# Patient Record
Sex: Female | Born: 1935 | Race: White | Hispanic: No | Marital: Married | Smoking: Never smoker
Health system: Southern US, Academic
[De-identification: ages and names within clinical notes are randomized; demographics above are authoritative.]

## PROBLEM LIST (undated history)

## (undated) DIAGNOSIS — I10 Essential (primary) hypertension: Secondary | ICD-10-CM

## (undated) DIAGNOSIS — M1990 Unspecified osteoarthritis, unspecified site: Secondary | ICD-10-CM

## (undated) HISTORY — DX: Essential (primary) hypertension: I10

## (undated) HISTORY — PX: KNEE SURGERY: SHX244

## (undated) HISTORY — DX: Unspecified osteoarthritis, unspecified site: M19.90

## (undated) SURGERY — IR STROKE THERAPY
Anesthesia: General | Laterality: Right

---

## 1994-05-27 ENCOUNTER — Ambulatory Visit (HOSPITAL_COMMUNITY): Payer: Self-pay

## 2015-06-18 IMAGING — US US EXTREM LOW VENOUS*L*
1 series · 14 of 24 positions shown · non-contrast
Comparison: None.

CLINICAL DATA: Left ankle pain and swelling

EXAM:
Left LOWER EXTREMITY VENOUS DOPPLER ULTRASOUND
TECHNIQUE: Gray-scale sonography with graded compression, as well as color
Doppler and duplex ultrasound, were performed to evaluate the deep
venous system from the level of the common femoral vein through the
popliteal and proximal calf veins. Spectral Doppler was utilized to
evaluate flow at rest and with distal augmentation maneuvers.

[Series 1: us extrem low venous*left* · 14 of 26 slices shown]
[im 1/26]
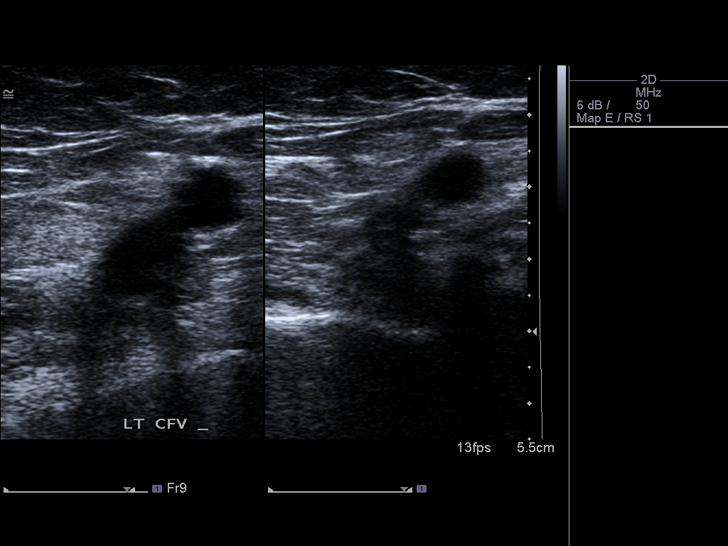
[im 3/26]
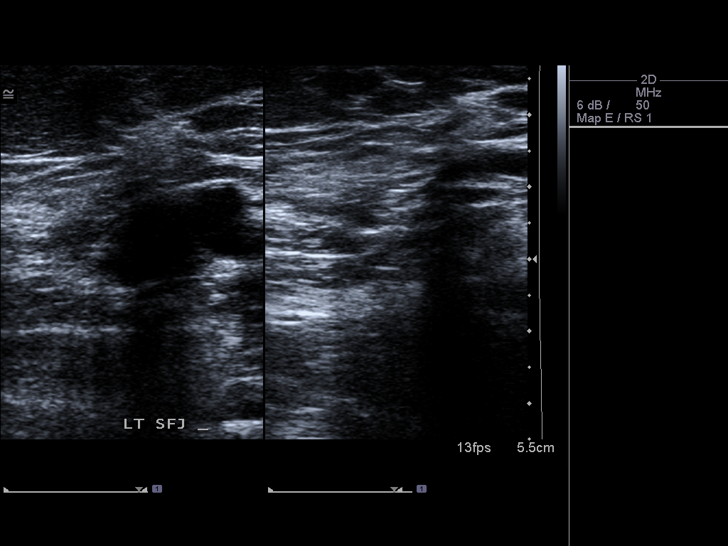
[im 5/26]
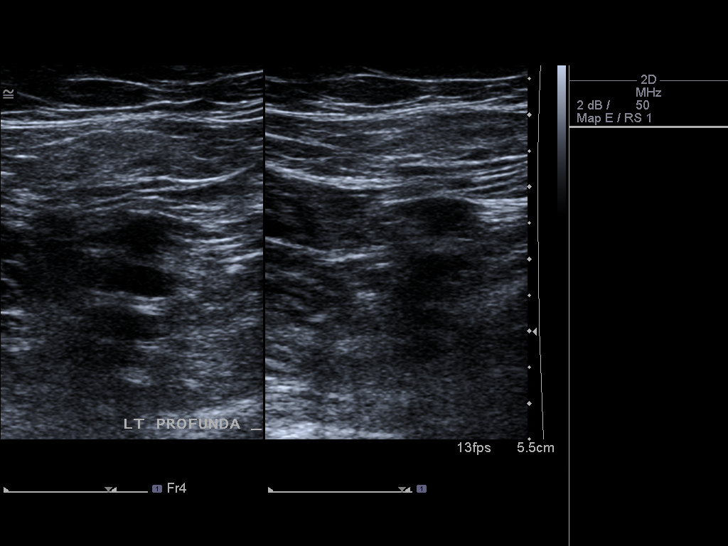
[im 7/26]
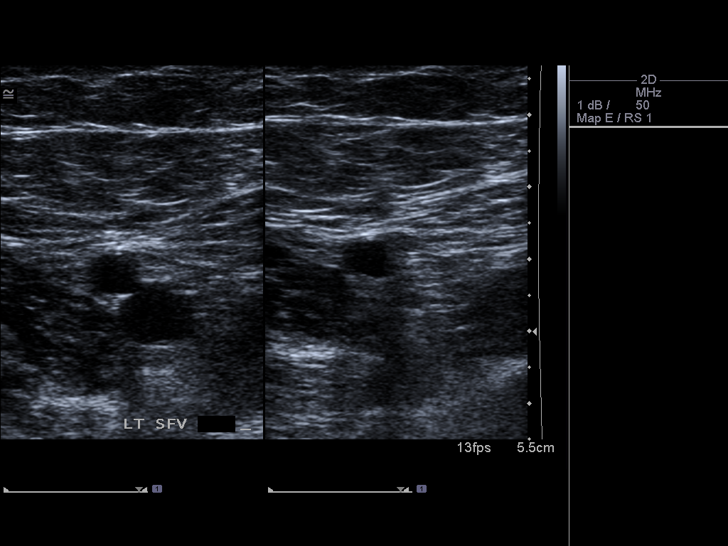
[im 8/26]
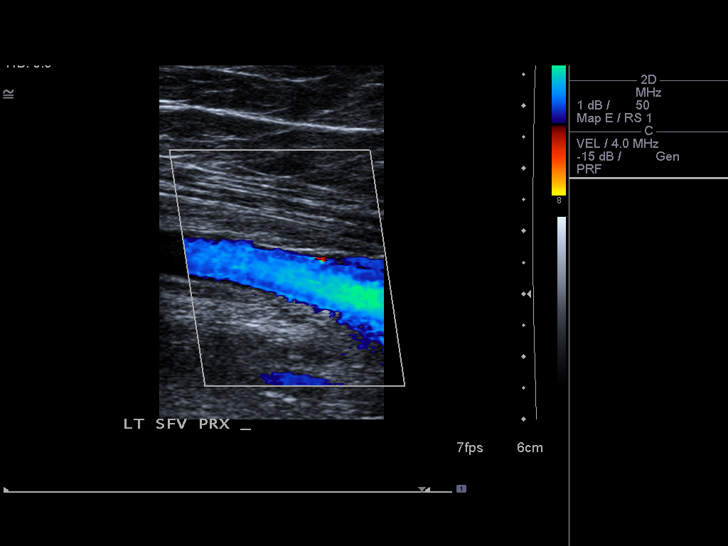
[im 10/26]
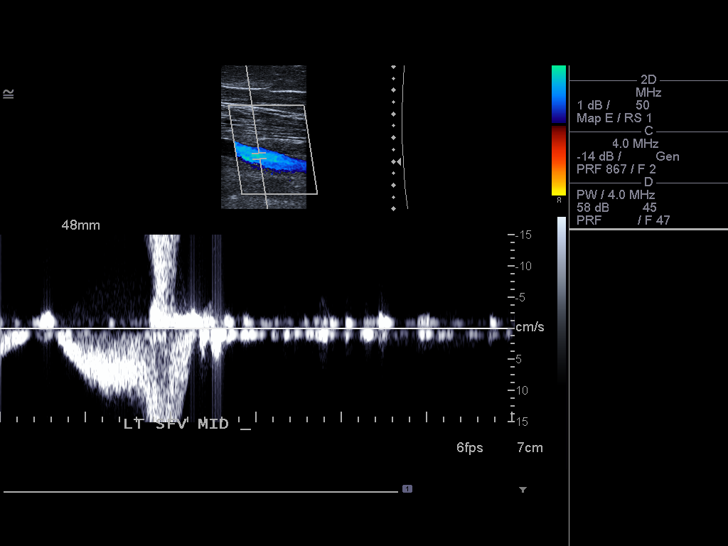
[im 12/26]
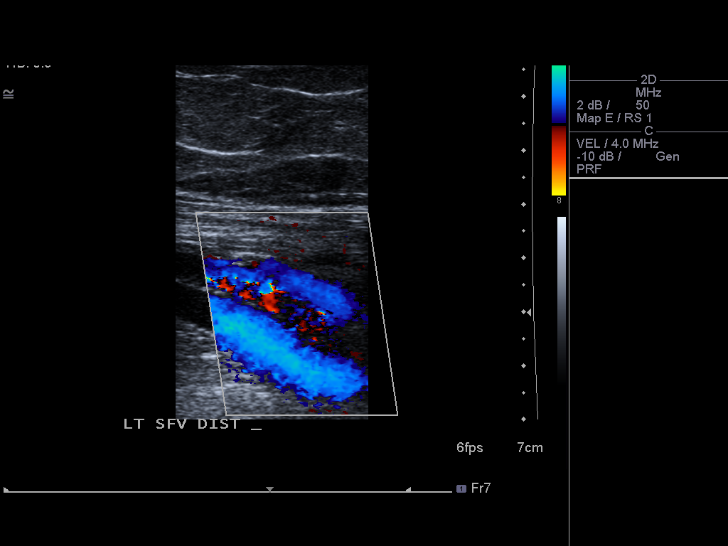
[im 14/26]
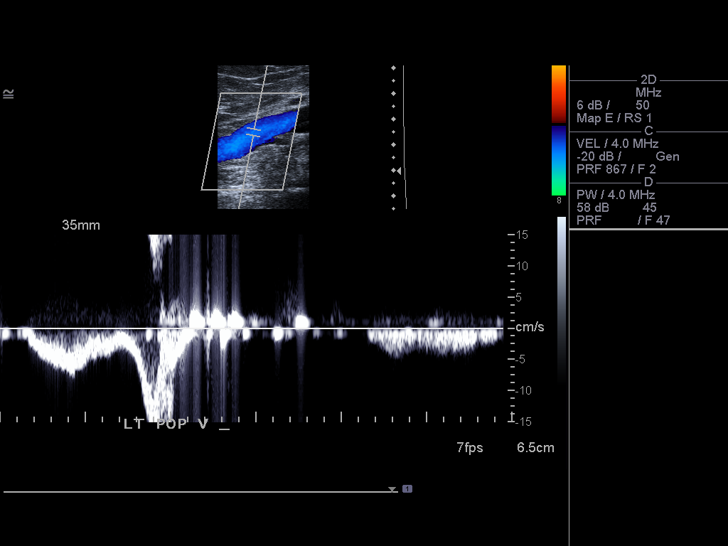
[im 16/26]
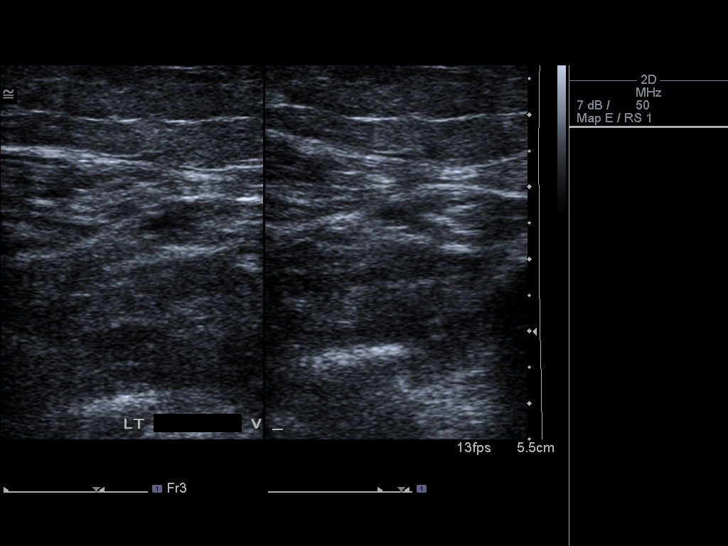
[im 18/26]
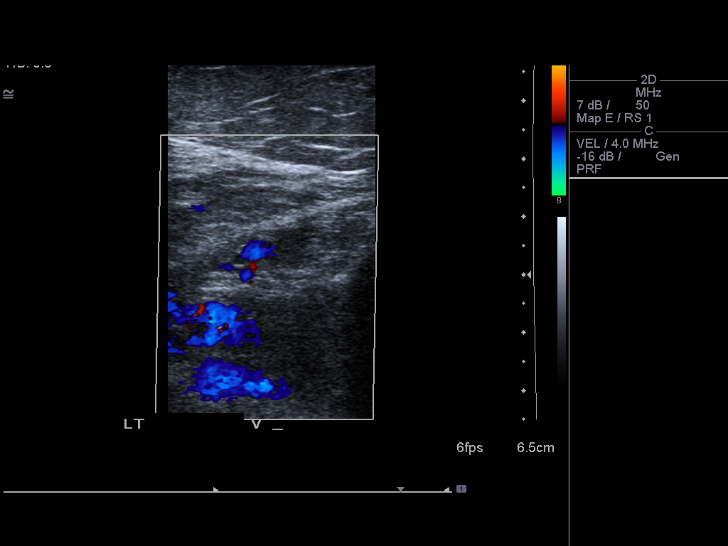
[im 20/26]
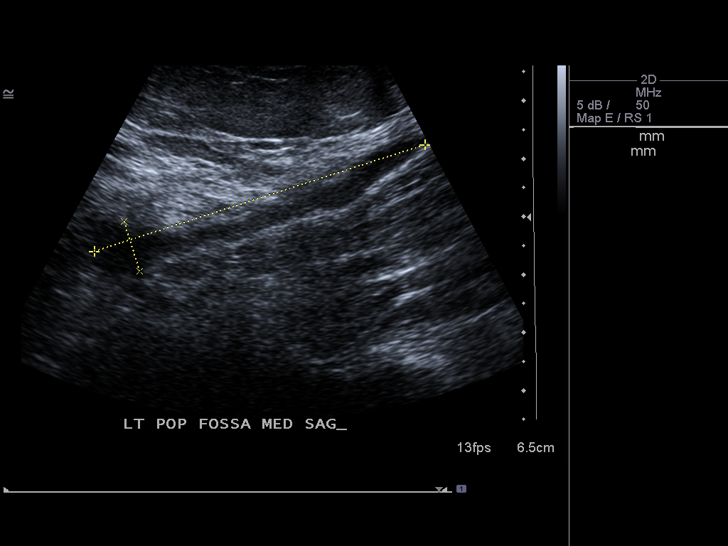
[im 21/26]
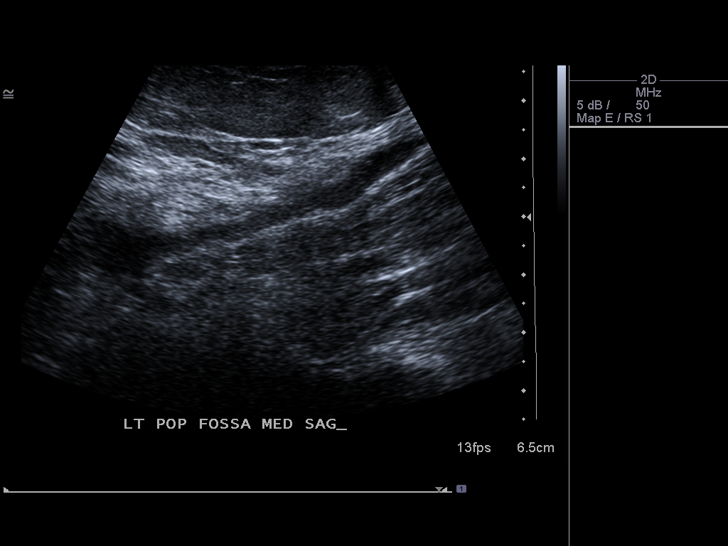
[im 23/26]
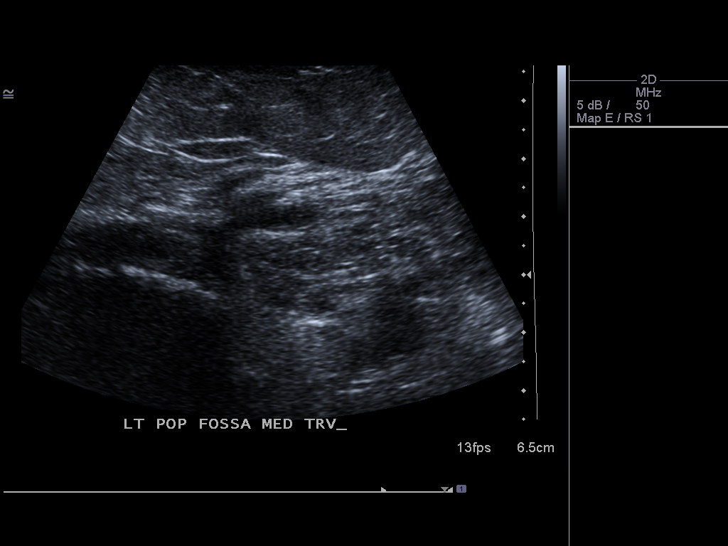
[im 26/26]
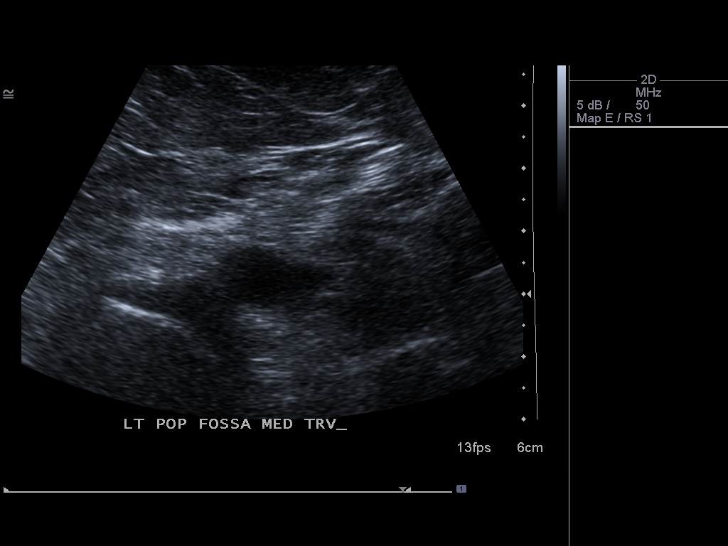

[14 of 24 positions shown; findings below may reference images not displayed]

FINDINGS: Thrombus within deep veins:  None visualized.

Compressibility of deep veins:  Normal.

Duplex waveform respiratory phasicity:  Normal.

Duplex waveform response to augmentation:  Normal.

Venous reflux:  None visualized.

Other findings: The greater saphenous vein has been stripped
previously stripped. Additionally a hypoechoic area is noted in the
popliteal fossa consistent with a popliteal cyst.
IMPRESSION: No evidence of deep venous thrombosis.

Prior greater saphenous vein stripping.

6 cm collection in the popliteal fossa consistent with a popliteal
cyst.

## 2015-06-18 IMAGING — CR DG ANKLE COMPLETE 3+V*L*
3 series · 3 of 3 positions shown · non-contrast
Comparison: None.

CLINICAL DATA: Ankle swelling

EXAM:
LEFT ANKLE COMPLETE - 3+ VIEW

[t ankle joint ap left]
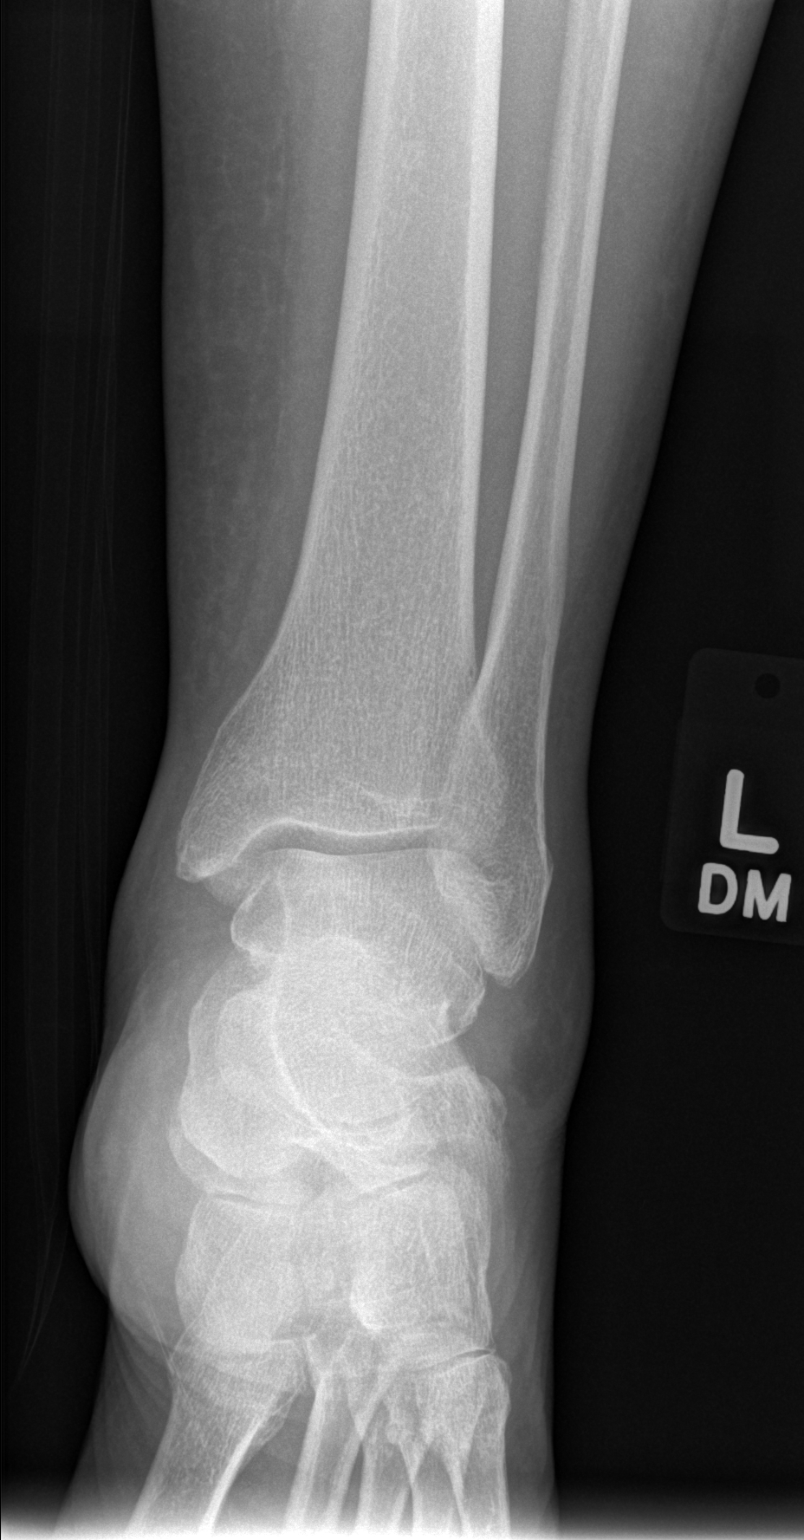

[t ankle joint oblique left]
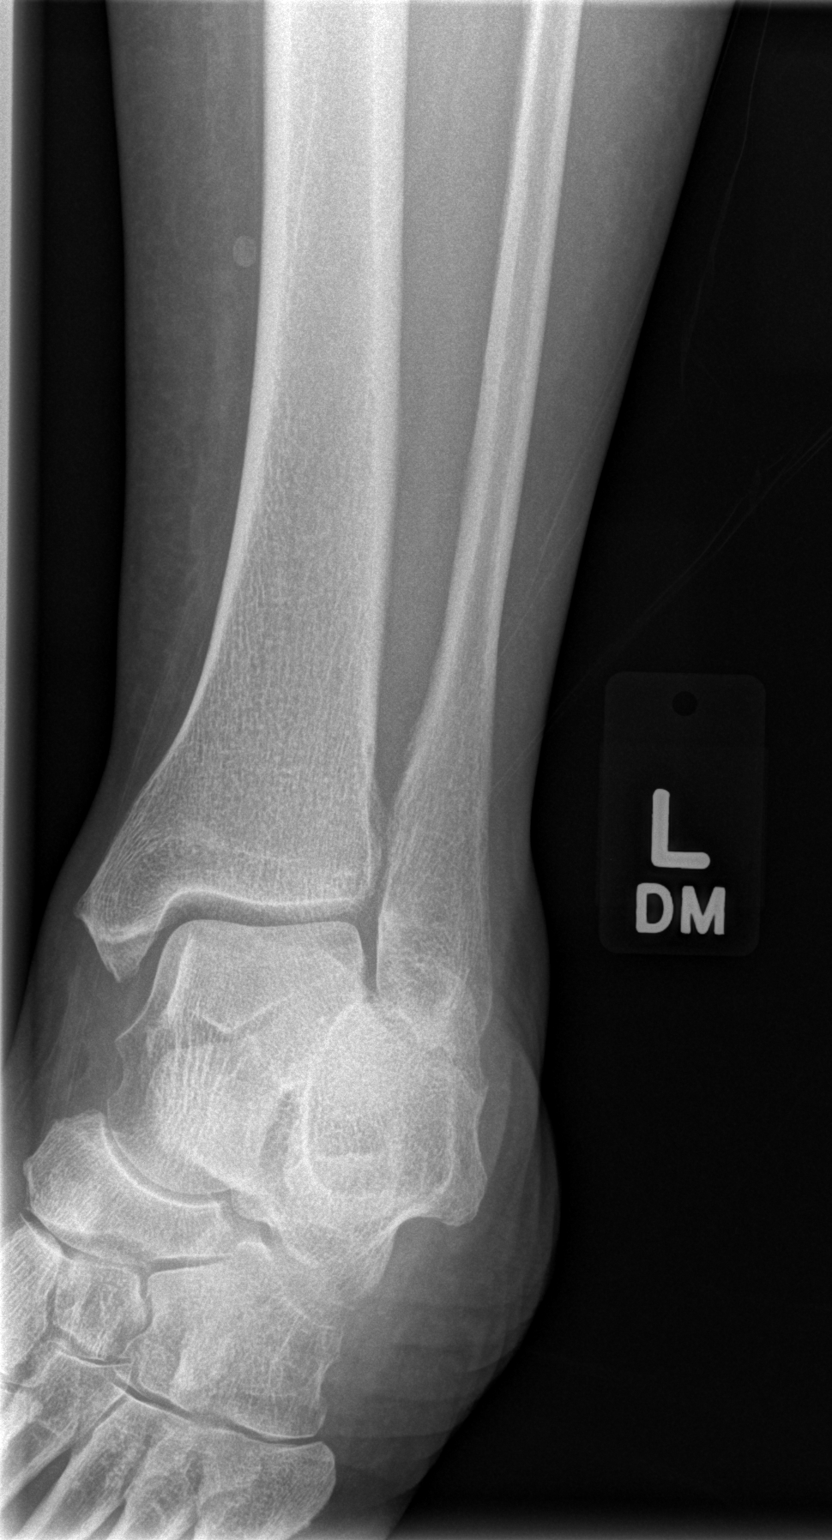

[t ankle joint lat left]
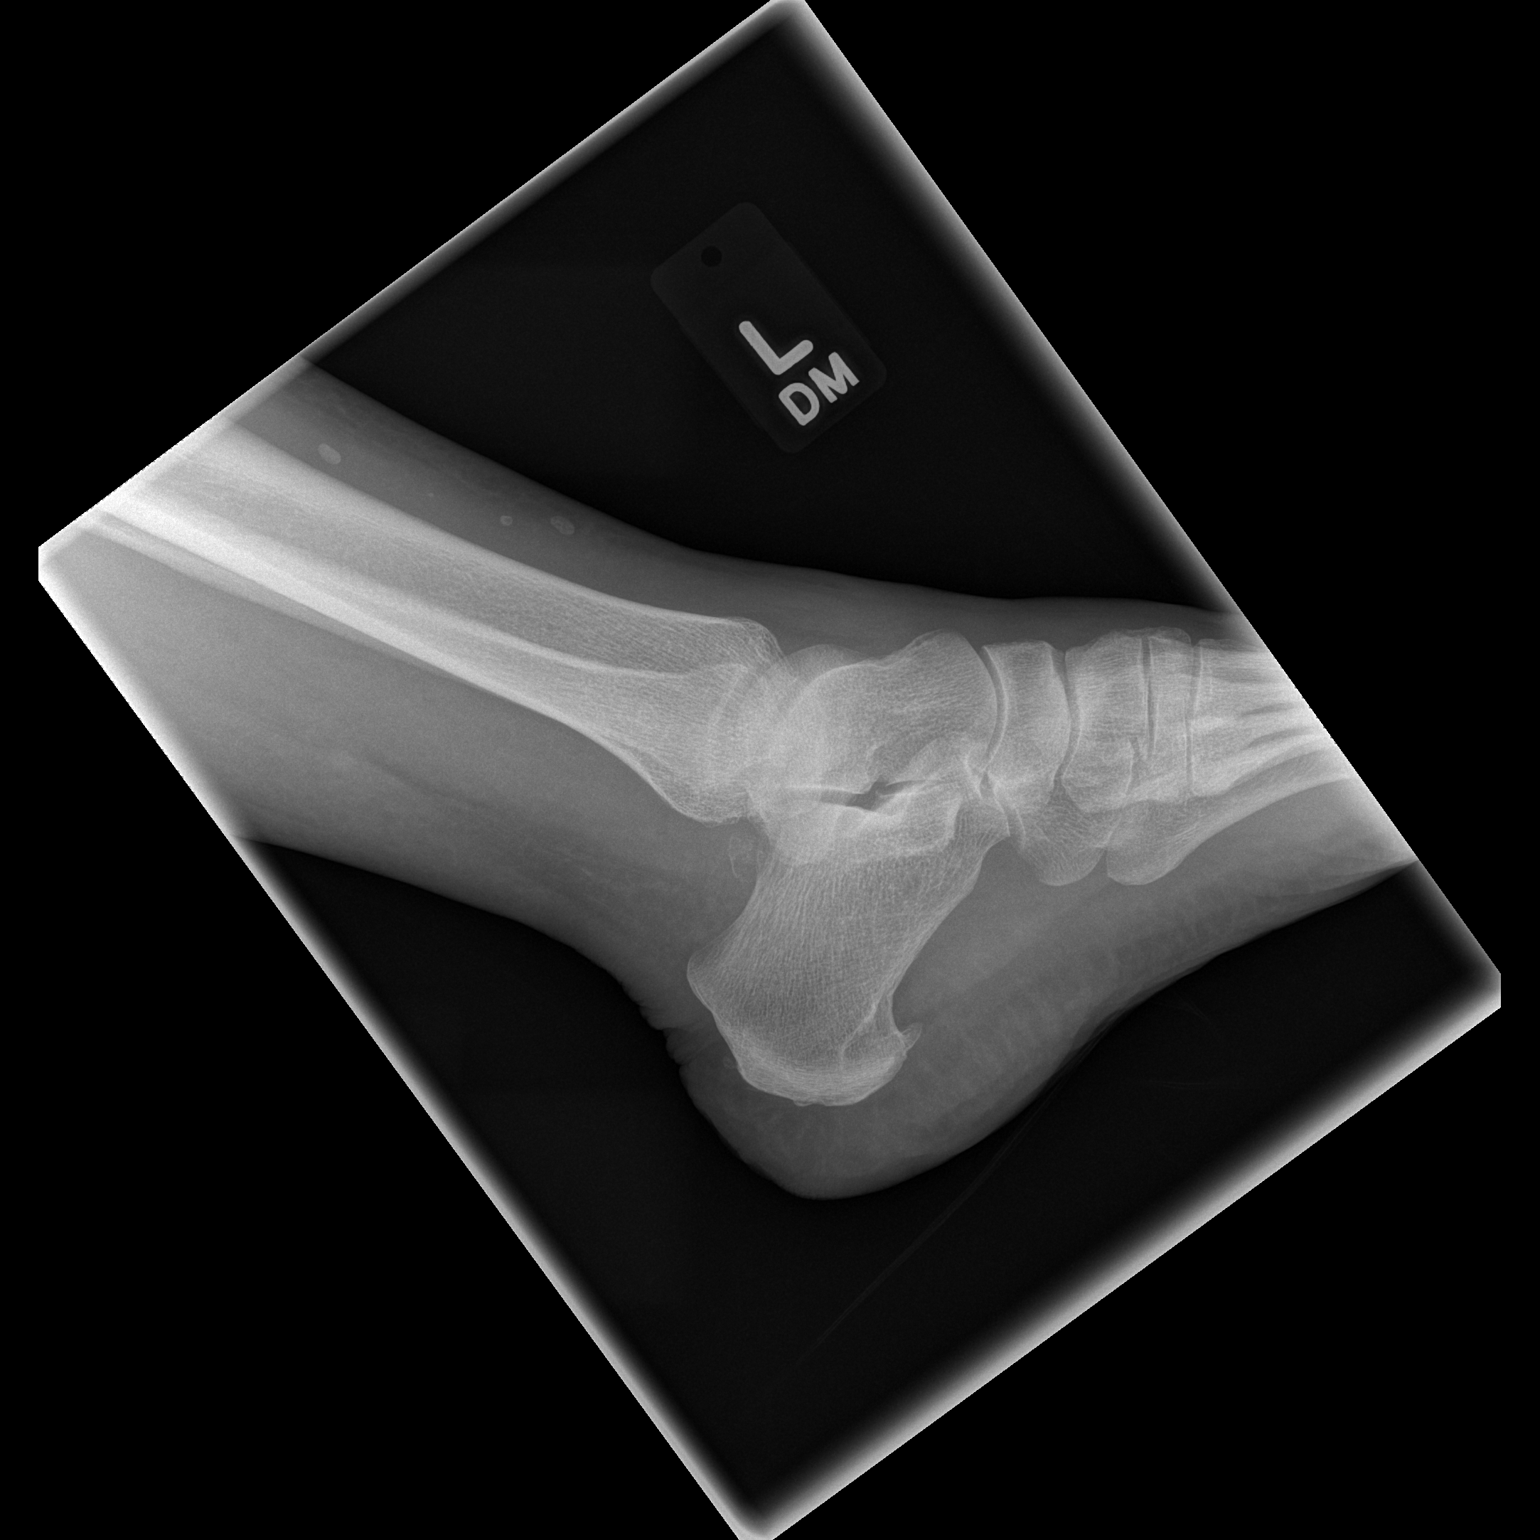

[3 of 3 positions shown; findings below may reference images not displayed]

FINDINGS: No acute fracture dislocation is noted. Generalized soft tissue
swelling is noted. Some phleboliths are seen within the venous
system.
IMPRESSION: No acute bony abnormality.  Soft tissue swelling is seen.

## 2019-09-14 ENCOUNTER — Other Ambulatory Visit (HOSPITAL_COMMUNITY): Payer: Self-pay | Admitting: Internal Medicine

## 2019-09-14 ENCOUNTER — Other Ambulatory Visit: Payer: Self-pay

## 2019-09-14 ENCOUNTER — Ambulatory Visit
Admission: RE | Admit: 2019-09-14 | Discharge: 2019-09-14 | Disposition: A | Discharge status: Home / Self Care | Payer: Medicare (Managed Care) | Source: Ambulatory Visit | Attending: Internal Medicine | Admitting: Internal Medicine

## 2019-09-14 DIAGNOSIS — R0789 Other chest pain: Secondary | ICD-10-CM

## 2019-11-01 ENCOUNTER — Other Ambulatory Visit (HOSPITAL_COMMUNITY): Payer: Self-pay | Admitting: Internal Medicine

## 2019-11-01 DIAGNOSIS — Z1231 Encounter for screening mammogram for malignant neoplasm of breast: Secondary | ICD-10-CM

## 2019-12-29 ENCOUNTER — Ambulatory Visit (HOSPITAL_BASED_OUTPATIENT_CLINIC_OR_DEPARTMENT_OTHER)
Admission: RE | Admit: 2019-12-29 | Discharge: 2019-12-29 | Disposition: A | Discharge status: Home / Self Care | Payer: Medicare (Managed Care) | Source: Ambulatory Visit | Attending: OBSTETRICS-GYNECOLOGY | Admitting: OBSTETRICS-GYNECOLOGY

## 2019-12-29 ENCOUNTER — Encounter (HOSPITAL_BASED_OUTPATIENT_CLINIC_OR_DEPARTMENT_OTHER): Payer: Self-pay

## 2019-12-29 ENCOUNTER — Other Ambulatory Visit (HOSPITAL_COMMUNITY): Payer: Self-pay | Admitting: OBSTETRICS-GYNECOLOGY

## 2019-12-29 ENCOUNTER — Other Ambulatory Visit: Payer: Self-pay

## 2019-12-29 ENCOUNTER — Ambulatory Visit
Admission: RE | Admit: 2019-12-29 | Discharge: 2019-12-29 | Disposition: A | Discharge status: Home / Self Care | Payer: Medicare (Managed Care) | Source: Ambulatory Visit | Attending: Internal Medicine | Admitting: Internal Medicine

## 2019-12-29 DIAGNOSIS — M810 Age-related osteoporosis without current pathological fracture: Secondary | ICD-10-CM | POA: Insufficient documentation

## 2019-12-29 DIAGNOSIS — Z1231 Encounter for screening mammogram for malignant neoplasm of breast: Secondary | ICD-10-CM | POA: Insufficient documentation

## 2019-12-29 DIAGNOSIS — M859 Disorder of bone density and structure, unspecified: Secondary | ICD-10-CM

## 2020-07-12 ENCOUNTER — Ambulatory Visit (HOSPITAL_COMMUNITY): Payer: Medicare (Managed Care) | Admitting: ORTHOPAEDIC SURGERY

## 2020-07-12 ENCOUNTER — Other Ambulatory Visit: Payer: Self-pay

## 2020-07-12 ENCOUNTER — Encounter (HOSPITAL_COMMUNITY): Payer: Self-pay | Admitting: ORTHOPAEDIC SURGERY

## 2020-07-12 ENCOUNTER — Ambulatory Visit
Admission: RE | Admit: 2020-07-12 | Discharge: 2020-07-12 | Disposition: A | Discharge status: Home / Self Care | Payer: Medicare (Managed Care) | Source: Ambulatory Visit | Attending: ORTHOPAEDIC SURGERY | Admitting: ORTHOPAEDIC SURGERY

## 2020-07-12 VITALS — Temp 96.4°F | Ht 64.0 in | Wt 164.0 lb

## 2020-07-12 DIAGNOSIS — M25569 Pain in unspecified knee: Secondary | ICD-10-CM

## 2020-07-12 DIAGNOSIS — M1712 Unilateral primary osteoarthritis, left knee: Secondary | ICD-10-CM

## 2020-07-12 DIAGNOSIS — M25562 Pain in left knee: Secondary | ICD-10-CM

## 2020-07-12 DIAGNOSIS — Z96651 Presence of right artificial knee joint: Secondary | ICD-10-CM | POA: Insufficient documentation

## 2020-07-12 MED ORDER — BETAMETHASONE ACETATE AND SODIUM PHOS 6 MG/ML SUSPENSION FOR INJECTION
6.00 mg | INTRAMUSCULAR | 0 refills | Status: AC
Start: 2020-07-12 — End: ?

## 2020-07-12 NOTE — Procedures (Signed)
ORTHOPEDICS, [facility]  [address] [place] [address]  Operated by [facility]  Procedure Note    Name: Bridget [name] MRN:  [id number]   Date: 07/12/2020 Age: 86 y.o.       Large Joint Arthrocentesis: L knee on 07/12/2020 1:52 PM  Indications: pain  Details: 21 G needle, lateral approach    The knees were sequentially identified.  Starting with the left the area affected was prepped in a sterile fashion using standard technique.  After this the knee was injected.  A sterile bandage was then applied.  The patient tolerated the procedure well.     After the patient said procedure they were instructed to rest and ice the affected extremities.  Ice to be applied 20 minutes on than 40 minutes off for the next several hours.  Patient advised to contact the office if there is any significant redness, drainage, swelling or other abnormalities.  Both our direct line and a line for service was given.  Consent was obtained for the procedure by the patient.    Medication: Celestone, 6mg   Route: Needle injection, Interarticular  Site: Left knee    NDC:  Manufacturer: Pfizer  Supplier: Clinic              [id number], MD

## 2020-07-12 NOTE — Nursing Note (Signed)
07/12/20 1300   Medication Administration   Medication  Celestone   LOT # [id number]   Expiration date 12/26/21

## 2020-07-12 NOTE — Progress Notes (Signed)
[facility]  DEPARTMENT OF ORTHOPAEDICS    Clinic Note       Name: Bridget [name]  MRN: [id number]    Patient ID: Bridget [name] is an 86 y.o. female.  Chief Complaint:     Chief Complaint   Patient presents with   . Knee Pain        HPI/Subjective:    Patient is a very pleasant 86 year old that had undergone a total knee arthroplasty at [facility] with an all poly base plate and a posterior stabilized femoral component.  She did very well with that.  She is thinking she may want to her other side.  At this point she is hoping just for an injection.  Walking tolerance is down to about 15 or 20 minutes.  She has pain with activity and at rest.    She describes the pain as moderately dull with sharp pains during activity.  The pain can be rated at a 4/10.  It is improved by rest, ice, or sleep.  There is a post activity pain - but it is less significant.  Pain is improved by over-the-counter medications.  Pain is made worse by significant or forceful activity. It does occur during ADL and hom eactivities.  The pain does vary from day-to-day.        Past Medical History:   Diagnosis Date   . Arthritis    . HTN (hypertension)          Past Surgical History:   Procedure Laterality Date   . KNEE SURGERY           Family Medical History:     Problem Relation (Age of Onset)    No Known Problems Mother, Father, Sister, Brother, Maternal Grandmother, Maternal Grandfather, Paternal Grandmother, Paternal Grandfather, Daughter, Son, Maternal Aunt, Maternal Uncle, Paternal Aunt, Paternal Uncle, Other           Current Outpatient Medications   Medication Sig   . amLODIPine (NORVASC) 5 mg Oral Tablet TAKE 1 TABLET BY MOUTH EVERY DAY. STOP TAKING THE AMLODIPINE 2.5MG    . betamethasone (CELESTONE SOLUSPAN) 6 mg/mL Injection Suspension Inject 1 mL (6 mg total) as directed Every 24 hours   . levothyroxine (SYNTHROID) 100 mcg Oral Tablet TAKE 1 TABLET BY  MOUTH FIRST THING IN THE MORNING ONE HOUR BEFORE BREAKFAST ON AN EMPTY STOMACH   . pravastatin (PRAVACHOL) 20 mg Oral Tablet    . traZODone (DESYREL) 50 mg Oral Tablet Take 50 mg by mouth Every night     No Known Allergies   Social History     Substance and Sexual Activity   Alcohol Use None     Social History     Tobacco Use   Smoking Status Never Smoker   Smokeless Tobacco Never Used     Social History     Substance and Sexual Activity   Drug Use Not on file       Review of Systems:  Constitutional: Negative for activity change, appetite change, diaphoresis and fatigue.   HENT: Negative for hearing loss, nosebleeds, postnasal drip, rhinorrhea, sinus pain and sore throat.    Eyes: Negative for photophobia, discharge and redness.   Respiratory: Negative for cough, choking, chest tightness, shortness of breath and wheezing.    Cardiovascular: Negative for chest pain, palpitations and leg swelling.   Gastrointestinal: Negative for abdominal distention, constipation, diarrhea, nausea and vomiting.   Genitourinary: Negative for difficulty urinating,  dysuria, hematuria and urgency.   Neurological: Negative for dizziness, seizures, light-headedness and numbness.   Psychiatric/Behavioral: Negative for agitation, confusion, dysphoric mood and sleep disturbance.     Physical Exam  Vitals:    07/12/20 1303   Temp: 35.8 C (96.4 F)   Weight: 74.4 kg (164 lb)   Height: 1.626 m (5\' 4" )   BMI: 28.21      Constitutional:       General: She is not in acute distress.     Appearance: Normal appearance. She is not ill-appearing or diaphoretic.   Eyes:      Extraocular Movements: Extraocular movements intact.      Pupils: Pupils are equal, round, and reactive to light.   Cardiovascular:      Rate and Rhythm: Normal rate and regular rhythm.   Pulmonary:      Effort: Pulmonary effort is normal.      Breath sounds: Normal breath sounds.   Abdominal:      General: Abdomen is flat. There is no distension.      Palpations: Abdomen is soft.       Tenderness: There is no abdominal tenderness.   Skin:     Coloration: Skin is not jaundiced.      Findings: No Rash. No lesion.   Neurological:      Mental Status: She is alert.   Musculoskeletal:      MSK:  Ipsilateral Hip:   Inspection:  No ecchymosis, no effusion, no deformities, no redness, no swelling  Palpation:  No tenderness in groin, no tenderness on bursa.  Range of motion: ROM non tender. FF 120, IR,15, ER 10 no flexion contracture, no LLD  Strength:  Normal  LS-spine:  No tenderness on spines or SI joints.  Neurologic examination normal.  Gait:  No pain with weight-bearing.  Antalgic gait noted.    Right Knee:    Lower leg:  No calf tenderness, no redness, no swelling, no warmth, nontender, mild Baker cyst  Inspection:  No ecchymosis, no erythema on the joint, no flexion deformity, mild Baker's cyst  Palpation:  Tenderness on medial joint line-left  Collateral ligaments:  Intact medially and laterally.  Slight medial pseudolaxity-left  Range of motion:  Slight limitation to range of motion.  Flexion 0 to 130 right, 0-125 left.  Patella sits centrally.  Patellofemoral joint:  Crepitation with motion.  Slight tenderness.  Lachman's:  Negative     Bilat Ankles:  Inspection:  No ecchymosis, no erythema, no deformity.  Palpation:  Non-tender  Anterior stability:  Anterior drawer negative.  No significant tenderness with eversion inversion  Vascular:  Pulses palpable.  Capillary refill normal.    Assessment:  1. Primary osteoarthritis of left knee    2. Knee pain, unspecified chronicity, unspecified laterality    3. History of total right knee replacement        Stable well-functioning right tota knee arthroplasty.  Left knee arthritis.  We will inject the left knee.     Plan:  Orders Placed This Encounter   . XR KNEE LEFT 2 VIEW   . betamethasone (CELESTONE SOLUSPAN) 6 mg/mL Injection Suspension         , 07/12/2020        07/14/2020, MD  [facility] [address]  DR  [place]

## 2020-07-19 DIAGNOSIS — M1712 Unilateral primary osteoarthritis, left knee: Secondary | ICD-10-CM

## 2020-12-02 ENCOUNTER — Other Ambulatory Visit (HOSPITAL_COMMUNITY): Payer: Self-pay | Admitting: INTERNAL MEDICINE

## 2020-12-02 DIAGNOSIS — Z1231 Encounter for screening mammogram for malignant neoplasm of breast: Secondary | ICD-10-CM

## 2021-01-13 ENCOUNTER — Ambulatory Visit (HOSPITAL_BASED_OUTPATIENT_CLINIC_OR_DEPARTMENT_OTHER): Payer: Self-pay

## 2021-01-21 ENCOUNTER — Ambulatory Visit (HOSPITAL_BASED_OUTPATIENT_CLINIC_OR_DEPARTMENT_OTHER): Payer: Self-pay

## 2021-01-31 ENCOUNTER — Encounter (HOSPITAL_BASED_OUTPATIENT_CLINIC_OR_DEPARTMENT_OTHER): Payer: Self-pay

## 2021-01-31 ENCOUNTER — Other Ambulatory Visit: Payer: Self-pay

## 2021-01-31 ENCOUNTER — Inpatient Hospital Stay
Admission: RE | Admit: 2021-01-31 | Discharge: 2021-01-31 | Disposition: A | Discharge status: Home / Self Care | Payer: Medicare (Managed Care) | Source: Ambulatory Visit | Attending: INTERNAL MEDICINE | Admitting: INTERNAL MEDICINE

## 2021-01-31 DIAGNOSIS — Z1231 Encounter for screening mammogram for malignant neoplasm of breast: Secondary | ICD-10-CM | POA: Insufficient documentation

## 2021-10-06 ENCOUNTER — Emergency Department
Admission: EM | Admit: 2021-10-06 | Discharge: 2021-10-06 | Disposition: A | Discharge status: Home / Self Care | Payer: Medicare (Managed Care) | Attending: Emergency Medicine | Admitting: Emergency Medicine

## 2021-10-06 ENCOUNTER — Emergency Department (HOSPITAL_COMMUNITY): Payer: Medicare (Managed Care)

## 2021-10-06 ENCOUNTER — Other Ambulatory Visit: Payer: Self-pay

## 2021-10-06 ENCOUNTER — Encounter (HOSPITAL_COMMUNITY): Payer: Self-pay

## 2021-10-06 ENCOUNTER — Inpatient Hospital Stay (HOSPITAL_COMMUNITY): Payer: Medicare (Managed Care)

## 2021-10-06 DIAGNOSIS — R059 Cough, unspecified: Secondary | ICD-10-CM | POA: Insufficient documentation

## 2021-10-06 DIAGNOSIS — U071 COVID-19: Secondary | ICD-10-CM | POA: Insufficient documentation

## 2021-10-06 DIAGNOSIS — R110 Nausea: Secondary | ICD-10-CM | POA: Insufficient documentation

## 2021-10-06 LAB — CBC WITH DIFF
BASOPHIL #: <0.1 10*3/uL (ref <=0.20)
BASOPHIL %: 1 %
EOSINOPHIL #: 0.12 10*3/uL (ref <=0.50)
EOSINOPHIL %: 2 %
HCT: 44.3 % (ref 34.8–46.0)
HGB: 14.1 g/dL (ref 11.5–16.0)
IMMATURE GRANULOCYTE #: <0.1 10*3/uL (ref <0.10)
IMMATURE GRANULOCYTE %: 0 % (ref 0–1)
LYMPHOCYTE #: 1.87 10*3/uL (ref 1.00–4.80)
LYMPHOCYTE %: 26 %
MCH: 28.1 pg (ref 26.0–32.0)
MCHC: 31.8 g/dL (ref 31.0–35.5)
MCV: 88.2 fL (ref 78.0–100.0)
MONOCYTE #: 0.96 10*3/uL (ref 0.20–1.10)
MONOCYTE %: 13 %
MPV: 9.1 fL (ref 8.7–12.5)
NEUTROPHIL #: 4.17 10*3/uL (ref 1.50–7.70)
NEUTROPHIL %: 58 %
PLATELETS: 179 10*3/uL (ref 150–400)
RBC: 5.02 10*6/uL (ref 3.85–5.22)
RDW-CV: 13.5 % (ref 11.5–15.5)
WBC: 7.2 10*3/uL (ref 3.7–11.0)

## 2021-10-06 LAB — COMPREHENSIVE METABOLIC PANEL, NON-FASTING
ALBUMIN: 3.5 g/dL (ref 3.4–4.8)
ALKALINE PHOSPHATASE: 84 U/L (ref 55–145)
ALT (SGPT): 21 U/L (ref 8–22)
ANION GAP: 11 mmol/L (ref 4–13)
AST (SGOT): 20 U/L (ref 8–45)
BILIRUBIN TOTAL: 0.3 mg/dL (ref 0.3–1.3)
BUN/CREA RATIO: 13 (ref 6–22)
BUN: 14 mg/dL (ref 8–25)
CALCIUM: 9.5 mg/dL (ref 8.6–10.3)
CHLORIDE: 106 mmol/L (ref 96–111)
CO2 TOTAL: 28 mmol/L (ref 23–31)
CREATININE: 1.04 mg/dL (ref 0.60–1.05)
ESTIMATED GFR: 53 mL/min/BSA — ABNORMAL LOW (ref >=60)
GLUCOSE: 107 mg/dL (ref 65–125)
POTASSIUM: 4.3 mmol/L (ref 3.5–5.1)
PROTEIN TOTAL: 7.2 g/dL (ref 6.0–8.0)
SODIUM: 145 mmol/L (ref 136–145)

## 2021-10-06 LAB — COVID-19 ~~LOC~~ MOLECULAR LAB TESTING
INFLUENZA VIRUS TYPE A: NOT DETECTED
INFLUENZA VIRUS TYPE B: NOT DETECTED
SARS-COV-2: DETECTED — CR

## 2021-10-06 LAB — URINALYSIS, MICROSCOPIC: BACTERIA: NEGATIVE /hpf

## 2021-10-06 LAB — URINALYSIS, MACRO/MICRO
BILIRUBIN: NEGATIVE mg/dL
GLUCOSE: NEGATIVE mg/dL
KETONES: NEGATIVE mg/dL
NITRITE: NEGATIVE
PH: 6 (ref 5.0–9.0)
PROTEIN: NEGATIVE mg/dL
SPECIFIC GRAVITY: 1.008 (ref 1.001–1.030)
UROBILINOGEN: 0.2 mg/dL (ref 0.2–1.0)

## 2021-10-06 MED ORDER — ONDANSETRON HCL (PF) 4 MG/2 ML INJECTION SOLUTION
4.0000 mg | INTRAMUSCULAR | Status: AC
Start: 2021-10-06 — End: 2021-10-06
  Administered 2021-10-06: 4 mg via INTRAVENOUS
  Filled 2021-10-06: qty 2

## 2021-10-06 NOTE — ED Triage Notes (Addendum)
Having nausea and not feeling well; took a home covid test and was positive; also having some of a cough

## 2021-10-06 NOTE — ED Provider Notes (Signed)
[facility] Emergency Department  ***This note is incomplete and should not be used for clinical decision making until this phrase is removed ***    Code Status:  ***    Chief Complaint:  Patient presents with     Chief Complaint   Patient presents with   . Covid-19 Positive     Nausea/cough          Pre-Hospital Interventions: ***      HPI:   Bridget [name], date of birth 08-Oct-1934, is a 87 y.o. female who presents to the Emergency Department via Car accompanied by *** and is alone*** with a chief complaint of ***      History:   The following were reviewed: Medical History  Surgical History  Family History  Social History      Vital Signs:  Pre-disposition vitals:  ED Triage Vitals   BP (Non-Invasive) 10/06/21 2000 (!) 150/72   Heart Rate 10/06/21 2000 70   Respiratory Rate 10/06/21 2000 17   Temperature 10/06/21 1958 36.5 C (97.7 F)   SpO2 10/06/21 2000 95 %   Weight 10/06/21 1958 79.9 kg (176 lb 2.4 oz)   Height 10/06/21 1958 1.626 m (5\' 4" )         PE: ***  Nursing notes and vital signs reviewed.  Constitutional: 87 y.o. female appears stated age, in *** distress, normal color.   HEENT:    Head:  Normocephalic and atraumatic.    Eyes:  EOMI, PERRL    Mouth/Throat:  Mucous membranes moist.    Neck:  Trachea midline. Neck supple.   Cardiovascular:  RRR, no murmur appreciated. Intact distal pulses.  Pulmonary/Chest:   No respiratory distress. Breath sounds clear and equal bilaterally. No wheezes or rales. No chest wall tenderness to palpation.   Abdominal:  BS +. Abdomen soft, no tenderness, rebound or guarding.  Back:  No midline spinal tenderness, no paraspinal tenderness, no CVA tenderness.           Musculoskeletal:  No edema, tenderness or deformity noted.  Skin:  Warm and dry.   Psychiatric:  Appropriate mood and affect for situation. Behavior is normal.   Neurological:  Patient keenly alert and responsive, facies symmetric, moving all extremities equally and fully, normal  gait      Orders:  Orders Placed This Encounter   . XR CHEST AP   . CBC/DIFF   . COMPREHENSIVE METABOLIC PANEL, NON-FASTING   . URINALYSIS WITH REFLEX MICROSCOPIC AND CULTURE IF POSITIVE   . COVID - 19 SCREENING - Symptomatic - PUI   . CBC WITH DIFF   . 97         ED Course/Medical Decision Making:  ED Course as of 10/06/21 2327   [facility] Oct 06, 2021   2232 Temperature: 36.5 C (97.7 F)   2233 BP (Non-Invasive)(!): 150/72   2233 Heart Rate: 70   2233 Respiratory Rate: 17   2233 SpO2: 95 %   2327 SARS CORONAVIRUS 2 (SARS-CoV-2)(!!): Detected   2327 XR CHEST AP  Normal apicolordotic chest.   2327 SpO2: 97 %     Bridget [name] is a 87 y.o. female who presents for evaluation of ***.  Independent historian(s): ***  Ddx includes, but is not limited to: ***  Appropriate labs/imaging ordered and reviewed/interpreted by myself during ED stay.  No results found for this visit on 10/06/21 (from the past 720 hour(s)).  ***  Results reviewed with the patient. I recommended ***. Return  to the ED as needed.       Medical Decision Making        Procedures:    None.***        CRITICAL CARE ATTESTATION: Total critical care time spent in direct care of this patient at high risk of *** based on presenting history/exam/and complaint or signs/symptoms/history elicited during initial evaluation or during course of ED care, including the initial evaluation and stabilization, care delivered throughout ED stay, review of data, re-examination, discussion with admitting and consulting services to arrange definitive care, discussion with patient and family members as appropriate regarding such care, documentation of such, and exclusive of any procedures performed, was *** minutes.      Clinical Impression   None         Disposition: Data Unavailable    Following the above history, physical exam, and studies, the patient was deemed stable and suitable for discharge. The patient was advised to return to the ED for any new  or worsening symptoms. Discharge medications, and follow-up instructions were discussed with the patient/patient's family in detail, who verbalize understanding. The patient/patient's family is in agreement and is comfortable with the plan of care.    New Prescriptions    No medications on file       Patient will be admitted to *** service for further evaluation and management.      Prior to disposition, care of Bridget [name] was checked out to Dr. Marland Kitchen at [facility] following a discussion of the patient's course. They were made aware of history/physical, relevant labs/imaging and pending studies.        This chart may have been completed after the conclusion of this patient's care due to the time constraints of simultaneous responsiltibites of direct patient care activities during the clinical shift in the emergency department.     This note was partially generated using MModal Fluency Direct system, and there may be some incorrect words, spellings, and punctuation that were not noted in checking the note before saving.     ***

## 2021-10-07 ENCOUNTER — Telehealth (HOSPITAL_COMMUNITY): Payer: Self-pay | Admitting: Emergency Medicine

## 2021-10-07 LAB — RED TOP TUBE

## 2021-10-07 LAB — BLUE TOP TUBE

## 2021-10-07 LAB — GOLD TOP TUBE

## 2021-10-07 MED ORDER — ONDANSETRON 4 MG DISINTEGRATING TABLET
4.00 mg | ORAL_TABLET | Freq: Three times a day (TID) | ORAL | 0 refills | Status: AC | PRN
Start: 2021-10-07 — End: ?

## 2021-10-08 LAB — URINE CULTURE,ROUTINE: URINE CULTURE: 70000

## 2021-11-03 ENCOUNTER — Other Ambulatory Visit (HOSPITAL_COMMUNITY): Payer: Self-pay | Admitting: OBSTETRICS-GYNECOLOGY

## 2021-11-03 DIAGNOSIS — M8588 Other specified disorders of bone density and structure, other site: Secondary | ICD-10-CM

## 2021-11-03 DIAGNOSIS — Z1231 Encounter for screening mammogram for malignant neoplasm of breast: Secondary | ICD-10-CM

## 2022-02-06 ENCOUNTER — Ambulatory Visit (HOSPITAL_BASED_OUTPATIENT_CLINIC_OR_DEPARTMENT_OTHER): Payer: Self-pay

## 2022-05-13 ENCOUNTER — Inpatient Hospital Stay (HOSPITAL_BASED_OUTPATIENT_CLINIC_OR_DEPARTMENT_OTHER)
Admission: RE | Admit: 2022-05-13 | Discharge: 2022-05-13 | Disposition: A | Discharge status: Home / Self Care | Payer: Medicare (Managed Care) | Source: Ambulatory Visit | Attending: OBSTETRICS-GYNECOLOGY | Admitting: OBSTETRICS-GYNECOLOGY

## 2022-05-13 ENCOUNTER — Other Ambulatory Visit: Payer: Self-pay

## 2022-05-13 ENCOUNTER — Encounter (HOSPITAL_BASED_OUTPATIENT_CLINIC_OR_DEPARTMENT_OTHER): Payer: Self-pay

## 2022-05-13 ENCOUNTER — Inpatient Hospital Stay
Admission: RE | Admit: 2022-05-13 | Discharge: 2022-05-13 | Disposition: A | Discharge status: Home / Self Care | Payer: Medicare (Managed Care) | Source: Ambulatory Visit | Attending: OBSTETRICS-GYNECOLOGY | Admitting: OBSTETRICS-GYNECOLOGY

## 2022-05-13 DIAGNOSIS — Z1231 Encounter for screening mammogram for malignant neoplasm of breast: Secondary | ICD-10-CM

## 2022-05-13 DIAGNOSIS — M8588 Other specified disorders of bone density and structure, other site: Secondary | ICD-10-CM | POA: Insufficient documentation

## 2022-08-24 ENCOUNTER — Other Ambulatory Visit (HOSPITAL_COMMUNITY): Payer: Self-pay | Admitting: Family

## 2022-08-24 DIAGNOSIS — R413 Other amnesia: Secondary | ICD-10-CM

## 2022-10-21 ENCOUNTER — Other Ambulatory Visit (HOSPITAL_COMMUNITY): Payer: Medicare (Managed Care)

## 2022-10-21 ENCOUNTER — Inpatient Hospital Stay (HOSPITAL_BASED_OUTPATIENT_CLINIC_OR_DEPARTMENT_OTHER)
Admission: RE | Admit: 2022-10-21 | Discharge: 2022-10-21 | Disposition: A | Discharge status: Home / Self Care | Payer: Medicare (Managed Care) | Source: Ambulatory Visit | Attending: Family | Admitting: Family

## 2022-10-21 ENCOUNTER — Other Ambulatory Visit: Payer: Self-pay

## 2022-10-21 DIAGNOSIS — R413 Other amnesia: Secondary | ICD-10-CM | POA: Insufficient documentation

## 2022-10-21 DIAGNOSIS — Z01812 Encounter for preprocedural laboratory examination: Secondary | ICD-10-CM | POA: Insufficient documentation

## 2022-10-21 DIAGNOSIS — I6389 Other cerebral infarction: Secondary | ICD-10-CM

## 2022-10-21 LAB — BUN: BUN: 12 mg/dL (ref 8–25)

## 2022-10-21 LAB — CREATININE WITH EGFR
CREATININE: 1 mg/dL (ref 0.60–1.05)
ESTIMATED GFR - FEMALE: 55 mL/min/BSA — ABNORMAL LOW (ref >=60)

## 2022-10-21 MED ORDER — GADOBENATE DIMEGLUMINE 529 MG/ML(0.1 MMOL/0.2 ML) INTRAVENOUS SOLUTION
10.0000 mL | INTRAVENOUS | Status: AC
Start: 2022-10-21 — End: 2022-10-21
  Administered 2022-10-21: 10 mL via INTRAVENOUS

## 2022-10-22 ENCOUNTER — Emergency Department (HOSPITAL_COMMUNITY): Payer: Medicare (Managed Care)

## 2022-10-22 ENCOUNTER — Encounter (HOSPITAL_COMMUNITY): Payer: Self-pay

## 2022-10-22 ENCOUNTER — Inpatient Hospital Stay (HOSPITAL_COMMUNITY): Payer: Medicare (Managed Care)

## 2022-10-22 ENCOUNTER — Encounter (HOSPITAL_COMMUNITY): Payer: Self-pay | Admitting: Radiology

## 2022-10-22 ENCOUNTER — Inpatient Hospital Stay
Admission: EM | Admit: 2022-10-22 | Discharge: 2022-10-24 | DRG: 066 | Disposition: A | Discharge status: Home / Self Care | Payer: Medicare (Managed Care) | Source: Ambulatory Visit

## 2022-10-22 ENCOUNTER — Other Ambulatory Visit: Payer: Self-pay

## 2022-10-22 DIAGNOSIS — I10 Essential (primary) hypertension: Secondary | ICD-10-CM | POA: Diagnosis present

## 2022-10-22 DIAGNOSIS — Z7902 Long term (current) use of antithrombotics/antiplatelets: Secondary | ICD-10-CM

## 2022-10-22 DIAGNOSIS — R29704 NIHSS score 4: Secondary | ICD-10-CM | POA: Diagnosis not present

## 2022-10-22 DIAGNOSIS — I63541 Cerebral infarction due to unspecified occlusion or stenosis of right cerebellar artery: Principal | ICD-10-CM | POA: Diagnosis present

## 2022-10-22 DIAGNOSIS — E039 Hypothyroidism, unspecified: Secondary | ICD-10-CM | POA: Diagnosis present

## 2022-10-22 DIAGNOSIS — E785 Hyperlipidemia, unspecified: Secondary | ICD-10-CM | POA: Diagnosis present

## 2022-10-22 DIAGNOSIS — R29703 NIHSS score 3: Secondary | ICD-10-CM | POA: Diagnosis not present

## 2022-10-22 DIAGNOSIS — I6521 Occlusion and stenosis of right carotid artery: Secondary | ICD-10-CM | POA: Diagnosis present

## 2022-10-22 DIAGNOSIS — R4189 Other symptoms and signs involving cognitive functions and awareness: Secondary | ICD-10-CM

## 2022-10-22 DIAGNOSIS — Z7982 Long term (current) use of aspirin: Secondary | ICD-10-CM

## 2022-10-22 DIAGNOSIS — I4510 Unspecified right bundle-branch block: Secondary | ICD-10-CM

## 2022-10-22 DIAGNOSIS — R29702 NIHSS score 2: Secondary | ICD-10-CM | POA: Diagnosis not present

## 2022-10-22 DIAGNOSIS — I6523 Occlusion and stenosis of bilateral carotid arteries: Secondary | ICD-10-CM

## 2022-10-22 DIAGNOSIS — Z79899 Other long term (current) drug therapy: Secondary | ICD-10-CM

## 2022-10-22 DIAGNOSIS — R29701 NIHSS score 1: Secondary | ICD-10-CM | POA: Diagnosis present

## 2022-10-22 DIAGNOSIS — Z79890 Hormone replacement therapy (postmenopausal): Secondary | ICD-10-CM

## 2022-10-22 DIAGNOSIS — I639 Cerebral infarction, unspecified: Principal | ICD-10-CM | POA: Diagnosis present

## 2022-10-22 LAB — CBC WITH DIFF
BASOPHIL #: <0.1 10*3/uL (ref <=0.20)
BASOPHIL %: 1 %
EOSINOPHIL #: 0.24 10*3/uL (ref <=0.50)
EOSINOPHIL %: 3 %
HCT: 43.6 % (ref 34.8–46.0)
HGB: 14.3 g/dL (ref 11.5–16.0)
IMMATURE GRANULOCYTE #: <0.1 10*3/uL (ref <0.10)
IMMATURE GRANULOCYTE %: 0 % (ref 0.0–1.0)
LYMPHOCYTE #: 1.89 10*3/uL (ref 1.00–4.80)
LYMPHOCYTE %: 24 %
MCH: 28.4 pg (ref 26.0–32.0)
MCHC: 32.8 g/dL (ref 31.0–35.5)
MCV: 86.5 fL (ref 78.0–100.0)
MONOCYTE #: 0.6 10*3/uL (ref 0.20–1.10)
MONOCYTE %: 8 %
MPV: 9.2 fL (ref 8.7–12.5)
NEUTROPHIL #: 5.06 10*3/uL (ref 1.50–7.70)
NEUTROPHIL %: 64 %
PLATELETS: 245 10*3/uL (ref 150–400)
RBC: 5.04 10*6/uL (ref 3.85–5.22)
RDW-CV: 13.6 % (ref 11.5–15.5)
WBC: 7.9 10*3/uL (ref 3.7–11.0)

## 2022-10-22 LAB — TROPONIN-I: TROPONIN-I HS: 5.6 ng/L (ref <=14.0)

## 2022-10-22 LAB — COMPREHENSIVE METABOLIC PANEL, NON-FASTING
ALBUMIN: 3.5 g/dL (ref 3.4–4.8)
ALKALINE PHOSPHATASE: 90 U/L (ref 55–145)
ALT (SGPT): 10 U/L (ref <31)
ANION GAP: 10 mmol/L (ref 4–13)
AST (SGOT): 20 U/L (ref 11–34)
BILIRUBIN TOTAL: 0.4 mg/dL (ref 0.3–1.3)
BUN/CREA RATIO: 13 (ref 6–22)
BUN: 13 mg/dL (ref 8–25)
CALCIUM: 9.4 mg/dL (ref 8.6–10.3)
CHLORIDE: 107 mmol/L (ref 96–111)
CO2 TOTAL: 25 mmol/L (ref 23–31)
CREATININE: 1.04 mg/dL (ref 0.60–1.05)
ESTIMATED GFR - FEMALE: 53 mL/min/BSA — ABNORMAL LOW (ref >=60)
GLUCOSE: 117 mg/dL (ref 65–125)
POTASSIUM: 3.9 mmol/L (ref 3.5–5.1)
PROTEIN TOTAL: 7.2 g/dL (ref 6.0–8.0)
SODIUM: 142 mmol/L (ref 136–145)

## 2022-10-22 LAB — ECG 12 LEAD
Atrial Rate: 78 {beats}/min
Calculated P Axis: 75 degrees
Calculated R Axis: -71 degrees
Calculated T Axis: 32 degrees
PR Interval: 190 ms
QRS Duration: 140 ms
QT Interval: 394 ms
QTC Calculation: 449 ms
Ventricular rate: 78 {beats}/min

## 2022-10-22 LAB — PTT (PARTIAL THROMBOPLASTIN TIME): APTT: 31.7 s (ref 23.0–32.0)

## 2022-10-22 LAB — PT/INR
INR: 1.05 (ref 0.90–1.10)
PROTHROMBIN TIME: 11.4 s (ref 9.0–13.0)

## 2022-10-22 LAB — MAGNESIUM: MAGNESIUM: 1.9 mg/dL (ref 1.8–2.6)

## 2022-10-22 MED ORDER — ACETAMINOPHEN 325 MG TABLET
650.0000 mg | ORAL_TABLET | ORAL | Status: DC | PRN
Start: 2022-10-22 — End: 2022-10-24

## 2022-10-22 MED ORDER — PRAVASTATIN 40 MG TABLET
40.0000 mg | ORAL_TABLET | Freq: Every evening | ORAL | Status: DC
Start: 2022-10-22 — End: 2022-10-23
  Administered 2022-10-22: 40 mg via ORAL
  Filled 2022-10-22 (×2): qty 1

## 2022-10-22 MED ORDER — ONDANSETRON HCL (PF) 4 MG/2 ML INJECTION SOLUTION
4.0000 mg | Freq: Four times a day (QID) | INTRAMUSCULAR | Status: DC | PRN
Start: 2022-10-22 — End: 2022-10-24

## 2022-10-22 MED ORDER — SENNOSIDES 8.6 MG TABLET
8.6000 mg | ORAL_TABLET | Freq: Every evening | ORAL | Status: DC
Start: 2022-10-22 — End: 2022-10-24
  Administered 2022-10-22 – 2022-10-23 (×2): 8.6 mg via ORAL
  Filled 2022-10-22 (×3): qty 1

## 2022-10-22 MED ORDER — ASPIRIN 81 MG CHEWABLE TABLET
81.0000 mg | CHEWABLE_TABLET | Freq: Every day | ORAL | Status: DC
Start: 2022-10-23 — End: 2022-10-24
  Administered 2022-10-23 – 2022-10-24 (×2): 81 mg via ORAL
  Filled 2022-10-22 (×2): qty 1

## 2022-10-22 MED ORDER — HEPARIN (PORCINE) 5,000 UNIT/ML INJECTION SOLUTION
5000.0000 [IU] | Freq: Three times a day (TID) | INTRAMUSCULAR | Status: DC
Start: 2022-10-22 — End: 2022-10-24
  Administered 2022-10-22 – 2022-10-24 (×6): 5000 [IU] via SUBCUTANEOUS
  Filled 2022-10-22 (×9): qty 1

## 2022-10-22 MED ORDER — SODIUM CHLORIDE 0.9 % (FLUSH) INJECTION SYRINGE
3.0000 mL | INJECTION | INTRAMUSCULAR | Status: DC | PRN
Start: 2022-10-22 — End: 2022-10-24

## 2022-10-22 MED ORDER — TRAZODONE 50 MG TABLET
50.0000 mg | ORAL_TABLET | Freq: Every evening | ORAL | Status: DC
Start: 2022-10-22 — End: 2022-10-24
  Administered 2022-10-22 – 2022-10-23 (×2): 50 mg via ORAL
  Filled 2022-10-22 (×3): qty 1

## 2022-10-22 MED ORDER — ASPIRIN 81 MG CHEWABLE TABLET
324.0000 mg | CHEWABLE_TABLET | ORAL | Status: AC
Start: 2022-10-22 — End: 2022-10-22
  Administered 2022-10-22: 324 mg via ORAL
  Filled 2022-10-22: qty 4

## 2022-10-22 MED ORDER — LEVOTHYROXINE 100 MCG TABLET
100.0000 ug | ORAL_TABLET | Freq: Every morning | ORAL | Status: DC
Start: 2022-10-22 — End: 2022-10-24
  Administered 2022-10-22 – 2022-10-24 (×3): 100 ug via ORAL
  Filled 2022-10-22 (×4): qty 1

## 2022-10-22 MED ORDER — SODIUM CHLORIDE 0.9 % (FLUSH) INJECTION SYRINGE
3.0000 mL | INJECTION | Freq: Three times a day (TID) | INTRAMUSCULAR | Status: DC
Start: 2022-10-22 — End: 2022-10-24
  Administered 2022-10-22 – 2022-10-24 (×6): 0 mL

## 2022-10-22 MED ORDER — AMLODIPINE 5 MG TABLET
5.0000 mg | ORAL_TABLET | Freq: Every day | ORAL | Status: DC
Start: 2022-10-22 — End: 2022-10-24
  Administered 2022-10-22 – 2022-10-24 (×3): 5 mg via ORAL
  Filled 2022-10-22 (×3): qty 1

## 2022-10-22 NOTE — Speech Evaluation (Signed)
[facility]  BEDSIDE SWALLOWING ASSESSMENT  Patient Name: Bridget [name]  Date of Birth: 1934-11-30  MRN: [id number]  Date of Service: 10/22/2022  Date of Admission: 10/22/2022    HPI: This patient is a 88 year old female who presented to [facility] From home following positive MRI results. MRI was completed as an outpatient 9/25 and revealed small acute infarct right cerebellar hemisphere. Patient states this was completed due to complaint of mild confusion which has been ongoing for about 1 year. She denies any acute cognitive changes and denies history of dysphagia. She has no recent history with [facility]. Patient passed the nursing aspiration screen and was placed on thin liquids, regular solids.     Admitting Diagnosis:     ICD-10-CM    1. Cerebrovascular accident (CVA), unspecified mechanism (CMS HCC)  I63.9       2. CVA (cerebral vascular accident) (CMS HCC)  I63.9 TRANSTHORACIC ECHOCARDIOGRAM - W/SHUNT STUDY - ADULT  -          Past Medical History:   Diagnosis Date    Arthritis     HTN (hypertension)          Social History     Socioeconomic History    Marital status: Married   Tobacco Use    Smoking status: Never    Smokeless tobacco: Never   Vaping Use    Vaping status: Never Used   Substance and Sexual Activity    Alcohol use: Never    Drug use: Never     Med List:    acetaminophen (TYLENOL) tablet, 650 mg, Oral, Q4H PRN  amLODIPine (NORVASC) tablet, 5 mg, Oral, Daily  [START ON 10/23/2022] aspirin chewable tablet 81 mg, 81 mg, Oral, Daily  heparin 5,000 unit/mL injection, 5,000 Units, Subcutaneous, Q8HRS  levothyroxine (SYNTHROID) tablet, 100 mcg, Oral, QAM  NS flush syringe, 3 mL, Intracatheter, Q8HRS  NS flush syringe, 3 mL, Intracatheter, Q1H PRN  ondansetron (ZOFRAN) 2 mg/mL injection, 4 mg, Intravenous, Q6H PRN  pravastatin (PRAVACHOL) tablet, 40 mg, Oral, QPM  sennosides (SENNA) tablet, 8.6 mg, Oral, QPM  traZODone (DESYREL) tablet, 50 mg, Oral, NIGHTLY      Current Diet Order:  DIET CARDIAC (NO  CAFFEINE, 2G NA, LOWFAT, LOW CHOL) Do you want to initiate MNT Protocol? Yes  Level of Consciousness: Awake, Alert, Responsive   Behavior: Cooperative  Respiration: Room Air   Oral Mech Examination: WFL overall      SWALLOW ASSESSMENT:  Oral Phase Characteristics: WFL  Pharyngeal Phase Characteristics: HLE present     PO TRIALS   Consistency Method Volume Response   Trial 1 Thin  Cup  Consecutive  No overt s/s of aspiration noted         IMPRESSIONS: Oral motor exam unremarkable. Articulation is precise, 100% intelligible. No s/s of anomia in conversation. Voice is clear. Patient is oriented x 3. No overt s/s of aspiration with thin liquids. Do not suspect an acute oropharyngeal dysphagia. ST is not warranted in acute care. If patient is concerned for acute changes in cognitive function, recommend ST on rehab basis.     Proceed to Instrumental Assessment? No    The patient has the following risk factors for pneumonia: No current risk factors for pneumonia   The patient has the following risk factors for Dysphagia and/or aspiration: Neurological Disease    SCREENING:  Language: WFL  Cognition: WFL  Motor Speech: WFL    RECOMMENDATIONS:  Dietary Recommendations:  Liquid Recommendation: Thin Liquids  Solids Recommendation: Regular Texture Solids   Medication Recommendations: Whole in yogurt or applesauce or water  Aspiration Precautions: Upright as close to 90 degrees as possible during mealtimes      PLAN OF CARE:  No ST indicated at this time       Lizabeth Leyden, MS, [facility] - SLP  10/22/2022 15:10

## 2022-10-22 NOTE — Care Management Notes (Signed)
[facility]  Care Management Initial Evaluation    Patient Name: Bridget [name] [facility]  Date of Birth: [birth year]  Sex: female  Date/Time of Admission: 10/22/2022 11:36 AM  Room/Bed: ED25/ED25  Payor: AETNA-MEDI-ADV / Plan: AETNA MEDICARE ADVANTAGE (MC) / Product Type: MEDICARE MC /   Primary Care Providers:  Bridget Eon, MD, MD (General)    Pharmacy Info:   Preferred Pharmacy       [facility] [phone] [place] - [address] AT [facility] [address]    [address] [place] [address]    Phone: [phone] Fax: [phone]    Hours: Not open 24 hours          Emergency Contact Info:   Extended Emergency Contact Information  Primary Emergency Contact: Bridget [name],edward  Home Phone: [phone]  Relation: Husband  Preferred language: English  Interpreter needed? No    History:   Bridget [name] is a 88 y.o., female, admitted with cerebellar infarct     Height/Weight: 160 cm (5\' 3" ) / 77.6 kg (171 lb)     LOS: 0 days   Admitting Diagnosis: Cerebellar infarct (CMS [facility]) [I63.9]    Assessment:      10/22/22 1504   Assessment Details   Assessment Type Admission   Date of Care Management Update 10/22/22   Readmission   Is this a readmission? No   Insurance Information/Type   Insurance type Medicare   Medicare Intent to Discharge Documentation   Admit IMM given to: Patient   Admit IMM letter given date 10/22/22   Admit IMM letter time given 1458   Patient Representative Bridget [name]   IMM explained/reviewed with:  Patient Representative;verbalized understanding   Employment/Financial   Patient has Prescription Coverage?  Yes        Name of Insurance Coverage for Medications A[employer] Medicare   Financial Concerns none   Living Environment   Select an age group to open "lives with" row.  Adult   Lives With spouse   Able to Return to Prior Arrangements yes   Home Safety   Home Assessment: No Problems Identified   Home Accessibility no concerns   Custody and Legal Status   Do you have a court appointed  guardian/conservator? No   Are you an emancipated minor? No   Custody Issues? No   Paternity Affidavit Requested? No   Care Management Plan   Discharge Planning Status initial meeting   Discharge plan discussed with: Patient   CM will evaluate for rehabilitation potential yes   Patient choice offered to patient/family yes   Discharge Needs Assessment   Equipment Currently Used at Home none   Discharge Facility/Level of Care Needs Home (Patient/Family Member/other)(code 1)   Transportation Available car;family or friend will provide   Referral Information   Admission Type inpatient   Address Verified verified-no changes   Arrived From home or self-care   ADVANCE DIRECTIVES   Does the Patient have an Advance Directive? Yes, Patient Does Have Advance Directive for Healthcare Treatment   Type of Advance Directive Completed Medical Power of Attorney   Copy of Advance Directives in Chart? No, Copy Requested From Family   Name of MPOA or Healthcare Surrogate Bridget [name]   Phone Number of MPOA or Healthcare Surrogate [phone]   LAY CAREGIVER    Appointed Lay Caregiver? Yes   Lay Caregiver Name Bridget [name]   Lay Caregiver Relationship to patient spouse/significant other   Lay Caregiver Contact Number [phone]  Discharge Plan:  Home (Patient/Family Member/other) (code 1)  SW met with patient and husband to complete initial assessment. Pt ambulates and completes ADLs independently. No DME or HH. Hsb will provide transportation home. Hsb reports that he is MPOA- SW requested copy for chart. Demographics on face sheet confirmed.     The patient will continue to be evaluated for developing discharge needs.     Case Manager: Bridget [name], MSW  Phone: [phone]

## 2022-10-22 NOTE — Pharmacy (Signed)
[facility]  Medication History Changes  10/22/2022    Patient: Bridget [name], Bridget [name]    MRN: [id number]  Date of Birth:  10/03/1934    The following medications were    Changed:  .    Added:  Donepezil, Lisinopril/HCTZ, Risedronate    Removed:  Betamethasone, Zofran, Trazodone    Information was collected from:  Patient and Pharmacy

## 2022-10-22 NOTE — Care Plan (Signed)
Problem: Stroke, Ischemic (Includes Transient Ischemic Attack)  Goal: Optimal Cerebral Tissue Perfusion  Intervention: Protect and Optimize Cerebral Perfusion  Recent Flowsheet Documentation  Taken 10/22/2022 2000 by Odelia Gage, RN  Cerebral Perfusion Promotion: blood pressure monitored     Problem: Stroke, Ischemic (Includes Transient Ischemic Attack)  Goal: Optimal Functional Ability  Intervention: Optimize Functional Ability  Recent Flowsheet Documentation  Taken 10/22/2022 2000 by Odelia Gage, RN  Self-Care Promotion:   independence encouraged   BADL personal objects within reach   BADL personal routines maintained     Problem: Fall Injury Risk  Goal: Absence of Fall and Fall-Related Injury  Intervention: Identify and Manage Contributors  Recent Flowsheet Documentation  Taken 10/22/2022 2000 by Odelia Gage, RN  Self-Care Promotion:   independence encouraged   BADL personal objects within reach   BADL personal routines maintained     Problem: Fall Injury Risk  Goal: Absence of Fall and Fall-Related Injury  Intervention: Promote Injury-Free Environment  Recent Flowsheet Documentation  Taken 10/22/2022 2000 by Odelia Gage, RN  Safety Promotion/Fall Prevention:   activity supervised   fall prevention program maintained   nonskid shoes/slippers when out of bed   safety round/check completed     Problem: Fall Injury Risk  Goal: Absence of Fall and Fall-Related Injury  Intervention: Identify and Manage Contributors  Recent Flowsheet Documentation  Taken 10/22/2022 2000 by Odelia Gage, RN  Self-Care Promotion:   independence encouraged   BADL personal objects within reach   BADL personal routines maintained     Problem: Fall Injury Risk  Goal: Absence of Fall and Fall-Related Injury  Intervention: Promote Injury-Free Environment  Recent Flowsheet Documentation  Taken 10/22/2022 2000 by Odelia Gage, RN  Safety Promotion/Fall Prevention:   activity supervised   fall prevention program maintained   nonskid shoes/slippers when out of  bed   safety round/check completed     Problem: Adult Inpatient Plan of Care  Goal: Patient-Specific Goal (Individualized)  Outcome: Ongoing (see interventions/notes)  Flowsheets (Taken 10/22/2022 2000)  Individualized Care Needs: NIH stroke scales per shift  Anxieties, Fears or Concerns: confused not sure what she is here for  Patient-Specific Goals (Include Timeframe): Pt will have no deficits from stroke  Plan of Care Reviewed With: patient     Problem: Adult Inpatient Plan of Care  Goal: Absence of Hospital-Acquired Illness or Injury  Intervention: Identify and Manage Fall Risk  Recent Flowsheet Documentation  Taken 10/22/2022 2000 by Odelia Gage, RN  Safety Promotion/Fall Prevention:   activity supervised   fall prevention program maintained   nonskid shoes/slippers when out of bed   safety round/check completed     Problem: Adult Inpatient Plan of Care  Goal: Absence of Hospital-Acquired Illness or Injury  Intervention: Prevent and Manage VTE (Venous Thromboembolism) Risk  Recent Flowsheet Documentation  Taken 10/22/2022 2000 by Odelia Gage, RN  VTE Prevention/Management:   ambulation promoted   anticoagulant therapy initiated      Tana Felts, RN

## 2022-10-22 NOTE — ED Notes (Signed)
DR. Truddie Crumble NOTIFIED OF NEURO CONSULT LISTED FOR HIM BY DR. Collier Flowers

## 2022-10-22 NOTE — ED Triage Notes (Signed)
Pt sent for abnormal brain MRI yesterday... pt denies symptoms and said mri was done for a checkup

## 2022-10-22 NOTE — Care Plan (Signed)
Problem: Stroke, Ischemic (Includes Transient Ischemic Attack)  Goal: Optimal Coping  Outcome: Ongoing (see interventions/notes)  Goal: Effective Bowel Elimination  Outcome: Ongoing (see interventions/notes)  Goal: Optimal Cerebral Tissue Perfusion  Outcome: Ongoing (see interventions/notes)  Goal: Optimal Cognitive Function  Outcome: Ongoing (see interventions/notes)  Goal: Improved Communication Skills  Outcome: Ongoing (see interventions/notes)  Goal: Optimal Functional Ability  Outcome: Ongoing (see interventions/notes)  Goal: Effective Oxygenation and Ventilation  Outcome: Ongoing (see interventions/notes)  Goal: Improved Sensorimotor Function  Outcome: Ongoing (see interventions/notes)  Goal: Optimal Eating and Swallowing without Aspiration  Outcome: Ongoing (see interventions/notes)  Goal: Effective Urinary Elimination  Outcome: Ongoing (see interventions/notes)     Problem: Health Knowledge, Opportunity to Enhance (Adult,Obstetrics,Pediatric)  Goal: Knowledgeable about Health Subject/Topic  Description: Patient will demonstrate the desired outcomes by discharge/transition of care.  Outcome: Ongoing (see interventions/notes)     Problem: Skin Injury Risk Increased  Goal: Skin Health and Integrity  Outcome: Ongoing (see interventions/notes)     Problem: Fall Injury Risk  Goal: Absence of Fall and Fall-Related Injury  Outcome: Ongoing (see interventions/notes)     Problem: Adult Inpatient Plan of Care  Goal: Plan of Care Review  Outcome: Ongoing (see interventions/notes)  Goal: Patient-Specific Goal (Individualized)  Outcome: Ongoing (see interventions/notes)  Flowsheets (Taken 10/22/2022 1600)  Individualized Care Needs: monitor NIH  Anxieties, Fears or Concerns: none verbalized  Patient-Specific Goals (Include Timeframe): D/C when medically clear  Goal: Absence of Hospital-Acquired Illness or Injury  Outcome: Ongoing (see interventions/notes)  Goal: Optimal Comfort and Wellbeing  Outcome: Ongoing (see  interventions/notes)  Goal: Rounds/Family Conference  Outcome: Ongoing (see interventions/notes)   Joya Friar, LPN

## 2022-10-22 NOTE — ED Nurses Note (Signed)
Report given to [facility], nurse on CDU. Pt and husband updated on POC and ne bed status. VSS. No questions at this time. Awaiting Tele then transport.

## 2022-10-22 NOTE — ED Nurses Note (Signed)
Pharmacy called for pt's due medications to be sent down.

## 2022-10-22 NOTE — ED Provider Notes (Signed)
[facility]  ED Primary Provider Note  History of Present Illness      Chief Complaint   Patient presents with    Abnormal Radiology Test       [facility], date of birth [birth year], is a 88 y.o. female who arrived by Car.    HPI     HPI:  Patient presents for evaluation of abnormal test results.  Reportedly she was called and told her test was abnormal and she needed to come to the emergency room.  Patient states that she had her annual checkup with her primary care physician and they ordered lab work and they ordered an MRI for a "checkup".  I discussed with her that I did not feel that an MRI would just be done for a routine checkup and there had to be some reason.  Initially she could not tell me any reason that would be concerning then she started to explained to me that over the past year she has been having difficulty with memory.  I suspect this is why the MRI was ordered.  MRI showed a subacute stroke.  Patient has no complaints.  She has no weakness in her arms legs.  She has no numbness in her arms or legs.  No facial droop slurring of the words.  She feels well and has no other complaints at this time.  She states that she did not really want to be here but she was listen to her provider and came to the emergency room for further evaluation.      Review of Systems   Constitutional:  Negative for chills and fever.   HENT:  Negative for ear pain and sore throat.    Eyes:  Negative for pain and visual disturbance.   Respiratory:  Negative for cough and shortness of breath.    Cardiovascular:  Negative for chest pain and palpitations.   Gastrointestinal:  Negative for abdominal pain and vomiting.   Genitourinary:  Negative for dysuria and hematuria.   Musculoskeletal:  Negative for arthralgias and back pain.   Skin:  Negative for color change and rash.   Neurological:  Negative for seizures and syncope.   All other systems reviewed and are negative.         Historical Data     History Reviewed This Encounter: Medical History  Surgical History  Family History  Social History       Physical Exam    ED Triage Vitals   BP (Non-Invasive) 10/22/22 1131 (!) 154/67   Heart Rate 10/22/22 1131 86   Respiratory Rate 10/22/22 1131 18   Temperature 10/22/22 1131 37 C (98.6 F)   SpO2 10/22/22 1131 94 %   Weight 10/22/22 1129 77.6 kg (171 lb)   Height 10/22/22 1129 1.6 m (5\' 3" )       Physical Exam  Vitals and nursing note reviewed.   Constitutional:       General: She is not in acute distress.     Appearance: She is well-developed.   HENT:      Head: Normocephalic and atraumatic.   Eyes:      Conjunctiva/sclera: Conjunctivae normal.   Cardiovascular:      Rate and Rhythm: Normal rate and regular rhythm.      Heart sounds: No murmur heard.  Pulmonary:      Effort: Pulmonary effort is normal. No respiratory distress.      Breath sounds: Normal breath sounds.   Abdominal:  Palpations: Abdomen is soft.      Tenderness: There is no abdominal tenderness.   Musculoskeletal:         General: No swelling.      Cervical back: Neck supple.   Skin:     General: Skin is warm and dry.      Capillary Refill: Capillary refill takes less than 2 seconds.   Neurological:      Mental Status: She is alert.      Comments: At the time of my exam, cranial nerves 2-12 are grossly intact.  Extraocular muscles are intact.  Finger-nose is intact.  Equal grip strength bilaterally.  No pronator drift.  5/5 muscle strength throughout all 4 extremities.  No slurring of the speech.  No facial droop appreciated.   Psychiatric:         Mood and Affect: Mood normal.            DDx:  Differential diagnosis includes, but is not limited to CVA, abnormal radiology test      Orders Placed This Encounter    XR AP MOBILE CHEST    CBC/DIFF    COMPREHENSIVE METABOLIC PANEL, NON-FASTING    MAGNESIUM    PT/INR    PTT (PARTIAL THROMBOPLASTIN TIME)    TROPONIN-I    CBC WITH DIFF    PULSE OXIMETRY - CONTINUOUS    ECG 12 LEAD    INSERT &  MAINTAIN PERIPHERAL IV ACCESS    aspirin chewable tablet 324 mg         Patient Data    Labs reviewed by myself  Results for orders placed or performed during the hospital encounter of 10/22/22 (from the past 12 hour(s))   COMPREHENSIVE METABOLIC PANEL, NON-FASTING   Result Value Ref Range    SODIUM 142 136 - 145 mmol/L    POTASSIUM 3.9 3.5 - 5.1 mmol/L    CHLORIDE 107 96 - 111 mmol/L    CO2 TOTAL 25 23 - 31 mmol/L    ANION GAP 10 4 - 13 mmol/L    BUN 13 8 - 25 mg/dL    CREATININE 8.29 5.62 - 1.05 mg/dL    BUN/CREA RATIO 13 6 - 22    ALBUMIN 3.5 3.4 - 4.8 g/dL     CALCIUM 9.4 8.6 - 13.0 mg/dL    GLUCOSE 865 65 - 784 mg/dL    ALKALINE PHOSPHATASE 90 55 - 145 U/L    ALT (SGPT) 10 <31 U/L    AST (SGOT)  20 11 - 34 U/L    BILIRUBIN TOTAL 0.4 0.3 - 1.3 mg/dL    PROTEIN TOTAL 7.2 6.0 - 8.0 g/dL    ESTIMATED GFR - FEMALE 53 (L) >=60 mL/min/BSA   MAGNESIUM   Result Value Ref Range    MAGNESIUM 1.9 1.8 - 2.6 mg/dL   PT/INR   Result Value Ref Range    PROTHROMBIN TIME 11.4 9.0 - 13.0 seconds    INR 1.05 0.90 - 1.10   PTT (PARTIAL THROMBOPLASTIN TIME)   Result Value Ref Range    APTT 31.7 23.0 - 32.0 seconds   TROPONIN-I   Result Value Ref Range    TROPONIN-I HS 5.6 <=14.0 ng/L ng/L   CBC WITH DIFF   Result Value Ref Range    WBC 7.9 3.7 - 11.0 x10^3/uL    RBC 5.04 3.85 - 5.22 x10^6/uL    HGB 14.3 11.5 - 16.0 g/dL    HCT 69.6 29.5 - 28.4 %  MCV 86.5 78.0 - 100.0 fL    MCH 28.4 26.0 - 32.0 pg    MCHC 32.8 31.0 - 35.5 g/dL    RDW-CV 47.8 29.5 - 62.1 %    PLATELETS 245 150 - 400 x10^3/uL    MPV 9.2 8.7 - 12.5 fL    NEUTROPHIL % 64.0 %    LYMPHOCYTE % 24.0 %    MONOCYTE % 8.0 %    EOSINOPHIL % 3.0 %    BASOPHIL % 1.0 %    NEUTROPHIL # 5.06 1.50 - 7.70 x10^3/uL    LYMPHOCYTE # 1.89 1.00 - 4.80 x10^3/uL    MONOCYTE # 0.60 0.20 - 1.10 x10^3/uL    EOSINOPHIL # 0.24 <=0.50 x10^3/uL    BASOPHIL # <0.10 <=0.20 x10^3/uL    IMMATURE GRANULOCYTE % 0.0 0.0 - 1.0 %    IMMATURE GRANULOCYTE # <0.10 <0.10 x10^3/uL        No orders to display            EKG:  Most Recent EKG This Encounter   ECG 12 LEAD    Collection Time: 10/22/22 11:51 AM   Result Value    Ventricular rate 78    Atrial Rate 78    PR Interval 190    QRS Duration 140    QT Interval 394    QTC Calculation 449    Calculated P Axis 75    Calculated R Axis -71    Calculated T Axis 32    Narrative    Normal sinus rhythm  Left axis deviation  Right bundle branch block  Abnormal ECG  No previous ECGs available  Confirmed by Darliss Ridgel [phone]) on 10/22/2022 11:53:16 AM         ED Course:  Relevant prior external notes and tests were reviewed by myself if available.  During the patient's stay in the emergency department, the above listed imaging and/or labs were performed to assist with medical decision making and were reviewed by myself when available for review.   Patient rechecked throughout remainder of emergency department course.   All questions/concerns addressed to the best of our ability.        Medical Decision Making         Medical Decision Making  We did order lab work to include CBC, CMP, cardiac markers.  We ordered EKG and chest x-ray.  Will keep patient on a cardiac monitor.    Problems Addressed:  Cerebrovascular accident (CVA), unspecified mechanism (CMS HCC): acute illness or injury    Amount and/or Complexity of Data Reviewed  Labs: ordered. Decision-making details documented in ED Course.  Radiology: ordered. Decision-making details documented in ED Course.  ECG/medicine tests: ordered and independent interpretation performed. Decision-making details documented in ED Course.  Discussion of management or test interpretation with external provider(s): Case discussed with Dr. Truddie Crumble and the hospitalist team who accepted the patient for admission.    Risk  OTC drugs.  Decision regarding hospitalization.             ED Course as of 10/22/22 1238   Thu Oct 22, 2022   1148 Upon review of the MRI from it was ordered for amnesia.  The small acute infarct in the right  cerebellar hemisphere.   1202 Case discussed with Neurology Dr. Truddie Crumble.  I did also order a dose of aspirin.  It was uncertain how acute the stroke is.   1237 Lab work reviewed.  Case discussed with Dr. Truddie Crumble.  Also  contacted the hospitalist team for admission.       Clinical Impression:  Clinical Impression   Cerebrovascular accident (CVA), unspecified mechanism (CMS HCC) (Primary)          Disposition:  Admitted       No follow-up provider specified.    Current Discharge Medication List              // Maryclare Bean, DO  10/22/2022 , 11:45 .   [facility], [facility] Emergency Department     54}

## 2022-10-22 NOTE — H&P (Signed)
[facility]  Internal Medicine  Admission H&P    Date of Service:  10/22/2022  Bridget, [name], 88 y.o. female  Date of Admission:  10/22/2022  Date of Birth:  04-Jan-1935  PCP: Selinda Eon, MD    Chief Complaint:  "I guess my memory is bad"    History of Present Illness:   Bridget [name] is an 88 yr old female with PMH of hypertension, hypothyroidism, and hyperlipidemia who presents with abnormal imaging.  Patient said that roughly 1 week ago she saw her PCP for a yearly exam.  She told her PCP that she has noticed some memory issues PCP recommended an MRI which was completed yesterday.  Patient states that her PCP called her today and told her she needed to report to the emergency department as her MRI showed a stroke.  She denies any recent speech abnormalities, visual disturbances, extremity weakness or numbness, and facial weakness.  She denies falls, trauma, dizziness, or lightheadedness.  She denies chest pain, palpitations, shortness for breath, nausea, vomiting, abdominal pain, fevers, and chills.    On arrival to ED patient was afebrile, blood pressure 154/67, heart rate 89, respiratory rate 20, satting 94% on room air.  Labs revealed white count of 8, hemoglobin 14, hematocrit 43, platelets 245, sodium 142, potassium 4, BUN 13, creatinine 1, and glucose 117.  LFTs were unremarkable.  Troponin was negative.  Chest x-ray was unremarkable.  MRI brain with and without contrast demonstrated a small acute infarct in the right cerebellar hemisphere.  Patient was given aspirin in the emergency department.  Admitted for further workup.      History:    PMH: as above  PSH:  Denies  Social   ETOH:  Denies   Nicotine:  Denies   Illicit:  Denies   Living situation:  Lives at home with husband      REVIEW OF SYSTEMS:  CONSTITUTIONAL: Patient denies falls, lightheadedness, dizziness, fever or chills.  EYES: Patient denies any loss of vision, diplopia or blurred vision  CARDIOVASCULAR: Patient denies chest  pain, palpitations and lower extremity edema   RESPIRATORY: Patient denies any shortness of breath, cough, or hemoptysis   NEUROLOGICAL: Patient denies facial weakness, extremity weakness, extremity  GI: Patient denies any abdominal pain, nausea, vomiting, constipation and diarrhea   GU: Patient denies dysuria and frequency   SKIN: Patient denies any rashes or lesions.   MUSCULOSKELETAL: Patient denies any joint pain or injury       Examination:  Temperature: 37 C (98.6 F) Heart Rate: 89 BP (Non-Invasive): (!) 154/67   Respiratory Rate: 20 SpO2: 94 %       Exam:  Nursing note and vitals reviewed.   General:  Caucasian female who appears younger than stated age in no acute distress   HEENT: Pharynx without erythema, exudate. Conjunctivae are without injection or scleral icterus.  Head is normocephalic and atraumatic   Neck: Neck supple.   Cardio: Regular rate and rhythm. No murmurs, gallops, or rubs. No pedal edema   Resp: Clear to auscultation bilaterally. No wheezes, rales, rhonchi or crackles. Normal resp effort.  Symmetric expansion   Abd: Bowel sounds present in all 4 quadrants.  No rebound tenderness or involuntary guarding.  GU: Deferred exam  Extremities: No edema or swelling noted.   Skin: No rashes, ulcers, or bruising.  Neuro:  Patient was oriented to self and place but not time or situation.  Face is symmetric, PERRL, extraocular movements intact, facial sensation intact, speech  clear and fluent, strength 5/5 throughout    I have reviewed recent imaging and laboratory work      Assessment/Plan:   Bridget [name] is an 88 yr old female with PMH of hypertension, hypothyroidism, and hyperlipidemia who presents with abnormal imaging.      # right cerebellar acute infarct  - admit to floor  - monitor vitals, telemetry, pulse ox, I+O's, and daily weights   - neurology consulted, appreciate recommendations   - we will follow up the results in terms of whether a CTA head and neck is indicated  - continue  aspirin and statin  - echo with shunt  - monitor on tele  - consult PT, OT, SLP    # hypertension  - patient was out of window for benefit from permissive hypertension therefore we will continue her home Norvasc    # hypothyroidism  - continue home Synthroid    DVT/GI Prophylaxis:  Heparin    CODE Status:  Full    Disposition Planning:  Pending        Sharon Mt 10/22/2022   Department of Hospital Medicine

## 2022-10-23 ENCOUNTER — Inpatient Hospital Stay (HOSPITAL_COMMUNITY): Payer: Medicare (Managed Care) | Admitting: Ultrasound

## 2022-10-23 ENCOUNTER — Inpatient Hospital Stay (HOSPITAL_COMMUNITY): Payer: Medicare (Managed Care)

## 2022-10-23 DIAGNOSIS — E039 Hypothyroidism, unspecified: Secondary | ICD-10-CM

## 2022-10-23 DIAGNOSIS — I371 Nonrheumatic pulmonary valve insufficiency: Secondary | ICD-10-CM

## 2022-10-23 DIAGNOSIS — I10 Essential (primary) hypertension: Secondary | ICD-10-CM

## 2022-10-23 DIAGNOSIS — E041 Nontoxic single thyroid nodule: Secondary | ICD-10-CM

## 2022-10-23 DIAGNOSIS — E785 Hyperlipidemia, unspecified: Secondary | ICD-10-CM

## 2022-10-23 DIAGNOSIS — R413 Other amnesia: Secondary | ICD-10-CM

## 2022-10-23 DIAGNOSIS — I6521 Occlusion and stenosis of right carotid artery: Secondary | ICD-10-CM

## 2022-10-23 DIAGNOSIS — R4189 Other symptoms and signs involving cognitive functions and awareness: Secondary | ICD-10-CM

## 2022-10-23 DIAGNOSIS — I639 Cerebral infarction, unspecified: Secondary | ICD-10-CM

## 2022-10-23 DIAGNOSIS — Z8616 Personal history of COVID-19: Secondary | ICD-10-CM

## 2022-10-23 DIAGNOSIS — I081 Rheumatic disorders of both mitral and tricuspid valves: Secondary | ICD-10-CM

## 2022-10-23 DIAGNOSIS — I422 Other hypertrophic cardiomyopathy: Secondary | ICD-10-CM

## 2022-10-23 DIAGNOSIS — I517 Cardiomegaly: Secondary | ICD-10-CM

## 2022-10-23 LAB — CBC WITH DIFF
BASOPHIL #: <0.1 10*3/uL (ref <=0.20)
BASOPHIL %: 1 %
EOSINOPHIL #: 0.24 10*3/uL (ref <=0.50)
EOSINOPHIL %: 4 %
HCT: 41.8 % (ref 34.8–46.0)
HGB: 13.6 g/dL (ref 11.5–16.0)
IMMATURE GRANULOCYTE #: <0.1 10*3/uL (ref <0.10)
IMMATURE GRANULOCYTE %: 0 % (ref 0.0–1.0)
LYMPHOCYTE #: 1.7 10*3/uL (ref 1.00–4.80)
LYMPHOCYTE %: 25 %
MCH: 28.5 pg (ref 26.0–32.0)
MCHC: 32.5 g/dL (ref 31.0–35.5)
MCV: 87.4 fL (ref 78.0–100.0)
MONOCYTE #: 0.63 10*3/uL (ref 0.20–1.10)
MONOCYTE %: 9 %
MPV: 9.5 fL (ref 8.7–12.5)
NEUTROPHIL #: 4.03 10*3/uL (ref 1.50–7.70)
NEUTROPHIL %: 61 %
PLATELETS: 224 10*3/uL (ref 150–400)
RBC: 4.78 10*6/uL (ref 3.85–5.22)
RDW-CV: 13.4 % (ref 11.5–15.5)
WBC: 6.7 10*3/uL (ref 3.7–11.0)

## 2022-10-23 LAB — LIPID PANEL
CHOL/HDL RATIO: 4
CHOLESTEROL: 149 mg/dL (ref 100–200)
HDL CHOL: 37 mg/dL — ABNORMAL LOW (ref >=50)
LDL CALC: 92 mg/dL (ref <100)
NON-HDL: 112 mg/dL (ref <=190)
TRIGLYCERIDES: 108 mg/dL (ref <150)
VLDL CALC: 17 mg/dL (ref <30)

## 2022-10-23 LAB — BASIC METABOLIC PANEL, FASTING
ANION GAP: 10 mmol/L (ref 4–13)
BUN/CREA RATIO: 12 (ref 6–22)
BUN: 13 mg/dL (ref 8–25)
CALCIUM: 9.5 mg/dL (ref 8.6–10.3)
CHLORIDE: 106 mmol/L (ref 96–111)
CO2 TOTAL: 28 mmol/L (ref 23–31)
CREATININE: 1.05 mg/dL (ref 0.60–1.05)
ESTIMATED GFR - FEMALE: 52 mL/min/BSA — ABNORMAL LOW (ref >=60)
GLUCOSE: 95 mg/dL (ref 70–99)
POTASSIUM: 4 mmol/L (ref 3.5–5.1)
SODIUM: 144 mmol/L (ref 136–145)

## 2022-10-23 LAB — HGA1C (HEMOGLOBIN A1C WITH EST AVG GLUCOSE)
ESTIMATED AVERAGE GLUCOSE: 117 mg/dL
HEMOGLOBIN A1C: 5.7 % (ref <=5.7)

## 2022-10-23 LAB — MAGNESIUM: MAGNESIUM: 2 mg/dL (ref 1.8–2.6)

## 2022-10-23 MED ORDER — ROSUVASTATIN 20 MG TABLET
20.0000 mg | ORAL_TABLET | Freq: Every evening | ORAL | Status: DC
Start: 2022-10-23 — End: 2022-10-24
  Administered 2022-10-23: 20 mg via ORAL
  Filled 2022-10-23 (×2): qty 1

## 2022-10-23 MED ORDER — IOPAMIDOL 370 MG IODINE/ML (76 %) INTRAVENOUS SOLUTION
100.0000 mL | INTRAVENOUS | Status: AC
Start: 2022-10-23 — End: 2022-10-23
  Administered 2022-10-23: 100 mL via INTRAVENOUS

## 2022-10-23 MED ORDER — CLOPIDOGREL 75 MG TABLET
75.0000 mg | ORAL_TABLET | Freq: Every day | ORAL | Status: DC
Start: 2022-10-23 — End: 2022-10-24
  Administered 2022-10-23 – 2022-10-24 (×2): 75 mg via ORAL
  Filled 2022-10-23 (×2): qty 1

## 2022-10-23 NOTE — Care Plan (Signed)
Problem: Stroke, Ischemic (Includes Transient Ischemic Attack)  Goal: Optimal Coping  Outcome: Ongoing (see interventions/notes)  Goal: Effective Bowel Elimination  Outcome: Ongoing (see interventions/notes)  Goal: Optimal Cerebral Tissue Perfusion  Outcome: Ongoing (see interventions/notes)  Goal: Optimal Cognitive Function  Outcome: Ongoing (see interventions/notes)  Goal: Improved Communication Skills  Outcome: Ongoing (see interventions/notes)  Goal: Optimal Functional Ability  Outcome: Ongoing (see interventions/notes)  Intervention: Optimize Functional Ability  Recent Flowsheet Documentation  Taken 10/23/2022 0800 by Fermin Schwab, LPN  Activity Management:   ambulated in room   ambulated to bathroom  Goal: Effective Oxygenation and Ventilation  Outcome: Ongoing (see interventions/notes)  Goal: Improved Sensorimotor Function  Outcome: Ongoing (see interventions/notes)  Intervention: Optimize Sensory and Perceptual Ability  Recent Flowsheet Documentation  Taken 10/23/2022 0800 by Marvis Moeller B, LPN  Pressure Reduction Techniques: Frequent weight shifting encouraged  Pressure Reduction Devices: Repositioning wedges/pillows utilized  Goal: Optimal Eating and Swallowing without Aspiration  Outcome: Ongoing (see interventions/notes)  Goal: Effective Urinary Elimination  Outcome: Ongoing (see interventions/notes)     Problem: Health Knowledge, Opportunity to Enhance (Adult,Obstetrics,Pediatric)  Goal: Knowledgeable about Health Subject/Topic  Description: Patient will demonstrate the desired outcomes by discharge/transition of care.  Outcome: Ongoing (see interventions/notes)     Problem: Skin Injury Risk Increased  Goal: Skin Health and Integrity  Outcome: Ongoing (see interventions/notes)  Intervention: Optimize Skin Protection  Recent Flowsheet Documentation  Taken 10/23/2022 0800 by Marvis Moeller B, LPN  Pressure Reduction Techniques: Frequent weight shifting encouraged  Pressure Reduction Devices: Repositioning wedges/pillows  utilized  Skin Protection: adhesive use limited  Activity Management:   ambulated in room   ambulated to bathroom     Problem: Fall Injury Risk  Goal: Absence of Fall and Fall-Related Injury  Outcome: Ongoing (see interventions/notes)  Intervention: Promote Scientist, clinical (histocompatibility and immunogenetics) Documentation  Taken 10/23/2022 0800 by Fermin Schwab, LPN  Safety Promotion/Fall Prevention:   activity supervised   fall prevention program maintained   motion sensor pad activated   nonskid shoes/slippers when out of bed   safety round/check completed     Problem: Fall Injury Risk  Goal: Absence of Fall and Fall-Related Injury  Outcome: Ongoing (see interventions/notes)  Intervention: Promote Scientist, clinical (histocompatibility and immunogenetics) Documentation  Taken 10/23/2022 0800 by Fermin Schwab, LPN  Safety Promotion/Fall Prevention:   activity supervised   fall prevention program maintained   motion sensor pad activated   nonskid shoes/slippers when out of bed   safety round/check completed     Problem: Adult Inpatient Plan of Care  Goal: Plan of Care Review  Outcome: Ongoing (see interventions/notes)  Goal: Patient-Specific Goal (Individualized)  Outcome: Ongoing (see interventions/notes)  Goal: Absence of Hospital-Acquired Illness or Injury  Outcome: Ongoing (see interventions/notes)  Intervention: Identify and Manage Fall Risk  Recent Flowsheet Documentation  Taken 10/23/2022 0800 by Fermin Schwab, LPN  Safety Promotion/Fall Prevention:   activity supervised   fall prevention program maintained   motion sensor pad activated   nonskid shoes/slippers when out of bed   safety round/check completed  Intervention: Prevent Skin Injury  Recent Flowsheet Documentation  Taken 10/23/2022 0800 by Fermin Schwab, LPN  Skin Protection: adhesive use limited  Intervention: Prevent and Manage VTE (Venous Thromboembolism) Risk  Recent Flowsheet Documentation  Taken 10/23/2022 0800 by Fermin Schwab, LPN  VTE Prevention/Management:   ambulation promoted   anticoagulant therapy  maintained  Goal: Optimal Comfort and Wellbeing  Outcome: Ongoing (see interventions/notes)  Goal: Rounds/Family Conference  Outcome: Ongoing (see interventions/notes)   Billiejo Friar,  LPN

## 2022-10-23 NOTE — PT Evaluation (Signed)
[facility]  Physical Therapy Evaluation    Patient: Bridget [name]  MR Number: [id number]  Date of Birth: 10/27/1934  Admitting Diagnosis: Cerebellar infarct (CMS HCC) [I63.9]    REHAB SESSION    Total PT Minutes: 45   Patient Effort: Excellent    Symptoms Noted During/After Session: none    Pretreatment status: Pt seated in bedside chair/wheelchair    Post Treatment Position: Seated in bedside chair/wheelchair, Call light within reach, Telephone within reach, and Patient safety alarm activated      GENERAL INFORMATION    History of Present Illness: Pt is a 88 y.o. female who presented to [facility] on 10/22/2022 with an abnormal brain MRI. Pt found to have a cerebellar infarct.    Pertinent PMH: HTN, Osteoarthritis    Medical Lines: Telemetry                    Respiratory Status: Room air    Precautions/Restrictions: Full Code, Fall risk, Aspiration precautions     LIVING ENVIRONMENT     Living Situation: Spouse    Home Set-up: One story    Steps:      Exterior: 4 step(s) with  B handrails     FUNCTIONAL LEVEL PRIOR    Home Equipment: none      Equipment Brought to Hospital: none    Ambulation: Independent with no assistive device     Transferring: Independent     ADLs: Independent     IADLs: Independent      COGNITIVE ASSESSMENT    Orientation Status: Alert and Oriented    Behavior/Mood Observations: Behavior appropriate to situation, WNL/WFL    Attention: WNL/WFL    Follows Commands: Follows directions     REHAB ASSESSMENT     Skin integrity: Unremarkable    Vital signs: stable    Numeric pain scale: 0 /10         AROM: Strength:   RUE WFL WFL   LUE WFL WFL   RLE WFL WFL   LLE WFL WFL        Bed Mobility:      Assistive devices:  NA      Supine - Sit:  Not tested      Sit - Supine: Not tested    Transfers:      Assistive devices used with transfers: no assistive device                  Sit - stand:  Independent       Bed - Chair/ Chair - bed:  Not tested    Gait:        Ambulation distance:  100 feet        Level of assistance: Independent       Assistive device: no assistive device     Balance:      Balance while sitting         STATIC: Good            DYNAMIC: Good       Balance while standing         STATIC: Good           DYNAMIC: Good    CLINICAL IMPRESSION     Assessment: Patient was pleasant and cooperative during PT eval.  Patient was independent for transfers and ambulation.  Patient was able to ambulate 179ft x1 with no device independently.  Patient does not present with any skilled PT needs. DC PT at  this time.  Recommend discharge to Home.         Initial Modified Rankin Scale: Initial inpatient evaluation = 0     Functional impairments/problem list: none     Rehab potential: Good          Anticipated Equipment Needs at Discharge:  None Anticipated     Anticipated Discharge Disposition: Home    DISCHARGE SUMMARY  Patient does not present with any skilled PT needs. DC PT at this time.  Recommend discharge to Home.       Corlis Leak, PT

## 2022-10-23 NOTE — OT Evaluation (Signed)
[facility]  Occupational Therapy Evaluation     Patient: Bridget [name] [facility]   MR Number: [id number]   Date of Birth: 1935-01-07   Admitting Diagnosis: Cerebellar infarct (CMS HCC) [I63.9]    REHAB SESSION    Total OT Minutes: 45    Patient Effort: Excellent    Symptoms Noted During/After Session: none    Pretreatment status: Pt seated in bedside chair/wheelchair    Post Treatment Position: Seated in bedside chair/wheelchair, Call light within reach, Telephone within reach, and Patient safety alarm activated     GENERAL INFORMATION    History of Present Illness: Pt is a 88 y.o. female who presented to [facility] on 10/22/2022 with an abnormal brain MRI. Pt found to have a cerebellar infarct.    Pertinent PMH: HTN, Osteoarthritis    Medical Lines: Telemetry      Respiratory Status: Room air    Precautions/Restrictions: Full Code, Fall risk, Aspiration precautions    LIVING ENVIRONMENT     Living Situation: Spouse    Home Set-up: One story    Steps:      Exterior: 4 step(s) with  B handrails    FUNCTIONAL LEVEL PRIOR    Home Equipment: none      Equipment Brought to Hospital: none    Ambulation: Independent with no assistive device     Transferring: Independent     ADLs: Independent     IADLs: Independent     COGNITIVE ASSESSMENT    Orientation Status: Alert and Oriented    Behavior/Mood Observations: Behavior appropriate to situation, WNL/WFL    Attention: WNL/WFL    Follows Commands: Follows directions    REHAB ASSESSMENT     Skin integrity: Unremarkable    Vital signs: stable    Numeric pain scale: 0 /10        AROM: Strength:   RUE WFL WFL   LUE WFL WFL   RLE WFL WFL   LLE WFL WFL       Bed Mobility:      Assistive devices:  NA      Supine - Sit:  Not tested      Sit - Supine: Not tested    Transfers:      Assistive devices used with transfers: no assistive device        Sit - stand:  Independent       Bed - Chair/ Chair - bed:  Not tested    ADLs:      Grooming: Not assessed      Bathing: Not assessed       Dressing:         UB: Not assessed         LB: Independent      Toileting: Not assessed    Balance:      Balance while sitting          STATIC:  Good             DYNAMIC: Good       Balance while standing          STATIC: Good            DYNAMIC: Good    CLINICAL IMPRESSION    Assessment: Pt appears to be at her PLOF and has no further skilled OT needs.     Functional impairments/problem list: none    Anticipated Equipment Needs at Discharge:  None Anticipated    Anticipated Discharge Disposition: Home    DISCHARGE SUMMARY  Pt appears to be at her PLOF and has no further skilled OT needs. D/C OT services.    Ross Ludwig, OT

## 2022-10-23 NOTE — Consults (Signed)
[facility]  Neurology Consult      Bridget [name], Bridget [name], 88 y.o. female  Date of Admission:  10/22/2022  Date of Birth:  [birth year]    PCP: Selinda Eon, MD    Information Obtained from: patient and history reviewed via medical record    GEX:[id number] Bridget [name] is a 88 y.o., White female being seen in neurologic consultation for a chief complaint of stroke.  History as taken from admitting provider relates "88 yr old female with PMH of hypertension, hypothyroidism, and hyperlipidemia who presents with abnormal imaging.  Patient said that roughly 1 week ago she saw her PCP for a yearly exam.  She told her PCP that she has noticed some memory issues PCP recommended an MRI which was completed yesterday.  Patient states that her PCP called her today and told her she needed to report to the emergency department as her MRI showed a stroke.  She denies any recent speech abnormalities, visual disturbances, extremity weakness or numbness, and facial weakness.  She denies falls, trauma, dizziness, or lightheadedness.  She denies chest pain, palpitations, shortness for breath, nausea, vomiting, abdominal pain, fevers, and chills".  Currently, the patient states that she feels "fine".  Denies any significant headache, dizziness or confusion.  Further denies and new areas of focal numbness, weakness or poor coordination.  No changes in vision, speech or hearing.  Patient was just recently evaluated in the ED for complaints of nausea with positive home Covid test.  It is worth noting once again that the aforementioned MRI of the brain was ordered on an outpatient basis and associated with an authorization form that dates back to this past July, with an indication of "amnesia".      ROS: Other than ROS in the HPI, all other systems were negative.    Past Medical History:   Diagnosis Date    Arthritis     HTN (hypertension)      Past Surgical History:   Procedure Laterality Date    KNEE SURGERY       Medications Prior to  Admission       Prescriptions    amLODIPine (NORVASC) 5 mg Oral Tablet    TAKE 1 TABLET BY MOUTH EVERY DAY. STOP TAKING THE AMLODIPINE 2.5MG     donepeziL (ARICEPT) 5 mg Oral Tablet    Take 1 Tablet (5 mg total) by mouth Every night    levothyroxine (SYNTHROID) 100 mcg Oral Tablet    TAKE 1 TABLET BY MOUTH FIRST THING IN THE MORNING ONE HOUR BEFORE BREAKFAST ON AN EMPTY STOMACH    lisinopriL-hydrochlorothiazide (ZESTORETIC) 20-12.5 mg Oral Tablet    Take 1 Tablet by mouth Once a day    pravastatin (PRAVACHOL) 20 mg Oral Tablet    Take 1 Tablet (20 mg total) by mouth Every evening    risedronate (ACTONEL) 150 mg Oral Tablet    Take 1 Tablet (150 mg total) by mouth Per Instructions PLEASE SEE ATTACHED FOR DETAILED DIRECTIONS          No Known Allergies    Social History     Tobacco Use    Smoking status: Never    Smokeless tobacco: Never   Substance Use Topics    Alcohol use: Never     Past Family History:   Family Medical History:       Problem Relation (Age of Onset)    No Known Problems Mother, Father, Sister, Brother, Maternal Grandmother, Maternal Grandfather, Paternal Grandmother, Paternal Grandfather, Daughter, Son, Maternal  Aunt, Maternal Uncle, Paternal Aunt, Paternal Uncle, Other              Physical Exam:  Temperature: 36.9 C (98.4 F)  Heart Rate: 64  BP (Non-Invasive): (!) 130/45  Respiratory Rate: 17  SpO2: 94 %    General: patient is found to be in no acute distress  Orientation: awake, alert and oriented to person, place only; unable to state the year  Memory: poor recall  Knowledge: basic fund of knowledge  Attention: normal   Language: able to follow simple commands; difficulty with more complex tasks  Speech: clear and fluent  Ophthalmoscopic: unable to be performed due to poor visualization  Cardiovascular: S1, S2 audible  Cranial nerves: PERRL, EOMI; blink to threat grossly intact; bilateral corneal and gag reflexes intact; face roughly symmetric; tongue grossly midline; hearing within normal  limits; shoulder shrug grossly intact  Motor strength: 5/5  Sensation: intact localization to touch  Tone: unremarkable  Coordination: generally intact  Reflexes: +2 with downgoing plantar responses bilaterally  Gait: not assessed due to patient instability    Current Facility-Administered Medications   Medication Dose Route Frequency    acetaminophen (TYLENOL) tablet  650 mg Oral Q4H PRN    amLODIPine (NORVASC) tablet  5 mg Oral Daily    aspirin chewable tablet 81 mg  81 mg Oral Daily    clopidogrel (PLAVIX) 75 mg tablet  75 mg Oral Daily    heparin 5,000 unit/mL injection  5,000 Units Subcutaneous Q8HRS    levothyroxine (SYNTHROID) tablet  100 mcg Oral QAM    NS flush syringe  3 mL Intracatheter Q8HRS    NS flush syringe  3 mL Intracatheter Q1H PRN    ondansetron (ZOFRAN) 2 mg/mL injection  4 mg Intravenous Q6H PRN    rosuvastatin (CRESTOR) tablet  20 mg Oral QPM    sennosides (SENNA) tablet  8.6 mg Oral QPM    traZODone (DESYREL) tablet  50 mg Oral NIGHTLY       Labs:      Basic Metabolic Profile    Lab Results   Component Value Date/Time    SODIUM 144 10/23/2022 05:06 AM    POTASSIUM 4.0 10/23/2022 05:06 AM    CHLORIDE 106 10/23/2022 05:06 AM    CO2 28 10/23/2022 05:06 AM    ANIONGAP 10 10/23/2022 05:06 AM    Lab Results   Component Value Date/Time    BUN 13 10/23/2022 05:06 AM    CREATININE 1.05 10/23/2022 05:06 AM        COMPLETE BLOOD COUNT   Lab Results   Component Value Date    WBC 6.7 10/23/2022    HGB 13.6 10/23/2022    HCT 41.8 10/23/2022    PLTCNT 224 10/23/2022     DIFFERENTIAL  Lab Results   Component Value Date    PMNS 61.0 10/23/2022    MONOCYTES 9.0 10/23/2022    BASOPHILS 1.0 10/23/2022    BASOPHILS <0.10 10/23/2022    PMNABS 4.03 10/23/2022    LYMPHSABS 1.70 10/23/2022    EOSABS 0.24 10/23/2022    MONOSABS 0.63 10/23/2022     Lab Results   Component Value Date    CHOLESTEROL 149 10/23/2022    TRIG 108 10/23/2022    LDLCHOL 92 10/23/2022    HDLCHOL 37 (L) 10/23/2022    VLDLCAL 17 10/23/2022     CHOLHDLRATIO 4.0 10/23/2022             Review of reports and notes reveal:  EKG reports:  Normal sinus rhythm  Left axis deviation  Right bundle branch block  Abnormal ECG          Chest x-ray reports:  IMPRESSION:  No consolidation or effusion.      MRI of the brain reports:  IMPRESSION:  Small acute infarct in the right cerebellar hemisphere.  Chronic changes as above.                Assessment/Plan:  Current Modified Rankin Scale: 0 = No symptoms at all  DVT/PE Prophylaxis: recommend chemical prophylaxis; if medical contraindication to this, then SCDs  Antithrombotic: Long -term aspirin 81 mg with additional short course of clopidogrel 75 mg for 21-30 days recommended   Thrombolytic Exclusion: as per chart  Statin: may continue with Crestor 20 mg daily  Etiology: Small-vessel disease (e.g., subcortical or brain stem lacunar infarction <1.5cm)  Blood Pressure: Long-term treatment goal of less than 130/80 while avoiding systolic BP less than 110      In summary, the patient is a 88 y.o. female with acute ischemic stroke.  Patient's stroke is tiny in size, in the right cerebellum and believed to be incidental in nature.  Examination demonstrates no new focal deficits that would appear to correlate with her lesion.  Significant cognitive impairment, however, is appreciated.  Domains of memory, orientation and executive function appear to be particularly affected.  CT scan and MRI of the brain reviewed and demonstrates tiny lesion in right cerebellum as noted above.  As part of her stroke workup, the patient has been ordered a CTA head/neck and echocardiogram for further assessment.  Assuming no significant abnormalities, patient is to continue with stroke management as outlined above.  Therapy services have been ordered for further evaluation, treatment and help with determining disposition.  Personal risk factors for stroke have been reviewed, particularly her HTN, HLD, hypothyroidism.  Recent Covid infection  may be a contributing factor as well.  The patient was informed of all medication changes that were made and the importance of taking these medications. Education has been provided regarding how to recognize stroke signs and symptoms and when to call 9-1-1.  Outpatient PCP follow up is recommended in 1-2 weeks.  Outpatient follow-up with [facility] in 8-10 weeks is recommended as well, just as importantly for further investigation of her ongoing amnesia and cognitive impairment.  MCOT 14 day continuous monitor has been ordered in the meantime for further assessment.  Please feel free to contact with any questions or concerns.        Sidonie Dickens, MD            ADDENDUM:      Echocardiogram reports:  Atrial Septum: The interatrial septum is normal in appearance. Agitated saline bubble study is negative for intracardiac shunt.       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 Recommend further evaluation with carotid duplex ultrasound, versus catheter based cerebral angiogram.  No intracranial large vessel occlusion or high-grade vascular stenosis. Mild diffuse intracranial atherosclerosis.  2.3 cm thyroid nodule, recommend dedicated thyroid ultrasound.  Partially imaged right lung opacities which may be infectious or inflammatory in nature. Clinical correlation is recommended. Can be further characterized with dedicated CT chest as clinically indicated.      Patient's CTA completed with results significant for high grade stenosis of right carotid artery.  Although this would not anatomically correlate with the patient's incidental and tiny right  cerebellar acute infarct, Vascular Surgery has been consulted for further evaluation with carotid ultrasound ordered.  Will defer further management.  Otherwise, echocardiogram negative for shunt and the patient is to continue with aforementioned medical management for stroke risk factor  adjustment.  Please feel free to contact with any questions or concerns.        Sidonie Dickens, MD

## 2022-10-23 NOTE — Care Plan (Signed)
Problem: Stroke, Ischemic (Includes Transient Ischemic Attack)  Goal: Optimal Coping  Outcome: Ongoing (see interventions/notes)  Goal: Effective Bowel Elimination  Outcome: Ongoing (see interventions/notes)  Goal: Optimal Cerebral Tissue Perfusion  Outcome: Ongoing (see interventions/notes)  Goal: Optimal Cognitive Function  Outcome: Ongoing (see interventions/notes)  Goal: Improved Communication Skills  Outcome: Ongoing (see interventions/notes)  Goal: Optimal Functional Ability  Outcome: Ongoing (see interventions/notes)  Goal: Effective Oxygenation and Ventilation  Outcome: Ongoing (see interventions/notes)  Goal: Improved Sensorimotor Function  Outcome: Ongoing (see interventions/notes)  Intervention: Optimize Sensory and Perceptual Ability  Recent Flowsheet Documentation  Taken 10/23/2022 2000 by Waynetta Sandy A, LPN  Pressure Reduction Techniques:   Heels elevated off of the bed   Frequent weight shifting encouraged  Pressure Reduction Devices:   Heel offloading device utilized   Repositioning wedges/pillows utilized  Goal: Optimal Eating and Swallowing without Aspiration  Outcome: Ongoing (see interventions/notes)  Goal: Effective Urinary Elimination  Outcome: Ongoing (see interventions/notes)     Problem: Health Knowledge, Opportunity to Enhance (Adult,Obstetrics,Pediatric)  Goal: Knowledgeable about Health Subject/Topic  Description: Patient will demonstrate the desired outcomes by discharge/transition of care.  Outcome: Ongoing (see interventions/notes)     Problem: Skin Injury Risk Increased  Goal: Skin Health and Integrity  Outcome: Ongoing (see interventions/notes)  Intervention: Optimize Skin Protection  Recent Flowsheet Documentation  Taken 10/23/2022 2000 by Waynetta Sandy A, LPN  Pressure Reduction Techniques:   Heels elevated off of the bed   Frequent weight shifting encouraged  Pressure Reduction Devices:   Heel offloading device utilized   Repositioning wedges/pillows utilized  Skin Protection: adhesive use  limited     Problem: Fall Injury Risk  Goal: Absence of Fall and Fall-Related Injury  Outcome: Ongoing (see interventions/notes)  Intervention: Promote Scientist, clinical (histocompatibility and immunogenetics) Documentation  Taken 10/23/2022 2000 by Risa Grill, LPN  Safety Promotion/Fall Prevention:   activity supervised   fall prevention program maintained   nonskid shoes/slippers when out of bed   safety round/check completed     Problem: Fall Injury Risk  Goal: Absence of Fall and Fall-Related Injury  Outcome: Ongoing (see interventions/notes)  Intervention: Promote Injury-Free Environment  Recent Flowsheet Documentation  Taken 10/23/2022 2000 by Risa Grill, LPN  Safety Promotion/Fall Prevention:   activity supervised   fall prevention program maintained   nonskid shoes/slippers when out of bed   safety round/check completed     Problem: Adult Inpatient Plan of Care  Goal: Plan of Care Review  Outcome: Ongoing (see interventions/notes)  Goal: Patient-Specific Goal (Individualized)  Outcome: Ongoing (see interventions/notes)  Goal: Absence of Hospital-Acquired Illness or Injury  Outcome: Ongoing (see interventions/notes)  Intervention: Identify and Manage Fall Risk  Recent Flowsheet Documentation  Taken 10/23/2022 2000 by Risa Grill, LPN  Safety Promotion/Fall Prevention:   activity supervised   fall prevention program maintained   nonskid shoes/slippers when out of bed   safety round/check completed  Intervention: Prevent Skin Injury  Recent Flowsheet Documentation  Taken 10/23/2022 2000 by Waynetta Sandy A, LPN  Skin Protection: adhesive use limited  Intervention: Prevent and Manage VTE (Venous Thromboembolism) Risk  Recent Flowsheet Documentation  Taken 10/23/2022 2000 by Waynetta Sandy A, LPN  VTE Prevention/Management:   ambulation promoted   anticoagulant therapy maintained   dorsiflexion/plantar flexion performed   sequential compression devices off  Goal: Optimal Comfort and Wellbeing  Outcome: Ongoing (see interventions/notes)  Goal: Rounds/Family  Conference  Outcome: Ongoing (see interventions/notes)

## 2022-10-23 NOTE — Ancillary Notes (Signed)
Medical Nutrition Therapy Assessment    SUBJECTIVE: Bridget [name] is a 88 y.o. female admitted for CVA     Past Medical History:   Diagnosis Date    Arthritis     HTN (hypertension)      9/27: PO intake averaging 75% x 2 meals recorded since admission.     OBJECTIVE: labs and meds reviewed    Wt Readings from Last 5 Encounters:   10/22/22 78.5 kg (173 lb 1 oz)   10/06/21 79.9 kg (176 lb 2.4 oz)   07/12/20 74.4 kg (164 lb)     Current Diet Order/Nutrition Support:  DIET CARDIAC (NO CAFFEINE, 2G NA, LOWFAT, LOW CHOL) Do you want to initiate MNT Protocol? Yes    Physical Assessment:   Height: 160 cm (5\' 3" )  Weight: 78.5 kg (173 lb 1 oz)  Ideal body weight: 52.4 kg (115 lb 8.3 oz)  Adjusted body weight: 58.9 kg  %Ideal body weight: 150%   Body mass index is 30.66 kg/m.    Estimated Needs:  1470-1770 kcal (25-30 kcal/58.9 kg)  52 gm protein (1 gm/ 52.4 kg )GFR 52  1470-1770 ml or fluids per MD    Comments:  - Nutrition assessment per stroke protocol     Education: N/A at this time, patient noted to have some disorientation, memory issues. Will provide stroke education as appropriate.     Pain:  Overall pain rating "patient resting" per nursing record    Plan/Interventions:  - Continue cardiac diet   - Will continue to follow and make further nutritional recommendations prn    Nutrition Diagnosis: No nutrition dx at this time    Goals:    Meet 75% estimated nutritional needs via oral intake  Increase PO to meet 50-75% estimated needs  Show improvement skin integrity  Improve blood glucose to within target range    Follow Up:  4 days    Corene Cornea, RDLD

## 2022-10-23 NOTE — Care Management Notes (Signed)
10/23/22 1012   Assessment Details   Assessment Type Continued Assessment   Date of Care Management Update 10/23/22   Date of Next DCP Update 10/26/22   Readmission   Is this a readmission? No   Insurance Information/Type   Insurance type Medicare   Employment/Financial   Patient has Prescription Coverage?  Yes        Name of Insurance Coverage for Medications [employer]   Financial Concerns none   Living Environment   Select an age group to open "lives with" row.  Adult   Lives With spouse   Living Arrangements house   Able to Return to Prior Arrangements yes   Home Safety   Home Assessment: No Problems Identified   Home Accessibility no concerns   Care Management Plan   Discharge Planning Status plan in progress   Discharge plan discussed with: Patient   Discharge Needs Assessment   Discharge Facility/Level of Care Needs Home (Patient/Family Member/other)(code 1)   Transportation Available car;family or friend will provide   Referral Information   Admission Type inpatient   Address Verified verified-no changes   Arrived From home or self-care     NIH of 3. MRI showed subacute stroke. Neuro consult. Going for echo today. PT/OT to see.

## 2022-10-23 NOTE — Nurses Notes (Signed)
Went in to give pt night meds. However, pt stated that she took her night meds already. And she pointed toward the sink and there was 5 prescription bottles. The 5 bottles are:  Levothyroxine 100 mcg tablet   Lisinopril HCTZ 20-12.5 mg tablet  Pravastatin Sodium tablets, USP 20 mg  Amlodipine Besylate 5 mg tablet  Donepezil HCL 5 mg tablet   She stated taking the three in the back and they were amlodipine, lisinopril and levothyroxine.    Notified the NP tonight about the event. Educated the pt on having to take the medication and give it to pharmacy, pt understood.  Called pharmacy about it also. Sending the medication in the designated bag.   Ezequiel Essex, LPN  6/60/6301 60:10

## 2022-10-23 NOTE — Progress Notes (Signed)
[facility]  Internal Medicine Progress Note  Full Code    Name: Bridget [name]    Date of service: 10/23/2022    Chief Complaint: Abnormal Radiology Test       Subjective   Patient was examined at bedside this am. She is oriented to self and place. Not oriented to time. No complaints this am. Denies fever, chills, chest pain, shortness of breath, abdominal pain, nausea, vomiting, unilateral weakness, paresthesias.       No Known Allergies    Objective     Vital Signs:  Blood pressure (!) 130/45, pulse 64, temperature 36.9 C (98.4 F), resp. rate 17, height 1.6 m (5\' 3" ), weight 78.5 kg (173 lb 1 oz), SpO2 94%.       Physical Exam:  Nursing note and vitals reviewed.  General: Elderly female appearing her stated age lying comfortably in [name] bed. Pleasant and cooperative. No acute distress, alert and oriented to self and place, not oriented to time  HEENT: PERRLA. Sclera white, conjunctiva clear and moist bilaterally. Conversational hearing intact.  Neck: NCAT. Neck supple and trachea midline.  Cardio: Regular rate and rhythm. Normal S1 and S2. No murmurs, rubs, or gallops. No lower extremity edema noted bilaterally. Good pedal pulse bilaterally.   Resp:  Breathing nonlabored. Lungs clear to auscultation bilaterally without wheezes, rales, or rhonchi.   Abd: Abdomen soft and non-tender throughout. No rebound tenderness or involuntary guarding. Bowel sounds present throughout.   GU: Deferred exam  Musculoskeletal: Full ROM in all extremities.  Skin: Skin warm, dry, and intact without lesions.  Neuro: No slurred speech or facial drooping noted. Muscle strength 5/5 throughout. No focal motor or sensory deficits noted. CN 2-12 grossly intact.  Psych: Normal mood and affect. Answers questions appropriately.      Labs:  Results for orders placed or performed during the [name] encounter of 10/22/22 (from the past 24 hour(s))   COMPREHENSIVE METABOLIC PANEL, NON-FASTING   Result Value Ref Range    SODIUM  142 136 - 145 mmol/L    POTASSIUM 3.9 3.5 - 5.1 mmol/L    CHLORIDE 107 96 - 111 mmol/L    CO2 TOTAL 25 23 - 31 mmol/L    ANION GAP 10 4 - 13 mmol/L    BUN 13 8 - 25 mg/dL    CREATININE 5.95 6.38 - 1.05 mg/dL    BUN/CREA RATIO 13 6 - 22    ALBUMIN 3.5 3.4 - 4.8 g/dL     CALCIUM 9.4 8.6 - 75.6 mg/dL    GLUCOSE 433 65 - 295 mg/dL    ALKALINE PHOSPHATASE 90 55 - 145 U/L    ALT (SGPT) 10 <31 U/L    AST (SGOT)  20 11 - 34 U/L    BILIRUBIN TOTAL 0.4 0.3 - 1.3 mg/dL    PROTEIN TOTAL 7.2 6.0 - 8.0 g/dL    ESTIMATED GFR - FEMALE 53 (L) >=60 mL/min/BSA   MAGNESIUM   Result Value Ref Range    MAGNESIUM 1.9 1.8 - 2.6 mg/dL   PT/INR   Result Value Ref Range    PROTHROMBIN TIME 11.4 9.0 - 13.0 seconds    INR 1.05 0.90 - 1.10   PTT (PARTIAL THROMBOPLASTIN TIME)   Result Value Ref Range    APTT 31.7 23.0 - 32.0 seconds   TROPONIN-I   Result Value Ref Range    TROPONIN-I HS 5.6 <=14.0 ng/L ng/L   CBC WITH DIFF   Result Value Ref Range  WBC 7.9 3.7 - 11.0 x10^3/uL    RBC 5.04 3.85 - 5.22 x10^6/uL    HGB 14.3 11.5 - 16.0 g/dL    HCT 38.7 56.4 - 33.2 %    MCV 86.5 78.0 - 100.0 fL    MCH 28.4 26.0 - 32.0 pg    MCHC 32.8 31.0 - 35.5 g/dL    RDW-CV 95.1 88.4 - 16.6 %    PLATELETS 245 150 - 400 x10^3/uL    MPV 9.2 8.7 - 12.5 fL    NEUTROPHIL % 64.0 %    LYMPHOCYTE % 24.0 %    MONOCYTE % 8.0 %    EOSINOPHIL % 3.0 %    BASOPHIL % 1.0 %    NEUTROPHIL # 5.06 1.50 - 7.70 x10^3/uL    LYMPHOCYTE # 1.89 1.00 - 4.80 x10^3/uL    MONOCYTE # 0.60 0.20 - 1.10 x10^3/uL    EOSINOPHIL # 0.24 <=0.50 x10^3/uL    BASOPHIL # <0.10 <=0.20 x10^3/uL    IMMATURE GRANULOCYTE % 0.0 0.0 - 1.0 %    IMMATURE GRANULOCYTE # <0.10 <0.10 x10^3/uL   ECG 12 LEAD   Result Value Ref Range    Ventricular rate 78 BPM    Atrial Rate 78 BPM    PR Interval 190 ms    QRS Duration 140 ms    QT Interval 394 ms    QTC Calculation 449 ms    Calculated P Axis 75 degrees    Calculated R Axis -71 degrees    Calculated T Axis 32 degrees   LIPID PANEL   Result Value Ref Range     CHOLESTEROL  149 100 - 200 mg/dL    HDL CHOL 37 (L) >=06 mg/dL    TRIGLYCERIDES 301 <601 mg/dL    LDL CALC 92 <093 mg/dL    VLDL CALC 17 <23 mg/dL    NON-HDL 557 <=322 mg/dL    CHOL/HDL RATIO 4.0    HGA1C (HEMOGLOBIN A1C WITH EST AVG GLUCOSE)   Result Value Ref Range    HEMOGLOBIN A1C 5.7 <=5.7 %    ESTIMATED AVERAGE GLUCOSE 117 mg/dL   BASIC METABOLIC PANEL, FASTING   Result Value Ref Range    SODIUM 144 136 - 145 mmol/L    POTASSIUM 4.0 3.5 - 5.1 mmol/L    CHLORIDE 106 96 - 111 mmol/L    CO2 TOTAL 28 23 - 31 mmol/L    ANION GAP 10 4 - 13 mmol/L    CALCIUM 9.5 8.6 - 10.3 mg/dL    GLUCOSE 95 70 - 99 mg/dL    BUN 13 8 - 25 mg/dL    CREATININE 0.25 4.27 - 1.05 mg/dL    BUN/CREA RATIO 12 6 - 22    ESTIMATED GFR - FEMALE 52 (L) >=60 mL/min/BSA   MAGNESIUM   Result Value Ref Range    MAGNESIUM 2.0 1.8 - 2.6 mg/dL   CBC WITH DIFF   Result Value Ref Range    WBC 6.7 3.7 - 11.0 x10^3/uL    RBC 4.78 3.85 - 5.22 x10^6/uL    HGB 13.6 11.5 - 16.0 g/dL    HCT 06.2 37.6 - 28.3 %    MCV 87.4 78.0 - 100.0 fL    MCH 28.5 26.0 - 32.0 pg    MCHC 32.5 31.0 - 35.5 g/dL    RDW-CV 15.1 76.1 - 60.7 %    PLATELETS 224 150 - 400 x10^3/uL    MPV 9.5 8.7 - 12.5 fL  NEUTROPHIL % 61.0 %    LYMPHOCYTE % 25.0 %    MONOCYTE % 9.0 %    EOSINOPHIL % 4.0 %    BASOPHIL % 1.0 %    NEUTROPHIL # 4.03 1.50 - 7.70 x10^3/uL    LYMPHOCYTE # 1.70 1.00 - 4.80 x10^3/uL    MONOCYTE # 0.63 0.20 - 1.10 x10^3/uL    EOSINOPHIL # 0.24 <=0.50 x10^3/uL    BASOPHIL # <0.10 <=0.20 x10^3/uL    IMMATURE GRANULOCYTE % 0.0 0.0 - 1.0 %    IMMATURE GRANULOCYTE # <0.10 <0.10 x10^3/uL         No results found for any visits on 10/22/22 (from the past 24 hour(s)).     Radiology:    Results for orders placed or performed during the [name] encounter of 10/22/22   XR AP MOBILE CHEST     Status: None    Narrative    INDICATION:  No chest complaints; patient sent in to be evaluated for abnormal brain MRI.    TECHNIQUE:  Frontal chest was obtained at 12:06 hours.    COMPARISON:  XR  chest 10-06-2021    FINDINGS:    The cardiomediastinal silhouette is normal in size.  There is no consolidation or atelectasis in either lung.  There are no pleural effusions.  There is no pneumothorax.  No acute osseous process.        Impression    No consolidation or effusion.      Signed by Milagros Reap, MD        Inpatient Medications:  acetaminophen (TYLENOL) tablet, 650 mg, Oral, Q4H PRN  amLODIPine (NORVASC) tablet, 5 mg, Oral, Daily  aspirin chewable tablet 81 mg, 81 mg, Oral, Daily  clopidogrel (PLAVIX) 75 mg tablet, 75 mg, Oral, Daily  heparin 5,000 unit/mL injection, 5,000 Units, Subcutaneous, Q8HRS  levothyroxine (SYNTHROID) tablet, 100 mcg, Oral, QAM  NS flush syringe, 3 mL, Intracatheter, Q8HRS  NS flush syringe, 3 mL, Intracatheter, Q1H PRN  ondansetron (ZOFRAN) 2 mg/mL injection, 4 mg, Intravenous, Q6H PRN  rosuvastatin (CRESTOR) tablet, 20 mg, Oral, QPM  sennosides (SENNA) tablet, 8.6 mg, Oral, QPM  traZODone (DESYREL) tablet, 50 mg, Oral, NIGHTLY          Assessment/ Plan:     Bridget [name] is an 88 yr old female with PMH of hypertension, hypothyroidism, and hyperlipidemia who presents with abnormal imaging.       Acute CVA  Had outpatient MRI due to memory issues and found to have right cerebellar acute infarct.   Out of window for intervention  Admitted under stroke powerplan.   Started on daily aspirin, Plavix and started high intensity statin  Bubble echo negative for intracardiac shunt  Neurology consulted, recommendations appreciated.   CTA head/neck ordered.   Speech recommends regular texture solids, thin liquids  PT/OT consult    Hypertension  Continue home amlodipine    Hypothyroidism  Continue home Synthroid        DVT Prophylaxis: SubQ Heparin    Code Status: Full code    Disposition Planning: Pending CTA head/neck, Neuro consult, and PT/OT        Buddy Duty, PA-C      Patient is discussed with Dr. Thedore Mins. I will forward this note for review and further  recommendations.      ADDENDUM:   CTA head neck showing suspected high grade stenosis of the right ICA origin at the carotid bifurcation. Ordered carotid ultrasound, consult vascular.

## 2022-10-24 LAB — CBC WITH DIFF
BASOPHIL #: <0.1 10*3/uL (ref <=0.20)
BASOPHIL %: 1 %
EOSINOPHIL #: 0.28 10*3/uL (ref <=0.50)
EOSINOPHIL %: 3 %
HCT: 41.7 % (ref 34.8–46.0)
HGB: 13.5 g/dL (ref 11.5–16.0)
IMMATURE GRANULOCYTE #: <0.1 10*3/uL (ref <0.10)
IMMATURE GRANULOCYTE %: 1 % (ref 0.0–1.0)
LYMPHOCYTE #: 1.81 10*3/uL (ref 1.00–4.80)
LYMPHOCYTE %: 22 %
MCH: 27.8 pg (ref 26.0–32.0)
MCHC: 32.4 g/dL (ref 31.0–35.5)
MCV: 86 fL (ref 78.0–100.0)
MONOCYTE #: 0.66 10*3/uL (ref 0.20–1.10)
MONOCYTE %: 8 %
MPV: 9.1 fL (ref 8.7–12.5)
NEUTROPHIL #: 5.49 10*3/uL (ref 1.50–7.70)
NEUTROPHIL %: 65 %
PLATELETS: 239 10*3/uL (ref 150–400)
RBC: 4.85 10*6/uL (ref 3.85–5.22)
RDW-CV: 13.5 % (ref 11.5–15.5)
WBC: 8.3 10*3/uL (ref 3.7–11.0)

## 2022-10-24 LAB — BASIC METABOLIC PANEL
ANION GAP: 7 mmol/L (ref 4–13)
BUN/CREA RATIO: 14 (ref 6–22)
BUN: 13 mg/dL (ref 8–25)
CALCIUM: 9.2 mg/dL (ref 8.6–10.3)
CHLORIDE: 106 mmol/L (ref 96–111)
CO2 TOTAL: 27 mmol/L (ref 23–31)
CREATININE: 0.94 mg/dL (ref 0.60–1.05)
ESTIMATED GFR - FEMALE: 59 mL/min/BSA — ABNORMAL LOW (ref >=60)
GLUCOSE: 109 mg/dL (ref 65–125)
POTASSIUM: 3.9 mmol/L (ref 3.5–5.1)
SODIUM: 140 mmol/L (ref 136–145)

## 2022-10-24 LAB — MAGNESIUM: MAGNESIUM: 2 mg/dL (ref 1.8–2.6)

## 2022-10-24 MED ORDER — ASPIRIN 81 MG CHEWABLE TABLET
81.0000 mg | CHEWABLE_TABLET | Freq: Every day | ORAL | 0 refills | Status: AC
Start: 2022-10-25 — End: 2022-12-08

## 2022-10-24 MED ORDER — ROSUVASTATIN 20 MG TABLET: 20.0000 mg | ORAL_TABLET | Freq: Every evening | ORAL | 0 refills | Status: AC

## 2022-10-24 MED ORDER — CLOPIDOGREL 75 MG TABLET: 75.0000 mg | ORAL_TABLET | Freq: Every day | ORAL | 0 refills | Status: AC

## 2022-10-24 NOTE — Care Plan (Signed)
Problem: Stroke, Ischemic (Includes Transient Ischemic Attack)  Goal: Optimal Coping  Outcome: Ongoing (see interventions/notes)  Goal: Effective Bowel Elimination  Outcome: Ongoing (see interventions/notes)  Goal: Optimal Cerebral Tissue Perfusion  Outcome: Ongoing (see interventions/notes)  Goal: Optimal Cognitive Function  Outcome: Ongoing (see interventions/notes)  Goal: Improved Communication Skills  Outcome: Ongoing (see interventions/notes)  Goal: Optimal Functional Ability  Outcome: Ongoing (see interventions/notes)  Intervention: Optimize Functional Ability  Recent Flowsheet Documentation  Taken 10/24/2022 0800 by Rush Farmer, RN  Self-Care Promotion: independence encouraged  Activity Management: ambulated in room  Goal: Effective Oxygenation and Ventilation  Outcome: Ongoing (see interventions/notes)  Goal: Improved Sensorimotor Function  Outcome: Ongoing (see interventions/notes)  Intervention: Optimize Sensory and Perceptual Ability  Recent Flowsheet Documentation  Taken 10/24/2022 0800 by Rush Farmer, RN  Pressure Reduction Techniques: Heels elevated off of the bed  Pressure Reduction Devices: Heel offloading device utilized  Goal: Optimal Eating and Swallowing without Aspiration  Outcome: Ongoing (see interventions/notes)  Goal: Effective Urinary Elimination  Outcome: Ongoing (see interventions/notes)     Problem: Health Knowledge, Opportunity to Enhance (Adult,Obstetrics,Pediatric)  Goal: Knowledgeable about Health Subject/Topic  Description: Patient will demonstrate the desired outcomes by discharge/transition of care.  Outcome: Ongoing (see interventions/notes)     Problem: Skin Injury Risk Increased  Goal: Skin Health and Integrity  Outcome: Ongoing (see interventions/notes)  Intervention: Optimize Skin Protection  Recent Flowsheet Documentation  Taken 10/24/2022 0800 by Rush Farmer, RN  Pressure Reduction Techniques: Heels elevated off of the bed  Pressure Reduction Devices: Heel offloading device  utilized  Skin Protection: adhesive use limited  Activity Management: ambulated in room     Problem: Fall Injury Risk  Goal: Absence of Fall and Fall-Related Injury  Outcome: Ongoing (see interventions/notes)  Intervention: Identify and Manage Contributors  Recent Flowsheet Documentation  Taken 10/24/2022 0800 by Rush Farmer, RN  Self-Care Promotion: independence encouraged  Intervention: Promote Injury-Free Environment  Recent Flowsheet Documentation  Taken 10/24/2022 0800 by Rush Farmer, RN  Safety Promotion/Fall Prevention: activity supervised     Problem: Fall Injury Risk  Goal: Absence of Fall and Fall-Related Injury  Outcome: Ongoing (see interventions/notes)  Intervention: Identify and Manage Contributors  Recent Flowsheet Documentation  Taken 10/24/2022 0800 by Rush Farmer, RN  Self-Care Promotion: independence encouraged  Intervention: Promote Injury-Free Environment  Recent Flowsheet Documentation  Taken 10/24/2022 0800 by Rush Farmer, RN  Safety Promotion/Fall Prevention: activity supervised     Problem: Adult Inpatient Plan of Care  Goal: Plan of Care Review  Outcome: Ongoing (see interventions/notes)  Goal: Patient-Specific Goal (Individualized)  Outcome: Ongoing (see interventions/notes)  Goal: Absence of Hospital-Acquired Illness or Injury  Outcome: Ongoing (see interventions/notes)  Intervention: Identify and Manage Fall Risk  Recent Flowsheet Documentation  Taken 10/24/2022 0800 by Rush Farmer, RN  Safety Promotion/Fall Prevention: activity supervised  Intervention: Prevent Skin Injury  Recent Flowsheet Documentation  Taken 10/24/2022 0800 by Rush Farmer, RN  Skin Protection: adhesive use limited  Goal: Optimal Comfort and Wellbeing  Outcome: Ongoing (see interventions/notes)  Goal: Rounds/Family Conference  Outcome: Ongoing (see interventions/notes)     Problem: Hypertension Comorbidity  Goal: Blood Pressure in Desired Range  Outcome: Ongoing (see interventions/notes)   Beather Arbour, RN

## 2022-10-24 NOTE — Nurses Notes (Signed)
Patient discharged home with family.  AVS reviewed with patient/care giver.  A written copy of the AVS and discharge instructions was given to the patient/care giver.  Questions sufficiently answered as needed.  Patient/care giver encouraged to follow up with PCP as indicated.  In the event of an emergency, patient/care giver instructed to call 911 or go to the nearest emergency room.

## 2022-10-24 NOTE — Discharge Summary (Signed)
[facility] Medicine [facility]  Department of Hospitalist Medicine    DISCHARGE SUMMARY      PATIENT NAME:  Bridget [name], Bridget [name]  MRN:  [id number]  DOB:  09-29-1934  INPATIENT ADMISSION DATE: 10/22/2022  DISCHARGE DATE: 10/24/22     DISCHARGING PHYSICIAN : Joellyn Rued, MD  PRIMARY CARE PHYSICIAN: Selinda Eon, MD     DISCHARGE DIAGNOSIS:   Patient Active Problem List    Diagnosis     Right cavernous carotid stenosis     Hypertension, unspecified type     Hyperlipidemia, unspecified hyperlipidemia type     Acute ischemic stroke (CMS [facility])     Cognitive impairment     Cerebrovascular accident (CVA), unspecified mechanism (CMS [facility])          HOSPITAL COURSE:  Patient was admitted from outpatient MRI for findings of acute stroke.  She was evaluated by the Neurology team.  Patient was asymptomatic.  On CT imaging of the head and neck patient was found to have significant right carotid stenosis.  Patient completely asymptomatic at this time.  Vascular surgery was called with the patient opted to have follow up with vascular surgery on outpatient basis.  Referral was made and the patient will have follow up with vascular surgery and PCP.  Further assessment and plan above    Labs and Diagnostic Studies:  All labs and diagnostic studies reviewed    DISCHARGE MEDICATIONS:     Current Discharge Medication List        START taking these medications.        Details   aspirin 81 mg Tablet, Chewable  Start taking on: October 25, 2022   81 mg, Oral, DAILY  Qty: 30 Tablet  Refills: 0     clopidogreL 75 mg Tablet  Commonly known as: PLAVIX  Start taking on: October 25, 2022   75 mg, Oral, DAILY  Qty: 30 Tablet  Refills: 0     rosuvastatin 20 mg Tablet  Commonly known as: CRESTOR   20 mg, Oral, EVERY EVENING  Qty: 30 Tablet  Refills: 0            CONTINUE these medications - NO CHANGES were made during your visit.        Details   amLODIPine 5 mg Tablet  Commonly known as: NORVASC   TAKE 1 TABLET BY MOUTH EVERY DAY. STOP TAKING  THE AMLODIPINE 2.5MG   Refills: 0     donepeziL 5 mg Tablet  Commonly known as: ARICEPT   5 mg, Oral, NIGHTLY  Refills: 0     levothyroxine 100 mcg Tablet  Commonly known as: SYNTHROID   TAKE 1 TABLET BY MOUTH FIRST THING IN THE MORNING ONE HOUR BEFORE BREAKFAST ON AN EMPTY STOMACH  Refills: 0     lisinopriL-hydrochlorothiazide 20-12.5 mg Tablet  Commonly known as: ZESTORETIC   1 Tablet, Oral, DAILY  Refills: 0     risedronate 150 mg Tablet  Commonly known as: ACTONEL   150 mg, Oral, PER INSTRUCTIONS, PLEASE SEE ATTACHED FOR DETAILED DIRECTIONS  Refills: 0            STOP taking these medications.      pravastatin 20 mg Tablet  Commonly known as: PRAVACHOL              DISCHARGE INSTRUCTIONS:      DISCHARGE INSTRUCTION - CARDIAC DIET     Diet: CARDIAC DIET       Follow-up Information  Inkollu, Sashi, MD Follow up in 1 week(s).    Specialty: VASCULAR SURGERY  Contact information:  [address]  [address]  [place] [address]  [phone]                                   CONDITION ON DISCHARGE: Stable  DISCHARGE DISPOSITION: home    Total Time Spent:   Total time including medical records review; interviewing, examining and counseling the patient; coordinating care with other physicians, nursing, respiratory therapy and other clinical staff; and coordinating care with the multidisciplinary team was greater than 35 minutes.     Joellyn Rued, MD

## 2022-10-24 NOTE — Hospital Course (Signed)
Patient was admitted from outpatient MRI for findings of acute stroke.  She was evaluated by the Neurology team.  Patient was asymptomatic.  On CT imaging of the head and neck patient was found to have significant right carotid stenosis.  Patient completely asymptomatic at this time.  Vascular surgery was called with the patient opted to have follow up with vascular surgery on outpatient basis.  Referral was made and the patient will have follow up with vascular surgery and PCP.  Further assessment and plan above

## 2022-10-24 NOTE — Care Management Notes (Signed)
10/24/22 1023   Assessment Details   Assessment Type Continued Assessment   Date of Care Management Update 10/24/22   Medicare Intent to Discharge Documentation   Reason IMM "not applicable": Patient's Discharge date and time less than 48hrs from documented date and time of delviery for the "Admission IMM"   Care Management Plan   Discharge Planning Status discharge plan complete   Projected Discharge Date 10/24/22   Discharge Needs Assessment   Discharge Facility/Level of Care Needs Home (Patient/Family Member/other)(code 1)   Transportation Available car;family or friend will provide     Patient medically cleared for DC. Patient will return home via PV. No DC needs.    Discharge Recommendations/ Plan:Discharge NU:UVOZ (Patient/Family Member/other) (code 1)      Clent Ridges, SOCIAL WORKER

## 2022-10-29 ENCOUNTER — Other Ambulatory Visit (HOSPITAL_COMMUNITY): Payer: Self-pay | Admitting: Family

## 2022-10-29 ENCOUNTER — Ambulatory Visit (HOSPITAL_COMMUNITY): Payer: Self-pay

## 2022-10-29 DIAGNOSIS — E041 Nontoxic single thyroid nodule: Secondary | ICD-10-CM

## 2022-11-10 ENCOUNTER — Inpatient Hospital Stay
Admission: RE | Admit: 2022-11-10 | Discharge: 2022-11-10 | Disposition: A | Discharge status: Home / Self Care | Payer: Medicare (Managed Care) | Source: Ambulatory Visit | Attending: Family | Admitting: Family

## 2022-11-10 ENCOUNTER — Other Ambulatory Visit: Payer: Self-pay

## 2022-11-10 DIAGNOSIS — E041 Nontoxic single thyroid nodule: Secondary | ICD-10-CM | POA: Insufficient documentation

## 2022-11-11 ENCOUNTER — Inpatient Hospital Stay
Admission: RE | Admit: 2022-11-11 | Discharge: 2022-11-11 | Disposition: A | Discharge status: Home / Self Care | Payer: Medicare (Managed Care) | Source: Ambulatory Visit

## 2022-11-11 DIAGNOSIS — I639 Cerebral infarction, unspecified: Secondary | ICD-10-CM | POA: Insufficient documentation

## 2022-12-04 DIAGNOSIS — I639 Cerebral infarction, unspecified: Secondary | ICD-10-CM

## 2022-12-04 DIAGNOSIS — I491 Atrial premature depolarization: Secondary | ICD-10-CM

## 2022-12-04 DIAGNOSIS — I454 Nonspecific intraventricular block: Secondary | ICD-10-CM

## 2022-12-04 DIAGNOSIS — I472 Ventricular tachycardia, unspecified (CMS HCC): Secondary | ICD-10-CM

## 2022-12-04 DIAGNOSIS — I480 Paroxysmal atrial fibrillation: Secondary | ICD-10-CM

## 2022-12-04 LAB — 14 DAY EXTENDED HOLTER MONITOR
Heart rate (average): 71 {beats}/min
Isolated SVE count: 21710 episodes
Isolated VE Counts: 10200 episodes
Longest supraventricular tachycardia episode - duration: 17.6 s
Longest supraventricular tachycardia episode - heart rate (: 112 {beats}/min
Longest supraventricular tachycardia episode - number of be: 32 beats
Longest ventricular tachycardia episode - duration: 10.6
Longest ventricular tachycardia episode - heart rate (avera: 155
Longest ventricular tachycardia episode - number of beats: 25 beats
SVE Couplets Counts: 452 episodes
SVE Triplets Counts: 102 episodes
Supraventricular tachycardia - heart rate (average): 130 {beats}/min
Supraventricular tachycardia - number of episodes: 123
Supraventricular tachycardia with fastest heart rate - dura: 2.7 s
Supraventricular tachycardia with fastest heart rate - hear: 167 {beats}/min
Supraventricular tachycardia with fastest heart rate - numb: 7 beats
VE Couplets Counts: 203 episodes
VE Triplets Counts: 22 episodes
Ventricular tachycardia - heart rate (average): 136 {beats}/min
Ventricular tachycardia - number of episodes: 2
Ventricular tachycardia with fastest heart rate - duration: 10.6 s
Ventricular tachycardia with fastest heart rate - heart rat: 155
Ventricular tachycardia with fastest heart rate - number of: 25 beats

## 2022-12-08 ENCOUNTER — Encounter (INDEPENDENT_AMBULATORY_CARE_PROVIDER_SITE_OTHER): Payer: Self-pay | Admitting: Surgery

## 2022-12-08 ENCOUNTER — Ambulatory Visit: Payer: Medicare (Managed Care) | Attending: Surgery | Admitting: Surgery

## 2022-12-08 ENCOUNTER — Other Ambulatory Visit: Payer: Self-pay

## 2022-12-08 VITALS — BP 117/69 | HR 84 | Ht 63.0 in | Wt 173.0 lb

## 2022-12-08 DIAGNOSIS — I6521 Occlusion and stenosis of right carotid artery: Secondary | ICD-10-CM | POA: Insufficient documentation

## 2022-12-08 NOTE — Progress Notes (Addendum)
Department of Vascular Surgery  [facility]  Outpatient Clinic History and Physical    Date: 12/08/2022  Patient: Bridget [name] [facility]  DOB: [birth year]  PCP: Selinda Eon, MD    Chief Complaint:   Chief Complaint   Patient presents with    New Patient     Est care after duplex        Subjective:     HPI: Bridget [name] is a 88 y.o. White female who presents for evaluation of right carotid artery stenosis.  This was found incidentally after the patient was evaluated with a MRI by her PCP secondary to some cognitive decline.  This demonstrated a right cerebellar stroke.  The patient was sent to the hospital for this.  At that time she underwent a CTA which demonstrated a severe right-sided carotid artery stenosis as well as a duplex at that time.  She was asymptomatic from all of the findings.  Neurology saw the patient felt that the stroke was not related to the carotid artery the patient was started on aspirin Plavix and statin and then was discharged home with outpatient follow up.  Since that time the patient has not had any stroke-like symptoms.  She has no amaurosis no TIAs.  She is a nonsmoker.  She lives at home with the husband.    I have reviewed referring provider documentation related to her inpatient admission as well as neurology notes for the cerebellar stroke    PMH:   Past Medical History:   Diagnosis Date    Arthritis     HTN (hypertension)            Family Hx:  Family Medical History:       Problem Relation (Age of Onset)    No Known Problems Mother, Father, Sister, Brother, Maternal Grandmother, Maternal Grandfather, Paternal Grandmother, Paternal Grandfather, Daughter, Son, Maternal Aunt, Maternal Uncle, Paternal Aunt, Paternal Uncle, Other              Medications:  Current Outpatient Medications   Medication Sig Dispense Refill    amLODIPine (NORVASC) 5 mg Oral Tablet TAKE 1 TABLET BY MOUTH EVERY DAY. STOP TAKING THE AMLODIPINE 2.5MG       aspirin 81 mg Oral Tablet, Chewable Chew  1 Tablet (81 mg total) Once a day for 30 days 30 Tablet 0    clopidogreL (PLAVIX) 75 mg Oral Tablet Take 1 Tablet (75 mg total) by mouth Once a day for 30 days 30 Tablet 0    donepeziL (ARICEPT) 5 mg Oral Tablet Take 1 Tablet (5 mg total) by mouth Every night      levothyroxine (SYNTHROID) 100 mcg Oral Tablet TAKE 1 TABLET BY MOUTH FIRST THING IN THE MORNING ONE HOUR BEFORE BREAKFAST ON AN EMPTY STOMACH      lisinopriL-hydrochlorothiazide (ZESTORETIC) 20-12.5 mg Oral Tablet Take 1 Tablet by mouth Once a day      MULTI-VITAMIN ORAL Take by mouth      risedronate (ACTONEL) 150 mg Oral Tablet Take 1 Tablet (150 mg total) by mouth Per Instructions PLEASE SEE ATTACHED FOR DETAILED DIRECTIONS      rosuvastatin (CRESTOR) 20 mg Oral Tablet Take 1 Tablet (20 mg total) by mouth Every evening for 30 days 30 Tablet 0     No current facility-administered medications for this visit.       Allergies:  No Known Allergies    Past Surgical Hx:  Past Surgical History:   Procedure Laterality Date    KNEE  SURGERY             Social Hx:  Social History     Socioeconomic History    Marital status: Married     Spouse name: Not on file    Number of children: Not on file    Years of education: Not on file    Highest education level: Not on file   Occupational History    Not on file   Tobacco Use    Smoking status: Never    Smokeless tobacco: Never   Vaping Use    Vaping status: Never Used   Substance and Sexual Activity    Alcohol use: Never    Drug use: Never    Sexual activity: Not on file   Other Topics Concern    Not on file   Social History Narrative    Not on file     Social Determinants of Health     Financial Resource Strain: Not on file   Transportation Needs: Not on file   Social Connections: Low Risk  (10/22/2022)    Social Connections     SDOH Social Isolation: 5 or more times a week   Intimate Partner Violence: Not on file   Housing Stability: Not on file       Review of Systems:  Constitutional: negative for fevers, chills,  sweats and fatigue  Eyes: negative for blurred vision or field defects  HEENT: negative for hearing loss  Respiratory: negative for cough and shortness of breath  Cardiovascular: negative for chest pain, negative for claudication  Gastrointestinal: negative for post prandial pain  Genitourinary: negative for dysuria, continues to urinate  Musculoskeletal: As in HPI  Neurological: negative for unilateral weakness or monocular blindness  Integumentary: negative for wounds or ulcers    Objective:    PHYSICAL EXAM:  Vitals: BP 117/69   Pulse 84   Ht 1.6 m (5\' 3" )   Wt 78.5 kg (173 lb)   SpO2 94%   BMI 30.65 kg/m       General: AA&O X3. Nontoxic.  Well developed and well nourished in no acute distress   HENT: Head is normocephalic, atraumatic   Eyes: EOM grossly intact.    Neck: Normal ROM, Supple, symmetrical  Lungs: Effort normal  Cardiovascular: Regular rate and rhythm  Vascular:     Left radial artery:  2+ (normal),  Right radial artery:  2+ (normal),       DATA:     I have independently reviewed and interpreted available imaging.  My interpretations are summarized below.      DIAGNOSTIC STUDIES REVIEWED:  CTA was personally reviewed and interpreted by myself which demonstrates a severe right-sided carotid artery stenosis and a mild left-sided carotid artery stenosis.    Carotid duplex demonstrates severe stenosis on the right mild on the left    Assessment:  This is an 88 year female with a asymptomatic severe right-sided carotid artery stenosis.    Plan:  Given the patient's age as well as relatively minimal risk factors as well as isolated right-sided disease also not have not been on a statin or aspirin prior to this event.  I feel that the risk of surgery outweighs the benefit of the patient we will see.  I had an extended discussion with the patient her daughter and husband about this and then recommended ongoing medical management with aspirin statin and blood pressure control.  Anatomically and  medically she is a acceptable candidate for an endarterectomy  that given her advanced age I am concerned about the risks of general anesthesia as well as prophylactic endarterectomy for stroke prevention    Patient was given the opportunity to ask questions and those questions were answered to their satisfaction. Instructed to call with any further questions or concerns.     Burgess Estelle, MD

## 2022-12-22 ENCOUNTER — Telehealth (INDEPENDENT_AMBULATORY_CARE_PROVIDER_SITE_OTHER): Payer: Self-pay | Admitting: Neurology

## 2022-12-22 NOTE — Telephone Encounter (Signed)
T/C to patent to get appointment scheduled. Patient states that she cannot schedule in March or April because she will be in [place]. She would rather wait until she comes back to call and get scheduled.

## 2023-06-15 ENCOUNTER — Encounter (INDEPENDENT_AMBULATORY_CARE_PROVIDER_SITE_OTHER): Payer: Self-pay

## 2023-10-12 ENCOUNTER — Telehealth (INDEPENDENT_AMBULATORY_CARE_PROVIDER_SITE_OTHER): Payer: Self-pay

## 2023-10-12 NOTE — Telephone Encounter (Signed)
 [facility] INSTITUTE  CARDIOLOGY, EUSEBIO HOTTER  [address]  [place] [address]  [phone]  Bridget [name]  Date of Service:  10/12/2023  MRN:  [id number]    Attempted to contact patient to reschedule appointment with [facility], no answer. I left a detailed voicemail for patient explaining why the appointment has been canceled. I let the patient know they need to call back to reschedule. I left the main clinic number to call back.  Can r/s pt to 12/07/23 with [facility].    Bridget Cline, MA  10/12/2023, 11:32

## 2023-11-10 ENCOUNTER — Other Ambulatory Visit (HOSPITAL_COMMUNITY): Payer: Self-pay | Admitting: Family

## 2023-11-10 ENCOUNTER — Other Ambulatory Visit: Payer: Self-pay

## 2023-11-10 ENCOUNTER — Ambulatory Visit
Admission: RE | Admit: 2023-11-10 | Discharge: 2023-11-10 | Disposition: A | Discharge status: Home / Self Care | Source: Ambulatory Visit | Attending: Family | Admitting: Family

## 2023-11-10 DIAGNOSIS — M25562 Pain in left knee: Secondary | ICD-10-CM

## 2023-11-10 DIAGNOSIS — M25561 Pain in right knee: Secondary | ICD-10-CM | POA: Insufficient documentation

## 2023-11-11 DIAGNOSIS — M1712 Unilateral primary osteoarthritis, left knee: Secondary | ICD-10-CM

## 2023-11-11 DIAGNOSIS — Z96651 Presence of right artificial knee joint: Secondary | ICD-10-CM

## 2023-11-11 NOTE — Progress Notes (Signed)
 The patient did not appear for their appointment/or scheduled appointment was cancelled.  This office visit opened in error.

## 2023-11-12 ENCOUNTER — Telehealth (INDEPENDENT_AMBULATORY_CARE_PROVIDER_SITE_OTHER): Payer: Self-pay | Admitting: Adult Health

## 2023-11-12 NOTE — Telephone Encounter (Signed)
 [facility] MEDICINE SPECIALTY CARE   NEUROLOGY  [address]  [place] [address]  [phone]  Bridget [name]  Date of Service:  11/12/2023  MRN:  [id number]    Left message with neurology appointment reminder for 11/15/2023, arrival 8:15. Advised to bring insurance card, photo ID and medication list. Also told we are on 1st floor in annex building.    [name]  11/12/2023, 08:32

## 2023-11-15 ENCOUNTER — Ambulatory Visit (INDEPENDENT_AMBULATORY_CARE_PROVIDER_SITE_OTHER): Payer: Self-pay | Admitting: Adult Health

## 2023-11-15 ENCOUNTER — Telehealth (INDEPENDENT_AMBULATORY_CARE_PROVIDER_SITE_OTHER): Payer: Self-pay

## 2023-11-15 DIAGNOSIS — Z029 Encounter for administrative examinations, unspecified: Secondary | ICD-10-CM

## 2023-11-15 NOTE — Telephone Encounter (Signed)
 [facility]  CARDIOLOGY, EUSEBIO HOTTER  [address]  [place] [address]  [phone]  [name]  Date of Service:  11/15/2023  MRN:  [id number]    Called patient and patients husband to notify of testing needing to be completed before their upcomming appointment. If patient fails to have testing completed before follow up appointment then the appointment will need canceled. Igave the scheduling number  [phone] opt. 2) and i also left our clinic number [phone]) if they need to r/s.    [name], MA  11/15/2023, 11:02

## 2023-11-17 ENCOUNTER — Ambulatory Visit (HOSPITAL_BASED_OUTPATIENT_CLINIC_OR_DEPARTMENT_OTHER): Payer: Self-pay

## 2023-11-17 NOTE — Telephone Encounter (Addendum)
 Patient does not know where her surgery was because it was so long ago.        Given ok to schedule next new      ----- Message from [facility] H sent at 11/17/2023  1:41 PM EDT -----  Regarding: RE: Clinical Question  ----- Message from [facility] H sent at 11/17/2023  1:04 PM EDT -----  Copied From CRM #[id number].  regina from Dr. squire office (Other) called with a clinical question.   New referral    Pt being referred by  Dr. Rexene Adler    Requesting Dr . TERRY   Needing seen for Right knee pain    Previous surgery or related procedures? Right knee replacement years ago  Year - unknown   Recent Xrays/images are in yes in EPIC  BMI 28.9 Estimated body mass index is 30.65 kg/m as calculated from the following:    Height as of 12/08/22: 1.6 m (5' 3).    Weight as of 12/08/22: 78.5 kg (173 lb).     External records being faxed to [facility]  Referring is aware we will not contact pt until records are received.

## 2023-11-24 ENCOUNTER — Telehealth (INDEPENDENT_AMBULATORY_CARE_PROVIDER_SITE_OTHER): Payer: Self-pay

## 2023-11-24 NOTE — Telephone Encounter (Signed)
 [facility]  CARDIOLOGY, EUSEBIO HOTTER  [address]  [place] [address]  [phone]  Bridget [name]  Date of Service:  11/24/2023  MRN:  [id number]    Spoke to patient and patients husband again today. I explained they must schedule the testing (duplex) their selves and it needs to be completed before her upcoming appointment. If testing appointment is not before office appointment they will need to r/s with the office.     [name], MA  11/24/2023, 14:23

## 2023-12-01 ENCOUNTER — Other Ambulatory Visit: Payer: Self-pay

## 2023-12-01 ENCOUNTER — Ambulatory Visit
Admission: RE | Admit: 2023-12-01 | Discharge: 2023-12-01 | Disposition: A | Discharge status: Home / Self Care | Payer: Self-pay | Source: Ambulatory Visit | Attending: Surgery | Admitting: Surgery

## 2023-12-01 DIAGNOSIS — I6521 Occlusion and stenosis of right carotid artery: Secondary | ICD-10-CM | POA: Insufficient documentation

## 2023-12-01 DIAGNOSIS — I6523 Occlusion and stenosis of bilateral carotid arteries: Secondary | ICD-10-CM

## 2023-12-07 ENCOUNTER — Other Ambulatory Visit: Payer: Self-pay

## 2023-12-07 ENCOUNTER — Encounter (INDEPENDENT_AMBULATORY_CARE_PROVIDER_SITE_OTHER): Payer: Self-pay | Admitting: Surgery

## 2023-12-07 ENCOUNTER — Ambulatory Visit (INDEPENDENT_AMBULATORY_CARE_PROVIDER_SITE_OTHER): Payer: Self-pay | Admitting: Surgery

## 2023-12-07 ENCOUNTER — Ambulatory Visit: Attending: Surgery | Admitting: Surgery

## 2023-12-07 VITALS — BP 157/79 | HR 70 | Ht 63.0 in | Wt 173.0 lb

## 2023-12-07 DIAGNOSIS — I6521 Occlusion and stenosis of right carotid artery: Secondary | ICD-10-CM | POA: Insufficient documentation

## 2023-12-07 DIAGNOSIS — I10 Essential (primary) hypertension: Secondary | ICD-10-CM

## 2023-12-07 NOTE — Progress Notes (Signed)
 Department of Vascular Surgery  Bridget [name]  [facility]  [address]  [name] [address]  Operated by [facility]  Outpatient Clinic Follow Up    Date: 12/07/2023  Patient: Bridget [name] [facility]  DOB: 12-Sep-1934  PCP: Deward Squibb, MD    Chief Complaint:   Chief Complaint   Patient presents with    Follow Up     Stenosis of right carotid artery      Follow-up After Testing     duplex       Subjective:     HPI: Bridget [name] is a 89 y.o. White female who presents for follow up for severe right carotid artery stenosis.  The patient has recently been diagnosed with a dementia.  She has no CVA TIAs or amaurosis type symptoms in the past year.  She remains compliant with the Plavix  and statin.    PMH:   Past Medical History:   Diagnosis Date    Arthritis     HTN (hypertension)            Family Hx:  Family Medical History:       Problem Relation (Age of Onset)    No Known Problems Mother, Father, Sister, Brother, Maternal Grandmother, Maternal Grandfather, Paternal Grandmother, Paternal Grandfather, Daughter, Son, Maternal Aunt, Maternal Uncle, Paternal Aunt, Paternal Uncle, Other              Medications:  Current Outpatient Medications   Medication Sig Dispense Refill    amLODIPine  (NORVASC ) 5 mg Oral Tablet TAKE 1 TABLET BY MOUTH EVERY DAY. STOP TAKING THE AMLODIPINE  2.5MG       clopidogreL  (PLAVIX ) 75 mg Oral Tablet Take 1 Tablet (75 mg total) by mouth Once a day for 30 days 30 Tablet 0    donepeziL (ARICEPT) 5 mg Oral Tablet Take 1 Tablet (5 mg total) by mouth Every night      levothyroxine  (SYNTHROID ) 100 mcg Oral Tablet TAKE 1 TABLET BY MOUTH FIRST THING IN THE MORNING ONE HOUR BEFORE BREAKFAST ON AN EMPTY STOMACH      lisinopriL-hydrochlorothiazide (ZESTORETIC) 20-12.5 mg Oral Tablet Take 1 Tablet by mouth Once a day      MULTI-VITAMIN ORAL Take by mouth      risedronate (ACTONEL) 150 mg Oral Tablet Take 1 Tablet (150 mg total) by mouth Per Instructions  PLEASE SEE ATTACHED FOR DETAILED DIRECTIONS      rosuvastatin  (CRESTOR ) 20 mg Oral Tablet Take 1 Tablet (20 mg total) by mouth Every evening for 30 days 30 Tablet 0     No current facility-administered medications for this visit.       Allergies:  Allergies[1]    Past Surgical Hx:  Past Surgical History:   Procedure Laterality Date    KNEE SURGERY             Social Hx:  Social History     Socioeconomic History    Marital status: Married     Spouse name: Not on file    Number of children: Not on file    Years of education: Not on file    Highest education level: Not on file   Occupational History    Not on file   Tobacco Use    Smoking status: Never    Smokeless tobacco: Never   Vaping Use    Vaping status: Never Used   Substance and Sexual Activity    Alcohol use: Never    Drug use: Never  Sexual activity: Not on file   Other Topics Concern    Not on file   Social History Narrative    Not on file     Social Determinants of Health     Financial Resource Strain: Not on file   Transportation Needs: Not on file   Social Connections: Low Risk (10/22/2022)    Social Connections     SDOH Social Isolation: 5 or more times a week   Intimate Partner Violence: Not on file   Housing Stability: Not on file       Review of Systems:  Constitutional: negative for fevers, chills, sweats and fatigue  Eyes: negative for blurred vision or field defects  HEENT: negative for hearing loss  Respiratory: negative for cough and shortness of breath  Cardiovascular: negative for chest pain, negative for claudication  Gastrointestinal: negative for post prandial pain  Genitourinary: negative for dysuria, continues to urinate  Musculoskeletal: As in HPI  Neurological: negative for unilateral weakness or monocular blindness  Integumentary: negative for wounds or ulcers    Objective:    PHYSICAL EXAM:  Vitals: BP (!) 157/79   Pulse 70   Ht 1.6 m (5' 3)   Wt 78.5 kg (173 lb)   BMI 30.65 kg/m       General: AA&O X3. Nontoxic.  Well developed  and well nourished in no acute distress   HENT: Head is normocephalic, atraumatic   Eyes: EOM grossly intact.    Neck: Normal ROM, Supple, symmetrical  Lungs: Effort normal  Cardiovascular: Regular rate and rhythm  Vascular:     Left radial artery:  2+ (normal),  Right radial artery:  2+ (normal),         DATA:     I have independently reviewed and interpreted available imaging.  My interpretations are summarized below.      DIAGNOSTIC STUDIES REVIEWED:  Carotid duplex demonstrates a mild stenosis on the left-hand an occluded carotid artery on the right    Assessment:  This is an 89 year old female with a right carotid artery occlusion.    Plan:  The patient is asymptomatic from this occlusion of the time I have recommended we continue maximal medical therapy.  We will plan to see the patient back in 1 year with a carotid artery duplex    Patient was given the opportunity to ask questions and those questions were answered to their satisfaction. Instructed to call with any further questions or concerns.     [name], MD             [1] No Known Allergies

## 2023-12-21 ENCOUNTER — Ambulatory Visit (INDEPENDENT_AMBULATORY_CARE_PROVIDER_SITE_OTHER): Payer: Self-pay | Admitting: Surgery

## 2024-01-13 ENCOUNTER — Ambulatory Visit: Payer: Self-pay | Admitting: Orthopaedic Surgery

## 2024-01-28 ENCOUNTER — Emergency Department (HOSPITAL_COMMUNITY)

## 2024-01-28 ENCOUNTER — Inpatient Hospital Stay
Admission: EM | Admit: 2024-01-28 | Discharge: 2024-01-30 | DRG: 521 | Disposition: A | Discharge status: Other Acute Inpatient Hospital | Attending: HOSPITALIST-INTERNAL MEDICINE | Admitting: HOSPITALIST-INTERNAL MEDICINE

## 2024-01-28 ENCOUNTER — Encounter (HOSPITAL_COMMUNITY): Payer: Self-pay

## 2024-01-28 ENCOUNTER — Other Ambulatory Visit: Payer: Self-pay

## 2024-01-28 DIAGNOSIS — E039 Hypothyroidism, unspecified: Secondary | ICD-10-CM | POA: Diagnosis present

## 2024-01-28 DIAGNOSIS — G3184 Mild cognitive impairment, so stated: Secondary | ICD-10-CM

## 2024-01-28 DIAGNOSIS — S92919A Unspecified fracture of unspecified toe(s), initial encounter for closed fracture: Secondary | ICD-10-CM

## 2024-01-28 DIAGNOSIS — S92502A Displaced unspecified fracture of left lesser toe(s), initial encounter for closed fracture: Secondary | ICD-10-CM | POA: Diagnosis present

## 2024-01-28 DIAGNOSIS — R936 Abnormal findings on diagnostic imaging of limbs: Secondary | ICD-10-CM

## 2024-01-28 DIAGNOSIS — W010XXA Fall on same level from slipping, tripping and stumbling without subsequent striking against object, initial encounter: Secondary | ICD-10-CM | POA: Diagnosis present

## 2024-01-28 DIAGNOSIS — E785 Hyperlipidemia, unspecified: Secondary | ICD-10-CM | POA: Diagnosis present

## 2024-01-28 DIAGNOSIS — S72012A Unspecified intracapsular fracture of left femur, initial encounter for closed fracture: Principal | ICD-10-CM | POA: Diagnosis present

## 2024-01-28 DIAGNOSIS — Z043 Encounter for examination and observation following other accident: Secondary | ICD-10-CM

## 2024-01-28 DIAGNOSIS — R9402 Abnormal brain scan: Secondary | ICD-10-CM

## 2024-01-28 DIAGNOSIS — Z7902 Long term (current) use of antithrombotics/antiplatelets: Secondary | ICD-10-CM

## 2024-01-28 DIAGNOSIS — K760 Fatty (change of) liver, not elsewhere classified: Secondary | ICD-10-CM | POA: Diagnosis present

## 2024-01-28 DIAGNOSIS — Z79890 Hormone replacement therapy (postmenopausal): Secondary | ICD-10-CM

## 2024-01-28 DIAGNOSIS — S72009A Fracture of unspecified part of neck of unspecified femur, initial encounter for closed fracture: Principal | ICD-10-CM

## 2024-01-28 DIAGNOSIS — I129 Hypertensive chronic kidney disease with stage 1 through stage 4 chronic kidney disease, or unspecified chronic kidney disease: Secondary | ICD-10-CM | POA: Diagnosis present

## 2024-01-28 DIAGNOSIS — I493 Ventricular premature depolarization: Secondary | ICD-10-CM | POA: Diagnosis present

## 2024-01-28 DIAGNOSIS — I2489 Other forms of acute ischemic heart disease: Secondary | ICD-10-CM | POA: Diagnosis present

## 2024-01-28 DIAGNOSIS — I452 Bifascicular block: Secondary | ICD-10-CM | POA: Diagnosis present

## 2024-01-28 DIAGNOSIS — Z8673 Personal history of transient ischemic attack (TIA), and cerebral infarction without residual deficits: Secondary | ICD-10-CM

## 2024-01-28 DIAGNOSIS — F03A3 Unspecified dementia, mild, with mood disturbance: Secondary | ICD-10-CM | POA: Diagnosis present

## 2024-01-28 DIAGNOSIS — R29810 Facial weakness: Secondary | ICD-10-CM | POA: Diagnosis not present

## 2024-01-28 DIAGNOSIS — Y92830 Public park as the place of occurrence of the external cause: Secondary | ICD-10-CM

## 2024-01-28 DIAGNOSIS — I10 Essential (primary) hypertension: Secondary | ICD-10-CM

## 2024-01-28 DIAGNOSIS — I639 Cerebral infarction, unspecified: Secondary | ICD-10-CM | POA: Diagnosis not present

## 2024-01-28 DIAGNOSIS — N281 Cyst of kidney, acquired: Secondary | ICD-10-CM | POA: Diagnosis present

## 2024-01-28 DIAGNOSIS — M810 Age-related osteoporosis without current pathological fracture: Secondary | ICD-10-CM | POA: Diagnosis present

## 2024-01-28 DIAGNOSIS — I440 Atrioventricular block, first degree: Secondary | ICD-10-CM

## 2024-01-28 DIAGNOSIS — R188 Other ascites: Secondary | ICD-10-CM

## 2024-01-28 DIAGNOSIS — R29720 NIHSS score 20: Secondary | ICD-10-CM | POA: Diagnosis not present

## 2024-01-28 DIAGNOSIS — N183 Chronic kidney disease, stage 3 unspecified: Secondary | ICD-10-CM | POA: Diagnosis present

## 2024-01-28 DIAGNOSIS — M1712 Unilateral primary osteoarthritis, left knee: Secondary | ICD-10-CM

## 2024-01-28 DIAGNOSIS — K5730 Diverticulosis of large intestine without perforation or abscess without bleeding: Secondary | ICD-10-CM | POA: Diagnosis present

## 2024-01-28 DIAGNOSIS — R29723 NIHSS score 23: Secondary | ICD-10-CM | POA: Diagnosis not present

## 2024-01-28 DIAGNOSIS — S92515A Nondisplaced fracture of proximal phalanx of left lesser toe(s), initial encounter for closed fracture: Secondary | ICD-10-CM

## 2024-01-28 DIAGNOSIS — W19XXXA Unspecified fall, initial encounter: Secondary | ICD-10-CM

## 2024-01-28 DIAGNOSIS — I6521 Occlusion and stenosis of right carotid artery: Secondary | ICD-10-CM | POA: Diagnosis present

## 2024-01-28 DIAGNOSIS — E042 Nontoxic multinodular goiter: Secondary | ICD-10-CM | POA: Diagnosis present

## 2024-01-28 DIAGNOSIS — Z7982 Long term (current) use of aspirin: Secondary | ICD-10-CM

## 2024-01-28 DIAGNOSIS — M5030 Other cervical disc degeneration, unspecified cervical region: Secondary | ICD-10-CM

## 2024-01-28 DIAGNOSIS — G8194 Hemiplegia, unspecified affecting left nondominant side: Secondary | ICD-10-CM | POA: Diagnosis not present

## 2024-01-28 DIAGNOSIS — Z79899 Other long term (current) drug therapy: Secondary | ICD-10-CM

## 2024-01-28 DIAGNOSIS — S72002A Fracture of unspecified part of neck of left femur, initial encounter for closed fracture: Secondary | ICD-10-CM | POA: Diagnosis present

## 2024-01-28 DIAGNOSIS — R9431 Abnormal electrocardiogram [ECG] [EKG]: Secondary | ICD-10-CM

## 2024-01-28 DIAGNOSIS — R5082 Postprocedural fever: Secondary | ICD-10-CM | POA: Diagnosis not present

## 2024-01-28 LAB — CBC WITH DIFF
BASOPHIL #: <0.1 x10ˆ3/uL (ref <=0.20)
BASOPHIL %: 0.3 %
EOSINOPHIL #: <0.1 x10ˆ3/uL (ref <=0.50)
EOSINOPHIL %: 0.5 %
HCT: 46.5 % — ABNORMAL HIGH (ref 34.8–46.0)
HGB: 14.8 g/dL (ref 11.5–16.0)
IMMATURE GRANULOCYTE #: 0.15 x10ˆ3/uL — ABNORMAL HIGH (ref <0.10)
IMMATURE GRANULOCYTE %: 1.1 % — ABNORMAL HIGH (ref 0.0–1.0)
LYMPHOCYTE #: 1.11 x10ˆ3/uL (ref 1.00–4.80)
LYMPHOCYTE %: 8.3 %
MCH: 27.7 pg (ref 26.0–32.0)
MCHC: 31.8 g/dL (ref 31.0–35.5)
MCV: 86.9 fL (ref 78.0–100.0)
MONOCYTE #: 0.63 x10ˆ3/uL (ref 0.20–1.10)
MONOCYTE %: 4.7 %
MPV: 9.5 fL (ref 8.7–12.5)
NEUTROPHIL #: 11.32 x10ˆ3/uL — ABNORMAL HIGH (ref 1.50–7.70)
NEUTROPHIL %: 85.1 %
PLATELETS: 211 x10ˆ3/uL (ref 150–400)
RBC: 5.35 x10ˆ6/uL — ABNORMAL HIGH (ref 3.85–5.22)
RDW-CV: 14.2 % (ref 11.5–15.5)
WBC: 13.3 x10ˆ3/uL — ABNORMAL HIGH (ref 3.7–11.0)

## 2024-01-28 LAB — COMPREHENSIVE METABOLIC PANEL, NON-FASTING
ALBUMIN: 3.7 g/dL (ref 3.4–4.8)
ALKALINE PHOSPHATASE: 86 U/L (ref 55–145)
ALT (SGPT): 21 U/L (ref <31)
ANION GAP: 11 mmol/L (ref 4–13)
AST (SGOT): 27 U/L (ref 11–34)
BILIRUBIN TOTAL: 0.6 mg/dL (ref 0.3–1.3)
BUN/CREA RATIO: 13 (ref 6–22)
BUN: 12 mg/dL (ref 8–25)
CALCIUM: 9.9 mg/dL (ref 8.6–10.3)
CHLORIDE: 106 mmol/L (ref 96–111)
CO2 TOTAL: 27 mmol/L (ref 23–31)
CREATININE: 0.94 mg/dL (ref 0.60–1.05)
GLUCOSE: 106 mg/dL (ref 65–125)
POTASSIUM: 4.4 mmol/L (ref 3.5–5.1)
PROTEIN TOTAL: 7.2 g/dL (ref 6.0–8.0)
SODIUM: 144 mmol/L (ref 136–145)
eGFRcr - FEMALE: 59 mL/min/1.73mˆ2 — ABNORMAL LOW (ref >=60)

## 2024-01-28 LAB — ECG 12 LEAD
Atrial Rate: 82 {beats}/min
Calculated P Axis: 70 degrees
Calculated R Axis: -75 degrees
Calculated T Axis: 6 degrees
PR Interval: 254 ms
QRS Duration: 148 ms
QT Interval: 404 ms
QTC Calculation: 472 ms
Ventricular rate: 82 {beats}/min

## 2024-01-28 LAB — PT/INR
INR: 1.06 (ref 0.90–1.10)
PROTHROMBIN TIME: 11 s (ref 9.0–13.0)

## 2024-01-28 LAB — PTT (PARTIAL THROMBOPLASTIN TIME): APTT: 29.1 s (ref 23.0–32.0)

## 2024-01-28 MED ORDER — ENOXAPARIN 40 MG/0.4 ML SUBCUTANEOUS SYRINGE
40.0000 mg | INJECTION | Freq: Every day | SUBCUTANEOUS | Status: DC
  Filled 2024-01-28: qty 0.4

## 2024-01-28 MED ORDER — SODIUM CHLORIDE 0.9 % (FLUSH) INJECTION SYRINGE: 3.0000 mL | INJECTION | INTRAMUSCULAR | Status: DC | PRN

## 2024-01-28 MED ORDER — ACETAMINOPHEN 1,000 MG/100 ML (10 MG/ML) INTRAVENOUS SOLUTION
1000.0000 mg | INTRAVENOUS | Status: AC
  Administered 2024-01-28: 0 mg via INTRAVENOUS
  Administered 2024-01-28: 1000 mg via INTRAVENOUS
  Filled 2024-01-28: qty 100

## 2024-01-28 MED ORDER — SODIUM CHLORIDE 0.9 % INTRAVENOUS SOLUTION
2.0000 g | Freq: Once | INTRAVENOUS | Status: AC
  Administered 2024-01-29 (×2): 2 g via INTRAVENOUS
  Filled 2024-01-28 (×2): qty 14.71

## 2024-01-28 MED ORDER — TRANEXAMIC ACID 1,000 MG/100 ML(10 MG/ML)IN SOD CHLOR,ISO IV PIGGYBACK
1000.0000 mg | INJECTION | Freq: Once | INTRAVENOUS | Status: DC
  Filled 2024-01-28 (×3): qty 100

## 2024-01-28 MED ORDER — IOPAMIDOL 370 MG IODINE/ML (76 %) INTRAVENOUS SOLUTION
100.0000 mL | INTRAVENOUS | Status: AC
  Administered 2024-01-28: 100 mL via INTRAVENOUS

## 2024-01-28 MED ORDER — SODIUM CHLORIDE 0.9 % INTRAVENOUS SOLUTION: 2.0000 g | Freq: Once | INTRAVENOUS | Status: DC

## 2024-01-28 MED ORDER — TRANEXAMIC ACID 1,000 MG/100 ML(10 MG/ML)IN SOD CHLOR,ISO IV PIGGYBACK
1000.0000 mg | INJECTION | Freq: Once | INTRAVENOUS | Status: AC
  Administered 2024-01-29 (×3): 1000 mg via INTRAVENOUS
  Filled 2024-01-28 (×2): qty 100

## 2024-01-28 MED ORDER — ONDANSETRON HCL (PF) 4 MG/2 ML INJECTION SOLUTION: 4.0000 mg | Freq: Four times a day (QID) | INTRAMUSCULAR | Status: DC | PRN

## 2024-01-28 MED ORDER — SODIUM CHLORIDE 0.9 % (FLUSH) INJECTION SYRINGE
3.0000 mL | INJECTION | Freq: Three times a day (TID) | INTRAMUSCULAR | Status: DC
  Administered 2024-01-28: 0 mL
  Administered 2024-01-29: 3 mL
  Administered 2024-01-29 – 2024-01-30 (×2): 0 mL

## 2024-01-28 MED ORDER — ACETAMINOPHEN 325 MG TABLET
650.0000 mg | ORAL_TABLET | ORAL | Status: DC | PRN
  Administered 2024-01-29: 650 mg via ORAL
  Filled 2024-01-28: qty 2

## 2024-01-28 MED ORDER — MORPHINE 2 MG/ML INTRAVENOUS SYRINGE
2.0000 mg | INJECTION | INTRAVENOUS | Status: AC
  Administered 2024-01-28: 2 mg via INTRAVENOUS
  Filled 2024-01-28: qty 1

## 2024-01-28 NOTE — ED Provider Notes (Signed)
 [name] - Emergency Department  ED Primary Provider Note      CHIEF COMPLAINT: Fall (1 hour ago, left leg gave out, pt fell, did not hit head, no loc.  Has pain in left femur, foot and knee, declines tylenol )    HPI:  Bridget [name] is a 89 y.o. female with past medical history of hypertension, mild dementia who presents after a fall.  Patient can not remember what happened.  [place]  [place] and she was going to go to the bathroom.  She thinks she may have tripped and fell.  She was reporting left hip and femur pain.  She does not think she hit her head.  She is not complaining of pain elsewhere.  No chest pain or shortness of breath.      HISTORY REVIEWED DURING THIS ENCOUNTER:   Medical History  Surgical History  Social History  Allergies    PHYSICAL EXAM:  ED Triage Vitals [01/28/24 1714]   BP (Non-Invasive) (!) 209/77   Heart Rate 88   Respiratory Rate 20   Temperature 36.7 C (98.1 F)   SpO2 98 %   Weight 78.5 kg (173 lb)   Height 1.6 m (5' 3)       General:  89 y.o. female who appears comfortable.   HENT: Atraumatic. Moist mucous membranes.    Eyes: EOMI. No scleral icterus.  Neck: Supple without nuchal rigidity.  No midline pain  Lungs: Breathing comfortably.  Symmetric expansion.  Clear to auscultation from an anterior position  Cardiovascular: Regular rate and rhythm.  Warm, well-perfused  Abdominal: Soft.  Non-distended.  No tenderness throughout.   Extremities/Skin:  No midline back pain.  No pain with ranging of the bilateral upper or right lower extremity.  She reports pain in the left hip and her leg is externally rotated and foreshortened.  No significant pain with palpation of the knee.  Neurovascularly intact distally.  Neurological: Patient alert and responsive.  No facial droop.  Moving all extremities equally      MEDICAL DECISION MAKING:    Patient presents with left hip pain after a fall.    X-rays has been placed before I saw  the patient.  I reviewed the x-ray of the femur and it does appear that she has a hip fracture.  Pelvis x-ray was added as well as noncontrast trauma scan.  She was ordered for IV Tylenol  for pain.    While she was lying in bed her O2 sats decreased to 89% with a good waveform.  She is not reporting any respiratory complaints.  She was placed on 2 L of supplemental oxygen.    X-rays:  Femoral neck fracture, possible 2nd phalanx fracture on the  Trauma pan scan:  Negative for acute findings  Labs with a leukocytosis of 13,000  EKG shows normal sinus rhythm without acute ischemic changes, right bundle branch block, no significant change from prior    She still complained of pain after Tylenol .  Repeat blood pressure at 21:26was 164/105.  She was ordered for a dose of 2 mg of morphine .    I discussed this case with Dr. Nivia of Orthopedics.  I also discuss this case with the admitting service.  Patient was admitted for ongoing care.    Medications Ordered/Administered in the ED   acetaminophen  (OFIRMEV ) 1,000 mg (10 mg/mL) IV 100 mL (tot vol) (0 mg Intravenous Stopped 01/28/24 1919)  iopamidol  (ISOVUE -370) 76% solution (100 mL Intravenous Given 01/28/24 2050)       Current Discharge Medication List             CLINICAL IMPRESSION:  Clinical Impression   Hip fracture (Primary)   Toe fracture         DISPOSITION: Admitted          EXTERNAL DATA REVIEWED BY MYSELF:  These include available notes from most recent ED visits, notes from most recent admissions, notes from most recent outpatient PCP or specialist visits, most recent laboratory studies, and most recent radiology studies/procedures.    ED EKG INTERPRETED BY MYSELF:    ED DIAGNOSTICS REVIEWED BY MYSELF:  Patient Data       Labs Ordered/Reviewed   COMPREHENSIVE METABOLIC PANEL, NON-FASTING - Abnormal; Notable for the following components:       Result Value    eGFRcr - FEMALE 59 (*)     All other components within normal limits   CBC WITH DIFF - Abnormal; Notable for  the following components:    WBC 13.3 (*)     RBC 5.35 (*)     HCT 46.5 (*)     NEUTROPHIL # 11.32 (*)     IMMATURE GRANULOCYTE % 1.1 (*)     IMMATURE GRANULOCYTE # 0.15 (*)     All other components within normal limits   PT/INR - Normal    Narrative:     In the setting of warfarin therapy, a moderate-intensity INR goal range is 2.0 to 3.0 and a high-intensity INR goal range is 2.5 to 3.5.    INR is ONLY validated to determine the level of anticoagulation with vitamin K antagonists (warfarin). Other factors may elevate the INR including but not limited to direct oral anticoagulants (DOACs), liver dysfunction, vitamin K deficiency, DIC, factor deficiencies, and factor inhibitors.   PTT (PARTIAL THROMBOPLASTIN TIME) - Normal   CBC/DIFF    Narrative:     The following orders were created for panel order CBC/DIFF.  Procedure                               Abnormality         Status                     ---------                               -----------         ------                     CBC WITH IPQQ[[id number]]                Abnormal            Final result                 Please view results for these tests on the individual orders.   EXTRA TUBES    Narrative:     The following orders were created for panel order EXTRA TUBES.  Procedure                               Abnormality         Status                     ---------                               -----------         ------  RED TOP ULAZ[[id number]]                                     In process                   Please view results for these tests on the individual orders.   RED TOP TUBE       CT BRAIN WO IV CONTRAST   Final Result by Edi, Radresults In (01/02 2113)   Chronic changes without acute intracranial abnormality.                  Radiologist location ID: [id number]         CT CERVICAL SPINE WO IV CONTRAST   Final Result by Edi, Radresults In (01/02 2119)   No acute fracture or traumatic subluxation.            Radiologist location ID:  [id number]         CT CHEST ABDOMEN PELVIS W IV CONTRAST   Final Result by Edi, Radresults In (01/02 2114)      Mildly impacted subcapital left femoral neck fracture.      No other intrathoracic or intra-abdominal traumatic injuries are seen.      Hepatic steatosis.      Renal cyst, Bosniak 1/Bosniak 2 cyst.      There is a small amount of fluid seen in the endometrial cavity, recommend nonemergent pelvic ultrasound.      Scattered thyroid  nodules, if further workup is indicated outpatient ultrasound of the thyroid  can be performed.      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                                 Radiologist location ID: [id number]         XR HIP LEFT W PELVIS 2-3 VIEWS   Final Result by Edi, Radresults In (01/02 1954)   Displaced left femoral neck fracture.            Radiologist location ID: [id number]         XR CHEST AP   Final Result by Edi, Radresults In (01/02 1830)   Clear lungs, no cardiomegaly and the lung volumes are normal.            Radiologist location ID: [id number]         XR FEMUR LEFT   Final Result by Edi, Radresults In (01/02 1851)   Displaced left femoral neck fracture.            Radiologist location ID: [id number]         XR FOOT LEFT 2 VIEW   Final Result by Edi, Radresults In (01/02 1856)   Suspect nondisplaced fracture at the distal second proximal phalanx, extending to the PIP joint.            Radiologist location ID: [id number]         XR KNEE LEFT 4 OR MORE VIEWS   Final Result by Edi, Radresults In (01/02 1853)   Degenerative changes of the knee without acute bony abnormality.            Radiologist location ID: [id number]

## 2024-01-28 NOTE — ED Nurses Note (Signed)
 Report given to [facility] LPN

## 2024-01-28 NOTE — Care Plan (Signed)
 Problem: Adult Inpatient Plan of Care  Goal: Absence of Hospital-Acquired Illness or Injury  Outcome: Ongoing (see interventions/notes)  Intervention: Identify and Manage Fall Risk  Recent Flowsheet Documentation  Taken 01/28/2024 2255 by Armida NOVAK, LPN  Safety Promotion/Fall Prevention:   safety round/check completed   nonskid shoes/slippers when out of bed  Intervention: Prevent Skin Injury  Recent Flowsheet Documentation  Taken 01/28/2024 2300 by Armida NOVAK, LPN  Skin Protection: adhesive use limited  Taken 01/28/2024 2255 by Armida NOVAK, LPN  Body Position: supine, head elevated  Skin Protection: adhesive use limited  Intervention: Prevent Infection  Recent Flowsheet Documentation  Taken 01/28/2024 2255 by Armida NOVAK, LPN  Infection Prevention: promote handwashing  Goal: Optimal Comfort and Wellbeing  Outcome: Ongoing (see interventions/notes)  Intervention: Provide Person-Centered Care  Recent Flowsheet Documentation  Taken 01/28/2024 2255 by Armida NOVAK, LPN  Trust Relationship/Rapport:   care explained   questions answered  Goal: Rounds/Family Conference  Outcome: Ongoing (see interventions/notes)     Problem: Skin Injury Risk Increased  Goal: Skin Health and Integrity  Outcome: Ongoing (see interventions/notes)  Intervention: Optimize Skin Protection  Recent Flowsheet Documentation  Taken 01/28/2024 2300 by Armida NOVAK, LPN  Pressure Reduction Techniques: Frequent weight shifting encouraged  Pressure Reduction Devices: Repositioning wedges/pillows utilized  Skin Protection: adhesive use limited  Taken 01/28/2024 2255 by Armida NOVAK, LPN  Pressure Reduction Techniques: Frequent weight shifting encouraged  Pressure Reduction Devices: Repositioning wedges/pillows utilized  Skin Protection: adhesive use limited  Head of Bed (HOB) Positioning: HOB elevated     Problem: Health Knowledge, Opportunity to Enhance (Adult,Obstetrics,Pediatric)  Goal: Knowledgeable about Health Subject/Topic  Description: Patient will demonstrate the desired outcomes by  discharge/transition of care.  Outcome: Ongoing (see interventions/notes)  Intervention: Enhance Health Knowledge  Recent Flowsheet Documentation  Taken 01/28/2024 2255 by Armida NOVAK, LPN  Family/Support System Care:   support provided   involvement promoted       Armida Pin, LPN

## 2024-01-29 ENCOUNTER — Encounter (HOSPITAL_COMMUNITY): Payer: Self-pay

## 2024-01-29 ENCOUNTER — Encounter (HOSPITAL_COMMUNITY): Admission: EM | Disposition: A | Payer: Self-pay | Source: Home / Self Care | Attending: HOSPITALIST-INTERNAL MEDICINE

## 2024-01-29 ENCOUNTER — Inpatient Hospital Stay (HOSPITAL_COMMUNITY)

## 2024-01-29 ENCOUNTER — Inpatient Hospital Stay (HOSPITAL_COMMUNITY): Admitting: Anesthesiology

## 2024-01-29 DIAGNOSIS — Z96642 Presence of left artificial hip joint: Secondary | ICD-10-CM

## 2024-01-29 DIAGNOSIS — Z471 Aftercare following joint replacement surgery: Secondary | ICD-10-CM

## 2024-01-29 DIAGNOSIS — S92912A Unspecified fracture of left toe(s), initial encounter for closed fracture: Secondary | ICD-10-CM

## 2024-01-29 DIAGNOSIS — M1612 Unilateral primary osteoarthritis, left hip: Secondary | ICD-10-CM

## 2024-01-29 DIAGNOSIS — S72002A Fracture of unspecified part of neck of left femur, initial encounter for closed fracture: Secondary | ICD-10-CM

## 2024-01-29 LAB — CBC WITH DIFF
BASOPHIL #: <0.1 x10ˆ3/uL (ref <=0.20)
BASOPHIL %: 0.3 %
EOSINOPHIL #: 0.22 x10ˆ3/uL (ref <=0.50)
EOSINOPHIL %: 1.6 %
HCT: 43 % (ref 34.8–46.0)
HGB: 13.9 g/dL (ref 11.5–16.0)
IMMATURE GRANULOCYTE #: <0.1 x10ˆ3/uL (ref <0.10)
IMMATURE GRANULOCYTE %: 0.4 % (ref 0.0–1.0)
LYMPHOCYTE #: 0.64 x10ˆ3/uL — ABNORMAL LOW (ref 1.00–4.80)
LYMPHOCYTE %: 4.8 %
MCH: 27.4 pg (ref 26.0–32.0)
MCHC: 32.3 g/dL (ref 31.0–35.5)
MCV: 84.6 fL (ref 78.0–100.0)
MONOCYTE #: 0.7 x10ˆ3/uL (ref 0.20–1.10)
MONOCYTE %: 5.2 %
MPV: 9.7 fL (ref 8.7–12.5)
NEUTROPHIL #: 11.74 x10ˆ3/uL — ABNORMAL HIGH (ref 1.50–7.70)
NEUTROPHIL %: 87.7 %
PLATELETS: 189 x10ˆ3/uL (ref 150–400)
RBC: 5.08 x10ˆ6/uL (ref 3.85–5.22)
RDW-CV: 14.3 % (ref 11.5–15.5)
WBC: 13.4 x10ˆ3/uL — ABNORMAL HIGH (ref 3.7–11.0)

## 2024-01-29 LAB — BASIC METABOLIC PANEL
ANION GAP: 7 mmol/L (ref 4–13)
BUN/CREA RATIO: 14 (ref 6–22)
BUN: 12 mg/dL (ref 8–25)
CALCIUM: 9 mg/dL (ref 8.6–10.3)
CHLORIDE: 105 mmol/L (ref 96–111)
CO2 TOTAL: 29 mmol/L (ref 23–31)
CREATININE: 0.86 mg/dL (ref 0.60–1.05)
GLUCOSE: 131 mg/dL — ABNORMAL HIGH (ref 65–125)
POTASSIUM: 4.1 mmol/L (ref 3.5–5.1)
SODIUM: 141 mmol/L (ref 136–145)
eGFRcr - FEMALE: 65 mL/min/1.73mˆ2 (ref >=60)

## 2024-01-29 LAB — MAGNESIUM: MAGNESIUM: 1.9 mg/dL (ref 1.8–2.6)

## 2024-01-29 MED ORDER — HYDROMORPHONE 0.5 MG/0.5 ML INJECTION SYRINGE
0.5000 mg | INJECTION | INTRAMUSCULAR | Status: DC | PRN
  Administered 2024-01-29 (×2): 0.5 mg via INTRAVENOUS
  Filled 2024-01-29 (×2): qty 0.5

## 2024-01-29 MED ORDER — HYDROCHLOROTHIAZIDE 25 MG TABLET
12.5000 mg | ORAL_TABLET | Freq: Every day | ORAL | Status: DC
  Administered 2024-01-29: 0 mg via ORAL
  Filled 2024-01-29 (×2): qty 0.5

## 2024-01-29 MED ORDER — OXYCODONE 5 MG TABLET
5.0000 mg | ORAL_TABLET | ORAL | Status: DC | PRN
  Administered 2024-01-29: 5 mg via ORAL
  Filled 2024-01-29: qty 1

## 2024-01-29 MED ORDER — PROPOFOL 10 MG/ML IV BOLUS
INJECTION | Freq: Once | INTRAVENOUS | Status: DC | PRN
  Administered 2024-01-29: 100 mg via INTRAVENOUS

## 2024-01-29 MED ORDER — AMLODIPINE 5 MG TABLET
5.0000 mg | ORAL_TABLET | Freq: Every day | ORAL | Status: DC
  Administered 2024-01-29: 0 mg via ORAL
  Filled 2024-01-29 (×2): qty 1

## 2024-01-29 MED ORDER — CALCIUM CHLORIDE 100 MG/ML (10 %) INTRAVENOUS SYRINGE
INJECTION | Freq: Once | INTRAVENOUS | Status: DC | PRN
  Administered 2024-01-29: 500 mg via INTRAVENOUS

## 2024-01-29 MED ORDER — FENTANYL (PF) 50 MCG/ML INJECTION SOLUTION
Freq: Once | INTRAMUSCULAR | Status: DC | PRN
  Administered 2024-01-29 (×2): 25 ug via INTRAVENOUS
  Administered 2024-01-29: 75 ug via INTRAVENOUS
  Administered 2024-01-29 (×2): 50 ug via INTRAVENOUS

## 2024-01-29 MED ORDER — LACTATED RINGERS INTRAVENOUS SOLUTION: INTRAVENOUS | Status: DC | PRN

## 2024-01-29 MED ORDER — EPHEDRINE (PF) 50 MG/5 ML (10 MG/ML) IN 0.9% SOD. CHLORIDE IV SYRINGE
INJECTION | Freq: Once | INTRAVENOUS | Status: DC | PRN
  Administered 2024-01-29 (×4): 10 mg via INTRAVENOUS

## 2024-01-29 MED ORDER — ROSUVASTATIN 20 MG TABLET
20.0000 mg | ORAL_TABLET | Freq: Every evening | ORAL | Status: DC
  Administered 2024-01-29: 20 mg via ORAL
  Filled 2024-01-29 (×2): qty 1

## 2024-01-29 MED ORDER — ENOXAPARIN 40 MG/0.4 ML SUBCUTANEOUS SYRINGE
40.0000 mg | INJECTION | Freq: Every day | SUBCUTANEOUS | Status: DC
  Filled 2024-01-29: qty 0.4

## 2024-01-29 MED ORDER — TRAZODONE 50 MG TABLET: 25.0000 mg | ORAL_TABLET | ORAL | Status: DC

## 2024-01-29 MED ORDER — DONEPEZIL 5 MG TABLET
5.0000 mg | ORAL_TABLET | Freq: Every evening | ORAL | Status: DC
  Administered 2024-01-29: 5 mg via ORAL
  Filled 2024-01-29 (×2): qty 1

## 2024-01-29 MED ORDER — MORPHINE 2 MG/ML INTRAVENOUS SYRINGE: 2.0000 mg | INJECTION | INTRAVENOUS | Status: DC | PRN

## 2024-01-29 MED ORDER — LACTATED RINGERS INTRAVENOUS SOLUTION
INTRAVENOUS | Status: DC
  Administered 2024-01-29: 0 via INTRAVENOUS
  Administered 2024-01-30: 0 mL via INTRAVENOUS

## 2024-01-29 MED ORDER — LISINOPRIL 20 MG-HYDROCHLOROTHIAZIDE 12.5 MG TABLET: 1.0000 | ORAL_TABLET | Freq: Every day | ORAL | Status: DC

## 2024-01-29 MED ORDER — TRAZODONE 50 MG TABLET
25.0000 mg | ORAL_TABLET | ORAL | Status: AC
  Administered 2024-01-29: 25 mg via ORAL
  Filled 2024-01-29: qty 0.5

## 2024-01-29 MED ORDER — LIDOCAINE HCL 20 MG/ML (2 %) INJECTION SOLUTION
Freq: Once | INTRAMUSCULAR | Status: DC | PRN
  Administered 2024-01-29: 40 mg via INTRAVENOUS

## 2024-01-29 MED ORDER — CLOPIDOGREL 75 MG TABLET
75.0000 mg | ORAL_TABLET | Freq: Every day | ORAL | Status: DC
  Filled 2024-01-29: qty 1

## 2024-01-29 MED ORDER — ONDANSETRON HCL (PF) 4 MG/2 ML INJECTION SOLUTION
Freq: Once | INTRAMUSCULAR | Status: DC | PRN
  Administered 2024-01-29: 8 mg via INTRAVENOUS

## 2024-01-29 MED ORDER — SODIUM CHLORIDE 0.9 % INTRAVENOUS SOLUTION
1.0000 g | Freq: Three times a day (TID) | INTRAVENOUS | Status: DC
  Administered 2024-01-29: 0 g via INTRAVENOUS
  Administered 2024-01-29 – 2024-01-30 (×2): 1 g via INTRAVENOUS
  Administered 2024-01-30: 0 g via INTRAVENOUS
  Filled 2024-01-29 (×3): qty 10

## 2024-01-29 MED ORDER — LISINOPRIL 20 MG TABLET
20.0000 mg | ORAL_TABLET | Freq: Every day | ORAL | Status: DC
  Administered 2024-01-29: 0 mg via ORAL
  Filled 2024-01-29 (×2): qty 1

## 2024-01-29 MED ORDER — ROCURONIUM 10 MG/ML INTRAVENOUS SOLUTION
Freq: Once | INTRAVENOUS | Status: DC | PRN
  Administered 2024-01-29 (×2): 20 mg via INTRAVENOUS
  Administered 2024-01-29: 30 mg via INTRAVENOUS
  Administered 2024-01-29: 50 mg via INTRAVENOUS

## 2024-01-29 MED ORDER — SODIUM CHLORIDE 0.9 % IRRIGATION SOLUTION
Freq: Once | Status: DC | PRN
  Administered 2024-01-29: 3000 mL

## 2024-01-29 MED ORDER — PHENYLEPHRINE 0.8 MG/10 ML (80 MCG/ML) IN 0.9 %SOD.CHLORIDE IV SYRINGE
INJECTION | Freq: Once | INTRAVENOUS | Status: DC | PRN
  Administered 2024-01-29 (×5): 80 ug via INTRAVENOUS

## 2024-01-29 MED ORDER — LEVOTHYROXINE 100 MCG TABLET
100.0000 ug | ORAL_TABLET | Freq: Every morning | ORAL | Status: DC
  Administered 2024-01-29 – 2024-01-30 (×2): 0 ug via ORAL
  Filled 2024-01-29 (×3): qty 1

## 2024-01-29 MED ORDER — SUGAMMADEX 100 MG/ML INTRAVENOUS SOLUTION
Freq: Once | INTRAVENOUS | Status: DC | PRN
  Administered 2024-01-29: 200 mg via INTRAVENOUS

## 2024-01-29 NOTE — Anesthesia Postprocedure Evaluation (Signed)
 Anesthesia Post Op Evaluation    Patient: Bridget [name]  Procedure(s):  LEFT ARTHROPLASTY HIP HEMI/BIPOLAR    Last Vitals:Temperature: 36.9 C (98.4 F) (01/29/24 1535)  Heart Rate: 95 (01/29/24 1535)  BP (Non-Invasive): (!) 147/61 (01/29/24 1535)  Respiratory Rate: 18 (01/29/24 1535)  SpO2: 92 % (01/29/24 1535)    No notable events documented.    Patient is sufficiently recovered from the effects of anesthesia to participate in the evaluation and has returned to their pre-procedure level.  Patient location during evaluation: PACU       Patient participation: complete - patient participated  Level of consciousness: awake and alert and responsive to verbal stimuli    Pain score: 1  Pain management: adequate  Airway patency: patent    Anesthetic complications: no  Cardiovascular status: acceptable  Respiratory status: acceptable  Hydration status: acceptable  Patient post-procedure temperature: Pt Normothermic   PONV Status: Absent

## 2024-01-29 NOTE — H&P (Signed)
 [facility]  Internal Medicine  Admission H&P    Date of Service:  01/29/2024  Bridget [name], Bridget [name], 89 y.o. female  Date of Admission:  01/28/2024  Date of Birth:  09-Apr-1934  PCP: Deward Squibb, MD    Chief Complaint:  Fall with left hip pain    History of Present Illness:   This is an 89 year old female with a past medical history significant for hypertension, hyperlipidemia, CVA, who presents to the emergency department secondary to left hip pain after falling.  Patient states she was at [employer] and when she went to get out of the car she slipped and fell and subsequently was unable to get up.  She denies hitting her head.  She denies loss of consciousness.  She complained of left foot and knee pain in ED however denied this on exam.  She denies chest pain, shortness for breath, dizziness, lightheadedness, nausea, vomiting, diarrhea, urinary symptoms, fever, chills, and abdominal pain.  She is alert and oriented and able to answer all questions appropriately.    Blood work performed in ED revealed a WBC of 13.3, with remaining labs unremarkable.  Chest x-ray negative for acute cardiopulmonary process.  Left femur x-ray notes displaced left femoral neck fracture.  Left foot x-ray notes suspected nondisplaced fracture of the distal 2nd proximal phalanx, extending to the PIP joint.  Left hip x-ray notes displaced left femoral neck fracture.  Left knee x-ray notes degenerative changes without acute bony abnormality.  CT brain and CT cervical spine negative for acute process.  CT chest/abdomen/pelvis notes mildly impacted subcapital left femoral neck fracture.  Additionally there is a small amount of fluid seen in the endometrial cavity, recommend nonemergent pelvic ultrasound by radiologist.  Scattered thyroid  nodules noted as well with recommended outpatient ultrasound of the thyroid  if indicated.  EKG revealed sinus rhythm with first-degree AV block with occasional PVCs.  Right bundle branch block.  Left  anterior fascicular block.  Orthopedic was consulted in ED and seen patient at bedside.    History:    Past Medical:    Past Medical History:   Diagnosis Date    Arthritis     HTN (hypertension)       Past Surgical:    Past Surgical History:   Procedure Laterality Date    KNEE SURGERY        Family:    Family Medical History:       Problem Relation (Age of Onset)    No Known Problems Mother, Father, Sister, Brother, Maternal Grandmother, Maternal Grandfather, Paternal Grandmother, Paternal Grandfather, Daughter, Son, Maternal Aunt, Maternal Uncle, Paternal Aunt, Paternal Uncle, Other           Social:   reports that she has never smoked. She has never used smokeless tobacco. She reports that she does not drink alcohol and does not use drugs.     Allergies[1]    Medications:  Medications Prior to Admission       Prescriptions    amLODIPine  (NORVASC ) 5 mg Oral Tablet    TAKE 1 TABLET BY MOUTH EVERY DAY. STOP TAKING THE AMLODIPINE  2.5MG     clopidogreL  (PLAVIX ) 75 mg Oral Tablet    Take 1 Tablet (75 mg total) by mouth Once a day for 30 days    donepeziL  (ARICEPT ) 5 mg Oral Tablet    Take 1 Tablet (5 mg total) by mouth Every night    levothyroxine  (SYNTHROID ) 100 mcg Oral Tablet    TAKE 1 TABLET BY MOUTH  FIRST THING IN THE MORNING ONE HOUR BEFORE BREAKFAST ON AN EMPTY STOMACH    lisinopriL -hydrochlorothiazide  (ZESTORETIC ) 20-12.5 mg Oral Tablet    Take 1 Tablet by mouth Daily    MULTI-VITAMIN ORAL    Take by mouth    risedronate (ACTONEL) 150 mg Oral Tablet    Take 1 Tablet (150 mg total) by mouth Per Instructions PLEASE SEE ATTACHED FOR DETAILED DIRECTIONS    rosuvastatin  (CRESTOR ) 20 mg Oral Tablet    Take 1 Tablet (20 mg total) by mouth Every evening for 30 days          acetaminophen  (TYLENOL ) tablet, 650 mg, Oral, Q4H PRN  enoxaparin  PF (LOVENOX ) 40 mg/0.4 mL SubQ injection, 40 mg, Subcutaneous, Daily  morphine  2 mg/mL injection, 2 mg, Intravenous, Q4H PRN  NS flush syringe, 3 mL, Intracatheter, Q8HRS  NS flush  syringe, 3 mL, Intracatheter, Q1H PRN  ondansetron  (ZOFRAN ) 2 mg/mL injection, 4 mg, Intravenous, Q6H PRN  tranexamic acid  (CYKLOKAPRON ) 1000 mg in 0.7% sodium chloride  100 mL premix IVPB, 1,000 mg, Intravenous, Once          REVIEW OF SYSTEMS:  ROS negative unless otherwise specified in HPI.    Examination:  Temperature: 37.1 C (98.8 F) Heart Rate: 97 BP (Non-Invasive): (!) 149/75   Respiratory Rate: 16 SpO2: 94 %       Exam:  Nursing note and vitals reviewed.   General: No acute distress, alert and oriented x3  HEENT: PERRLA, conjunctivae are without injection or scleral icterus  Cardio: Regular rate and rhythm.   Resp: Clear to auscultation bilaterally. No wheezes, rales, rhonchi or crackles. Normal resp effort.  Abd: Bowel sounds present in all 4 quadrants. No rebound tenderness or involuntary guarding. Liver and spleen not palpable.  GU: Deferred exam  Extremities:  Slight shortening and external rotation of left lower extremity  Skin: No rashes, ulcers, or bruising.  Neuro: No focal deficits.     Lines, Drains, and Airways:  Patient Lines/Drains/Airways Status       Active Line / Dialysis Catheter / Dialysis Graft / Drain / Airway / Wound       Name Placement date Placement time Site Days    Peripheral IV Anterior;Right 01/28/24  1900  -- less than 1    Foley Catheter 01/28/24  1921  -- less than 1                       Labs:     Results for orders placed or performed during the hospital encounter of 01/28/24 (from the past 24 hours)   ECG 12 LEAD   Result Value Ref Range    Ventricular rate 82 BPM    Atrial Rate 82 BPM    PR Interval 254 ms    QRS Duration 148 ms    QT Interval 404 ms    QTC Calculation 472 ms    Calculated P Axis 70 degrees    Calculated R Axis -75 degrees    Calculated T Axis 6 degrees   COMPREHENSIVE METABOLIC PANEL, NON-FASTING   Result Value Ref Range    SODIUM 144 136 - 145 mmol/L    POTASSIUM 4.4 3.5 - 5.1 mmol/L    CHLORIDE 106 96 - 111 mmol/L    CO2 TOTAL 27 23 - 31 mmol/L    ANION  GAP 11 4 - 13 mmol/L    BUN 12 8 - 25 mg/dL    CREATININE 9.05 9.39 - 1.05  mg/dL    BUN/CREA RATIO 13 6 - 22    eGFRcr - FEMALE 59 (L) >=60 mL/min/1.100m2    ALBUMIN 3.7 3.4 - 4.8 g/dL    CALCIUM  9.9 8.6 - 10.3 mg/dL    GLUCOSE 893 65 - 874 mg/dL    ALKALINE PHOSPHATASE 86 55 - 145 U/L    ALT (SGPT) 21 <31 U/L    AST (SGOT)  27 11 - 34 U/L    BILIRUBIN TOTAL 0.6 0.3 - 1.3 mg/dL    PROTEIN TOTAL 7.2 6.0 - 8.0 g/dL   PT/INR   Result Value Ref Range    PROTHROMBIN TIME 11.0 9.0 - 13.0 seconds    INR 1.06 0.90 - 1.10   PTT (PARTIAL THROMBOPLASTIN TIME)   Result Value Ref Range    APTT 29.1 23.0 - 32.0 seconds   CBC WITH DIFF   Result Value Ref Range    WBC 13.3 (H) 3.7 - 11.0 x103/uL    RBC 5.35 (H) 3.85 - 5.22 x106/uL    HGB 14.8 11.5 - 16.0 g/dL    HCT 53.4 (H) 65.1 - 46.0 %    MCV 86.9 78.0 - 100.0 fL    MCH 27.7 26.0 - 32.0 pg    MCHC 31.8 31.0 - 35.5 g/dL    RDW-CV 85.7 88.4 - 84.4 %    PLATELETS 211 150 - 400 x103/uL    MPV 9.5 8.7 - 12.5 fL    NEUTROPHIL % 85.1 %    LYMPHOCYTE % 8.3 %    MONOCYTE % 4.7 %    EOSINOPHIL % 0.5 %    BASOPHIL % 0.3 %    NEUTROPHIL # 11.32 (H) 1.50 - 7.70 x103/uL    LYMPHOCYTE # 1.11 1.00 - 4.80 x103/uL    MONOCYTE # 0.63 0.20 - 1.10 x103/uL    EOSINOPHIL # <0.10 <=0.50 x103/uL    BASOPHIL # <0.10 <=0.20 x103/uL    IMMATURE GRANULOCYTE % 1.1 (H) 0.0 - 1.0 %    IMMATURE GRANULOCYTE # 0.15 (H) <0.10 x103/uL      CBC:     13.3* (01/02 1859) \   14.8 (01/02 1859) /   211 (01/02 1859)      / 46.5* (01/02 1859) \           BMP:   144 (01/02 1859) 106 (01/02 1859) 12 (01/02 1859)    /         4.4 (01/02 1859) 27 (01/02 1859) 0.94 (01/02 1859) \                Imaging Studies:    Results for orders placed or performed during the hospital encounter of 01/28/24 (from the past 24 hours)   XR KNEE LEFT 4 OR MORE VIEWS     Status: None    Narrative    Jaylenn MAE Mette  Female, 89 years old.    XR KNEE LEFT 4 OR MORE VIEWS performed on 01/28/2024 6:21 PM.    REASON FOR EXAM:   fall    TECHNIQUE: 4 views/4 images submitted for interpretation.    COMPARISON: Radiograph of the femur performed the same day.    FINDINGS:  There is grossly preserved alignment of the left knee. Tricompartmental degenerative arthrosis is present, with joint space narrowing greatest in the medial tibiofemoral compartment. No definite fracture is identified. Sclerotic density in the distal femur is likely a chronic finding. A prominent osteophyte is seen arising from the superior patella. There  is no joint effusion or significant overlying soft tissue swelling.      Impression    Degenerative changes of the knee without acute bony abnormality.        Radiologist location ID: [id number]     XR FOOT LEFT 2 VIEW     Status: None    Narrative    Karris MAE Kirchoff  Female, 89 years old.    XR FOOT LEFT 2 VIEWS performed on 01/28/2024 6:22 PM.    REASON FOR EXAM:  fall    TECHNIQUE: 2 views/2 images submitted for interpretation.    COMPARISON: Radiographs of the left lower extremity performed the same day.    FINDINGS:  Tarsal alignment appears to be grossly preserved within the limits of this nonweightbearing exam. Degenerative changes are seen throughout. There is hallux valgus with corresponding moderate degenerative arthrosis of the first MTP joint. Irregular lucency is seen traversing the distal second proximal phalanx, involving the PIP joint, concerning for fracture. No additional fracture is identified. A calcaneal bone spur is identified.      Impression    Suspect nondisplaced fracture at the distal second proximal phalanx, extending to the PIP joint.        Radiologist location ID: [id number]     XR FEMUR LEFT     Status: None    Narrative    Doloris MAE Iannone  Female, 89 years old.    XR FEMUR LEFT- 2 VIEWS performed on 01/28/2024 6:22 PM.    REASON FOR EXAM:  fall    TECHNIQUE: 2 views/5 images submitted for interpretation.    COMPARISON: CT urogram performed 08/14/2018.    FINDINGS:  There is a displaced  fracture of the left femoral neck. The femoral head appears to remain seated within the left acetabulum. Adjacent surgical clips project over the left inferior pubic ramus. No additional fracture is identified in the femur. Prominent degenerative changes are partially seen at the level of the knee.      Impression    Displaced left femoral neck fracture.        Radiologist location ID: [id number]     XR CHEST AP     Status: None    Narrative    Helaine MAE Dittus  Female, 89 years old.    XR CHEST AP performed on 01/28/2024 6:23 PM.    REASON FOR EXAM:  lung eval, fall     TECHNIQUE: 1 views/1 images submitted for interpretation.    COMPARISON: 10/22/2022    FINDINGS:    Cardiac silhouette and mediastinal structures are unremarkable and stable in appearance. The lungs are clear and expanded with no consolidations and no effusions. Unremarkable bowel gas pattern. Degenerative change in the spine.      Impression    Clear lungs, no cardiomegaly and the lung volumes are normal.        Radiologist location ID: [id number]     XR HIP LEFT W PELVIS 2-3 VIEWS     Status: None    Narrative    Anndrea MAE Blumstein  Female, 89 years old.    XR HIP LEFT W PELVIS 2-3 VIEWS performed on 01/28/2024 7:16 PM.    REASON FOR EXAM:  fall, hip pain, assess fx     TECHNIQUE: 2 views/3 images submitted for interpretation.    COMPARISON: Radiograph of the femur performed the same day, CT urogram performed 08/04/2018.    FINDINGS:  There is an acute displaced fracture of the left femoral neck, with  slight superior and lateral subluxation of the proximal femur. Additional fracture of the greater trochanter cannot be excluded. The femoral head remains seated in the acetabulum with moderate degenerative arthrosis. There is also moderate degenerative arthrosis of the right hip. There is no widening of the pubic symphysis or sacroiliac joints, both of which demonstrate degenerative change. There is limited evaluation of the sacrum due to overlying  bowel gas. Surgical clips project over the bilateral inferior pubic rami. Degenerative changes are also seen in the spine.      Impression    Displaced left femoral neck fracture.        Radiologist location ID: [id number]     CT BRAIN WO IV CONTRAST     Status: None    Narrative    Rubi MAE Willits  Female, 89 years old.    CT BRAIN WO IV CONTRAST performed on 01/28/2024 8:49 PM    INDICATION: 89 years old Female fall    TECHNIQUE: CT of the head acquired without contrast administration    RADIATION DOSE: 2010.20 mGy.cm    COMPARISON: CT head and neck October 23, 2022    FINDINGS:     Brain: Periventricular lucency could relate to chronic microvascular ischemic disease. No acute territorial infarction or intraparenchymal hemorrhage. No mass effect. No midline shift.    Ventricles/extra-axial spaces: No hydrocephalus. No extra-axial hemorrhage or fluid collection.    Other findings: None.        Impression    Chronic changes without acute intracranial abnormality.            Radiologist location ID: [id number]     CT CERVICAL SPINE WO IV CONTRAST     Status: None    Narrative    Kathia MAE Nez  Female, 89 years old.    CT CERVICAL SPINE WO IV CONTRAST performed on 01/28/2024 8:49 PM    INDICATION: 89 years old Female fall    TECHNIQUE: CT of the cervical spine acquired without contrast.    RADIATION DOSE: 2010.20 mGy.cm    COMPARISON: CTA head and neck October 23, 2022    FINDINGS:   Bones are demineralized limiting evaluation for acute fracture.  Craniocervical junction: No acute fracture or dissociation.    Subaxial alignment and vertebrae: No distraction or translation injury. Vertebral bodies and posterior elements are intact.    Discs, spinal canal, and neural foramina: Multilevel degenerative disc changes.    Perivertebral/neck soft tissues: No acute abnormality.    Other findings including visualized chest: Left inferior thyroid  nodule measuring 1.5 cm.        Impression    No acute fracture or  traumatic subluxation.        Radiologist location ID: [id number]     CT CHEST ABDOMEN PELVIS W IV CONTRAST     Status: None    Narrative    Bonny MAE Sennett  Female, 89 years old.    CT CHEST ABDOMEN PELVIS W IV CONTRAST performed on 01/28/2024 8:49 PM.    REASON FOR EXAM:  fall, fall    RADIATION DOSE: 2010.20 mGy.cm    CONTRAST: 100 ml's of Isovue  370    TECHNIQUE: Contiguous axially collimated images were obtained from the lung apices through the proximal femurs. Postprocessing of the original data set was performed.  Coronal reformatted images were prepared on a separate workstation and reviewed for anatomic correlation. This CT exam was performed using one or more of the following dose reduction techniques: Automated exposure control, adjustment of  the mA and/or kV according to patient size, or use of iterative reconstruction technique.    COMPARISON: 08/04/2018    FINDINGS:    CHEST:    HEART/MEDIASTINUM: Heart is normal in size. No pericardial effusion. No pulmonary embolus, the main pulmonary artery is normal in size. Moderate atherosclerotic calcifications throughout.    LYMPH NODES/ESOPHAGUS: The esophagus is normal and there is no lymphadenopathy. No axillary lymphadenopathy.    LUNGS/PLEURA: Lungs are clear without consolidation. No pulmonary nodules or masses.    SOFT TISSUE/BONES: The thyroid  is heterogeneous with scattered nodules, recommend nonemergent thyroid  ultrasound is an outpatient.    ABDOMEN/PELVIS:    VASCULATURE: Moderate atherosclerotic disease throughout the IVC is normal.    LIVER: The liver is fatty infiltrated, normal in size measuring 15 cm cranial caudal dimension. The portal and hepatic veins are patent.    BILIARY SYSTEM/GALLBLADDER: The gallbladder is mildly distended with no calcified stones, no secondary signs of acute cholecystitis. No intra or extrahepatic biliary dilatation.    SPLEEN: The spleen is normal in size measuring 12.5 cm with no splenic lesions. The splenic vein  is patent.    PANCREAS: Pancreas is normal with no ductal dilatation.    ADRENALS: The right adrenal is normal and there is mild symmetric thickening in the left adrenal with no discrete mass.    KIDNEY/URETERS/URINARY BLADDER: Bilateral renal cysts consistent with Bosniak 1/Bosniak 2 cyst. There is no hydronephrosis or hydroureter and there is no calcified stones. There is a Foley catheter in a decompressed urinary bladder with a small amount of air in the bladder.    REPRODUCTIVE ORGANS: The uterus is atrophied with low density in the area of the endometrial stripe. No adnexal abnormalities.    GI/BOWEL: The distal esophagus, stomach, duodenum, small bowel loops are normal. The terminal ileum and appendix are normal. There is a moderate stool load in the colon with fairly severe diverticular disease in the sigmoid colon, no secondary signs of acute diverticulitis.    PERITONEAL CAVITY/LYMPH NODES:  No retroperitoneal lymphadenopathy, no free fluid or free air in the abdomen or pelvis.    SOFT TISSUES/BONES: No soft tissue abnormalities. There is a left mildly impacted subcapital femoral neck fracture. There is degenerative change in the spine and there is facet joint arthropathy and moderate degenerative disc disease throughout the spine. Degenerative change in the visualized cervical spine.      Impression    Mildly impacted subcapital left femoral neck fracture.    No other intrathoracic or intra-abdominal traumatic injuries are seen.    Hepatic steatosis.    Renal cyst, Bosniak 1/Bosniak 2 cyst.    There is a small amount of fluid seen in the endometrial cavity, recommend nonemergent pelvic ultrasound.    Scattered thyroid  nodules, if further workup is indicated outpatient ultrasound of the thyroid  can be performed.    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                      Radiologist location ID: [id number]                Assessment/Plan:   Active  Hospital Problems    Diagnosis    Primary Problem: Closed left hip fracture     Left femoral neck fracture  - Patient was seen by Orthopedics in ED  - NPO after midnight  - Cefazolin  ordered preop  - Morphine  p.r.n. pain  - EKG and chest  x-ray obtained for medical clearance.  EKG revealed sinus rhythm with first-degree AV block with occasional PVCs, right bundle branch block, left anterior fascicular block, possible lateral infarct age indeterminate.  [place] Bring perioperative risk low    Small amount of fluid seen in the endometrial cavity  -noted on CT scan  -ultrasound ordered for a.m.    Thyroid  nodules  -noted on CT scan  -last ultrasound 10/2022 with recommended yearly ultrasounds  -non emergent outpatient ultrasound    History of CVA  - Hold prescribed Plavix  for anticipated surgical procedure    Hypertension  - Continue prescribed amlodipine  and Zestoretic     Hypothyroidism  - Continue prescribed levothyroxine     Mild cognitive impairment  -continue prescribed Aricept           Portions of this note may be dictated using voice recognition software or a dictation service. Variances in spelling and vocabulary are possible and unintentional. Not all errors may be caught/corrected. Please notify the dino if any discrepancies are noted or if the meaning of any statement is unclear.         DVT/GI Prophylaxis:  Lovenox  subQ    CODE Status: Full code    Disposition Planning:  Pending clinical course  Alan Ran, APRN, CNP               [1] No Known Allergies

## 2024-01-29 NOTE — Brief Op Note (Signed)
 [facility]                                                    BRIEF OPERATIVE NOTE    Patient Name: Bridget [name] [facility] Number: [id number]  Date of Service: 01/29/2024  Date of Birth: 10-18-1934    Pre-Operative Diagnosis: Left femoral neck fracture   Post-Operative Diagnosis: Same  Procedure(s)/Description:  Left hip hemiarthroplasty  Findings: displaced femoral neck fracture    Attending Surgeon: Zacharee Gaddie, MD   Assistant(s): None    Tourniquet Time: None  Anesthesia Type: General   Estimated Blood Loss:  300 ml  Blood Given: None  Complications:  None    Drains: None  Specimens/ Cultures: None  Implants: Avenir cemented 2 stem, +0 neck, 44 mm bipolar shell           Disposition: PACU - hemodynamically stable.  Condition: stable      Koreena Joost, MD

## 2024-01-29 NOTE — OR Surgeon (Signed)
 [facility]                                         Orthopedic operative note    Patient Name: Bridget [name] [facility]  Age:  89 y.o.  Sex:  female  MRN:  [id number]  CSN:  [id number]  Date of Service: 01/29/2024  Date of Birth: Aug 29, 1934    Pre-Operative Diagnosis: left femoral neck fracture   Post-Operative Diagnosis: left femoral neck fracture  Procedure(s)/Description:  Procedure(s):  LEFT ARTHROPLASTY HIP HEMI/BIPOLAR       Findings: displaced left femoral neck fracture     Attending Surgeon: Stefano Trulson, MD  Assistant(s):  None    Anesthesia Type: General  Estimated Blood Loss:  300 mL  Complications (not routinely expected or not inherent to difficulty/nature of procedure): None  Specimens/ Cultures: None    Antibiotics:  ancef  perioperative           Disposition: PACU  Condition: Stable    Operative Report     Indications:  89 y.o. female with displaced left femoral neck fracture.  The diagnosis and proposed surgery (left hip hemiarthroplasty) was discussed with the patient.  The expected perioperative events were discussed with the patient.  The risks, benefits, and alternatives to surgery were discussed.  The risks of surgery include bleeding, infection, blood clot, neurovascular damage, wound complications, muscle weakness, limp, hardware complications, leg length discrepancy, dislocation, heart attack, stroke, and even death.  The patient seems to understand the risks of surgery and wishes to proceed.  Full informed consent was obtained.  No guarantee of outcome was stated or implied.  She had a chance to have all her questions answered     Technique:   Written informed consent was obtained.  The correct patient and procedural site/side were identified and marked after verbal verification as well as on the consent form.  The patient was taken back to the operating room.  Anesthesia induced by the Anesthesia service.  Patient placed lateral decubitus position with the operative side up on the operating  table.  All bony prominences were well padded.  Axillary roll was placed.  The hip was prepped and draped in usual sterile fashion.  Incision was marked over the lateral hip, centered over the greater trochanter.  A preop surgical time-out was performed, and all were in agreement.  Antibiotics were given less than 1 hour prior to incision.  Tranexamic acid  given.     Incision was made through the skin sharply.  Electrocautery was used to dissect through the subcutaneous tissue.  The fascia was encountered.  The fascia was cleared of subcutaneous tissue.  An incision was made through the fascia in line with the skin incision.  The musculature was bluntly split.  A Charnley retractor was used.  The greater trochanter was cleared of bursa.  The abductor musculature had the appearance of chronic tearing, especially anteriorly.  The gluteus medius musculature was split 1/3 to 2/3 in conjunction with the already torn gluteus medius.  The anterior soft tissue was released from the bone.  The capsule was entered.  The fracture was identified.  A saw was used to osteotomize the neck cut, approximately 1 fingerbreadth with above lesser trochanter.  The femoral head fracture was removed.  The femoral head was removed.  It was measured to be 44 mm.  Trial head sizes were performed, and the appropriate  size was chosen.     The proximal femur was exposed.  A box osteotome was used.  A canal finder was used.  Sequential broaches were used to expose the proximal femur in the appropriate version, in line with the neck which was mostly neutral, slightly anteverted version.  Size 2 was stable to rotation.  Trial neck and head sizes were used.  Trialing included noting the amount of force required for reduction, distal shuck, lateral shuck, leg length comparison, flexion adduction internal rotation for posterior dislocation or impingement, extension adduction external rotation for anterior dislocation or impingement.  Neck size -3 or  +0 felt appropriate.      The trial components were removed and the canal was prepped for cementation.  The canal was irrigated and dried.  The distal plug was placed.  The cement was mixed.  After appropriate cementation time, cement was applied with thumb pressure into the canal.  The final stem implant was placed in the same version.  The laser line did sit on the medial osteotomy.  Excess cement was removed.  After the cement cured, +0 was trialed and was felt to be appropriate.  The final head was implanted.  It was trialed and felt to be appropriate, especially after capsule and abductor repair.     The wound was copiously irrigated.  The capsule and abductor split was repaired using #1 Vicryl in an interrupted figure 8 fashion.  The fascia was closed using #1 Vicryl in an interrupted figure 8 fashion.  The deep dermal layer was closed with 2-0 Vicryl.  The skin was closed with staples.  A sterile gray island dressing was applied.  The patient was awakened from anesthesia and taken to the recovery room in stable condition with a bed pillow between the knees.    Plan  -Admitted to hospitalist service  -Diet: ok for diet from orthopaedic standpoint  -Activity: weight bearing as tolerated left lower extremity, bed pillow in between legs, anterolateral hip precautions  -Dressings: keep dressing clean, dry, intact  -PT/OT after surgery  -Pain control: per hospitalist  -Ice/Elevate as needed  -decubitus precautions  -Antibiotics: perioperative  -DVT prophylaxis: per primary team (recommend Lovenox )   -Disposition: pending    Loa Camp, MD      Note: A portion of this documentation was generated using MMODAL (voice recognition software) and may contain syntax/voice recognition errors.

## 2024-01-29 NOTE — Care Plan (Signed)
 Problem: Adult Inpatient Plan of Care  Goal: Absence of Hospital-Acquired Illness or Injury  Outcome: Ongoing (see interventions/notes)  Intervention: Identify and Manage Fall Risk  Recent Flowsheet Documentation  Taken 01/29/2024 0720 by Olla Delancey W, LPN  Safety Promotion/Fall Prevention:   activity supervised   fall prevention program maintained   motion sensor pad activated   safety round/check completed  Intervention: Prevent Skin Injury  Recent Flowsheet Documentation  Taken 01/29/2024 0720 by Perel Hauschild W, LPN  Skin Protection:   adhesive use limited   incontinence pads utilized  Intervention: Prevent Infection  Recent Flowsheet Documentation  Taken 01/29/2024 0720 by Taniela Feltus W, LPN  Infection Prevention:   rest/sleep promoted   promote handwashing  Goal: Optimal Comfort and Wellbeing  Outcome: Ongoing (see interventions/notes)  Goal: Rounds/Family Conference  Outcome: Ongoing (see interventions/notes)     Problem: Skin Injury Risk Increased  Goal: Skin Health and Integrity  Outcome: Ongoing (see interventions/notes)  Intervention: Optimize Skin Protection  Recent Flowsheet Documentation  Taken 01/29/2024 0720 by Sheldon Sem W, LPN  Pressure Reduction Techniques: Frequent weight shifting encouraged  Pressure Reduction Devices: Repositioning wedges/pillows utilized  Skin Protection:   adhesive use limited   incontinence pads utilized     Problem: Health Knowledge, Opportunity to Enhance (Adult,Obstetrics,Pediatric)  Goal: Knowledgeable about Health Subject/Topic  Description: Patient will demonstrate the desired outcomes by discharge/transition of care.  Outcome: Ongoing (see interventions/notes)     Problem: Wound  Goal: Optimal Coping  Outcome: Ongoing (see interventions/notes)  Goal: Optimal Functional Ability  Outcome: Ongoing (see interventions/notes)  Goal: Absence of Infection Signs and Symptoms  Outcome: Ongoing (see interventions/notes)  Goal: Improved Oral Intake  Outcome: Ongoing (see interventions/notes)  Goal:  Optimal Pain Control and Function  Outcome: Ongoing (see interventions/notes)  Goal: Skin Health and Integrity  Outcome: Ongoing (see interventions/notes)  Intervention: Optimize Skin Protection  Recent Flowsheet Documentation  Taken 01/29/2024 0720 by Cearra Portnoy W, LPN  Pressure Reduction Techniques: Frequent weight shifting encouraged  Pressure Reduction Devices: Repositioning wedges/pillows utilized  Goal: Optimal Wound Healing  Outcome: Ongoing (see interventions/notes)  Intervention: Promote Wound Healing  Recent Flowsheet Documentation  Taken 01/29/2024 0720 by Kailena Lubas W, LPN  Pressure Reduction Techniques: Frequent weight shifting encouraged  Pressure Reduction Devices: Repositioning wedges/pillows utilized

## 2024-01-29 NOTE — Nurses Notes (Signed)
 The patient has ripped her cardiac monitor off multiple times. I have educated the patient over and over but she is pleasantly confused with dementia and when I walk out of the room she is ripping it off again.  Dr. Dominique notified. Tele tech notified.     Armida Pin, LPN

## 2024-01-29 NOTE — Consults (Signed)
 [place] [facility]  [address]  [place] [address]    DEPARTMENT OF ORTHOPAEDICS        Ortho History & Physical / Consult Note      Patient:  Bridget [name]  Date of Birth: 12-Dec-1934    MRN: [id number]     Acct: [id number]    PCP: Deward Squibb, MD    Date of Admission: 01/28/2024    Date of Service: Pt seen/examined on 01/29/2024     Chief Complaint: Left hip pain    History Of Present Illness: 89 y.o. female who presents with left hip pain after a slip and fall.  Emergency Department radiographs demonstrate displaced left femoral neck fracture.  No fevers, chills, chest pain, shortness of breath, nausea, vomiting, lightheadedness, dizziness, numbness, tingling      Patient ambulation status: no assistive device .   Antiplatelets/Anticoagulation includes: Plavix .    Past Medical History:      Past Medical History:   Diagnosis Date    Arthritis     HTN (hypertension)             Past Surgical History:      Past Surgical History:   Procedure Laterality Date    KNEE SURGERY              Home Medications:   Prior to Admission medications   Medication Sig Start Date End Date Taking? Authorizing Provider   amLODIPine  (NORVASC ) 5 mg Oral Tablet TAKE 1 TABLET BY MOUTH EVERY DAY. STOP TAKING THE AMLODIPINE  2.5MG  06/09/20  Yes Provider, Historical   clopidogreL  (PLAVIX ) 75 mg Oral Tablet Take 1 Tablet (75 mg total) by mouth Once a day for 30 days 10/25/22 01/28/24 Yes Singh, Zorawar, MD   donepeziL  (ARICEPT ) 5 mg Oral Tablet Take 1 Tablet (5 mg total) by mouth Every night   Yes Provider, Historical   levothyroxine  (SYNTHROID ) 100 mcg Oral Tablet TAKE 1 TABLET BY MOUTH FIRST THING IN THE MORNING ONE HOUR BEFORE BREAKFAST ON AN EMPTY STOMACH 06/12/20  Yes Provider, Historical   lisinopriL -hydrochlorothiazide  (ZESTORETIC ) 20-12.5 mg Oral Tablet Take 1 Tablet by mouth Daily   Yes Provider, Historical   MULTI-VITAMIN ORAL Take by mouth   Yes Provider, Historical   risedronate (ACTONEL) 150 mg Oral Tablet  Take 1 Tablet (150 mg total) by mouth Per Instructions PLEASE SEE ATTACHED FOR DETAILED DIRECTIONS   Yes Provider, Historical   rosuvastatin  (CRESTOR ) 20 mg Oral Tablet Take 1 Tablet (20 mg total) by mouth Every evening for 30 days 10/24/22 01/28/24 Yes Singh, Zorawar, MD          Current Hospital Medications:    Current Medications[1]       Allergies:  Patient has no known allergies.    Social History:      Social History     Socioeconomic History    Marital status: Married     Spouse name: Not on file    Number of children: Not on file    Years of education: Not on file    Highest education level: Not on file   Occupational History    Not on file   Tobacco Use    Smoking status: Never    Smokeless tobacco: Never   Vaping Use    Vaping status: Never Used   Substance and Sexual Activity    Alcohol use: Never    Drug use: Never    Sexual activity: Not on file   Other Topics  Concern    Not on file   Social History Narrative    Not on file     Social Determinants of Health     Financial Resource Strain: Low Risk (01/28/2024)    Financial Resource Strain     SDOH Financial: No   Transportation Needs: Low Risk (01/28/2024)    Transportation Needs     SDOH Transportation: No   Social Connections: Low Risk (01/28/2024)    Social Connections     SDOH Social Isolation: 5 or more times a week   Intimate Partner Violence: Not on file   Housing Stability: Low Risk (01/28/2024)    Housing Stability     SDOH Housing Situation: I have housing.     SDOH Housing Worry: No       Family History:      Family Medical History:       Problem Relation (Age of Onset)    No Known Problems Mother, Father, Sister, Brother, Maternal Grandmother, Maternal Grandfather, Paternal Grandmother, Paternal Grandfather, Daughter, Son, Maternal Aunt, Maternal Uncle, Paternal Aunt, Paternal Uncle, Other              Further Family History is noncontributory to this injury.      REVIEW OF SYSTEMS:    Per HPI      PHYSICAL EXAM:    BP (!) 169/73   Pulse 93   Temp  37.4 C (99.3 F)   Resp 18   Ht 1.6 m (5' 3)   Wt 72.3 kg (159 lb 6.3 oz)   SpO2 95%   BMI 28.24 kg/m         GENERAL APPEARANCE: No acute distress, except appropriate to injury.   MOOD AND AFFECT: Normal mood/ affect  GAIT AND STATION: Patient is in bed and unable to ambulate secondary to known injury.  LUNGS: Breathing nonlabored  INTEGUMENT: skin is generally warm and dry    Left Lower Extremity:  Hip range of motion not performed due to known injury  Slight knee tenderness, no effusion  No calf tenderness bilaterally  Sensation intact over distal foot  Distally warm and well perfused  PF/DF motor function remain grossly intact.       Labs:     Recent Labs     01/28/24  1859 01/29/24  0706   WBC 13.3* 13.4*   HGB 14.8 13.9   HCT 46.5* 43.0     Recent Labs     01/28/24  1859 01/29/24  0706   CO2 27 29   BUN 12 12   CREATININE 0.94 0.86   CALCIUM  9.9 9.0     Recent Labs     01/28/24  1859   INR 1.06     No results for input(s): SEDRATE, CRP in the last 72 hours.  No results for input(s): HCG in the last 72 hours.     The above labs were reviewed by me.      Radiology:     XR: displaced left femoral neck fracture     Radiology report reviewed.  FINDINGS:  There is an acute displaced fracture of the left femoral neck, with slight superior and lateral subluxation of the proximal femur. Additional fracture of the greater trochanter cannot be excluded. The femoral head remains seated in the acetabulum with moderate degenerative arthrosis. There is also moderate degenerative arthrosis of the right hip. There is no widening of the pubic symphysis or sacroiliac joints, both of which demonstrate degenerative change. There is  limited evaluation of the sacrum due to overlying bowel gas. Surgical clips project over the bilateral inferior pubic rami. Degenerative changes are also seen in the spine.     IMPRESSION:  Displaced left femoral neck fracture.      ASSESSMENT:    89 y.o. female with displaced left femoral  neck fracture.  The diagnosis and proposed surgery (left hip hemiarthroplasty) was discussed with the patient.  The expected perioperative events were discussed with the patient.  The risks, benefits, and alternatives to surgery were discussed.  The risks of surgery include bleeding, infection, blood clot, neurovascular damage, wound complications, muscle weakness, limp, hardware complications, leg length discrepancy, dislocation, heart attack, stroke, and even death.  The patient seems to understand the risks of surgery and wishes to proceed.  Full informed consent was obtained.  No guarantee of outcome was stated or implied.  She had a chance to have all her questions answered    PLAN:    -Admitted to hospitalist service  -Plan for OR today  -Diet: NPO at Midnight  -Activity: weight bearing as tolerated left lower extremity after surgery is expected  -PT/OT after surgery  -Pain control: per hospitalist  -Ice/Elevate as needed  -decubitus precautiosn  -Antibiotics: perioperative  -DVT prophylaxis: hold pharmacological prophylaxis for surgery  -Disposition: pending             [1]   Current Facility-Administered Medications:     acetaminophen  (TYLENOL ) tablet, 650 mg, Oral, Q4H PRN, Adrien Palma, APRN, CNP, 650 mg at 01/29/24 0507    amLODIPine  (NORVASC ) tablet, 5 mg, Oral, Daily, Connors, Amanda, APRN, CNP    ceFAZolin  (ANCEF ) 2 g in NS 100 mL IVPB with adaptor, 2 g, Intravenous, PRE-OP Once, Janelli Welling, MD    [facility] by provider] clopidogrel  (PLAVIX ) 75 mg tablet, 75 mg, Oral, Daily, Connors, Amanda, APRN, CNP    donepezil  (ARICEPT ) tablet, 5 mg, Oral, NIGHTLY, Connors, Amanda, APRN, CNP    enoxaparin  PF (LOVENOX ) 40 mg/0.4 mL SubQ injection, 40 mg, Subcutaneous, Daily, Connors, Amanda, APRN, CNP    lisinopril  (PRINIVIL ) tablet, 20 mg, Oral, Daily **AND** hydroCHLOROthiazide  (HYDRODIURIL ) tablet, 12.5 mg, Oral, Daily, Connors, Amanda, APRN, CNP    levothyroxine  (SYNTHROID ) tablet, 100 mcg, Oral, QAM, Connors,  Amanda, APRN, CNP    morphine  2 mg/mL injection, 2 mg, Intravenous, Q4H PRN, Jain, Samir, MD    NS flush syringe, 3 mL, Intracatheter, Q8HRS, Connors, Amanda, APRN, CNP    NS flush syringe, 3 mL, Intracatheter, Q1H PRN, Adrien Palma, APRN, CNP    ondansetron  (ZOFRAN ) 2 mg/mL injection, 4 mg, Intravenous, Q6H PRN, Connors, Amanda, APRN, CNP    rosuvastatin  (CRESTOR ) tablet, 20 mg, Oral, QPM, Connors, Amanda, APRN, CNP    tranexamic acid  (CYKLOKAPRON ) 1000 mg in 0.7% sodium chloride  100 mL premix IVPB, 1,000 mg, Intravenous, PRE-OP Once, Breigh Annett, MD    tranexamic acid  (CYKLOKAPRON ) 1000 mg in 0.7% sodium chloride  100 mL premix IVPB, 1,000 mg, Intravenous, Once, Doreene Forrey, MD

## 2024-01-29 NOTE — Anesthesia Procedure Notes (Addendum)
 Bridget [name]    Airway Note  General Information and Staff   Authorizing provider: Corbett Debby SAUNDERS, MD  Performing provider: Curly Cough, APRN,CRNA        Reason: elective    Airway not difficult    Indications and Patient Condition  Pt location: In Or  Indications for airway management: anesthesia   Patient position: sniffing     Mask difficulty assessment: 2 - vent by mask + OA or adjuvant +/- NMBA        Final Airway Details    Final airway type: endotracheal airway         Successful intubation technique: direct laryngoscopy              Blade: Macintosh  Blade size: #3  Cormack-Lehane Classification: grade IIa - partial view of glottis  Placement verified by: chest auscultation and capnometry   Marked at 22  Measured from: lips  Secured with: Tape  Number of attempts at approach: 1Airway complications: Atraumatic

## 2024-01-29 NOTE — Progress Notes (Addendum)
 [place] [facility]  [address]  [place] [address]    DEPARTMENT OF ORTHOPAEDICS     Post-Operative Check    Patient: Bridget [name] [facility]  DOB: 1934/08/28  MRN: [id number]    Date: 01/29/2024    S: Patient is resting comfortably in no acute distress.  No pain.  No chest pain or shortness of breath    O:  Filed Vitals:    01/29/24 0635 01/29/24 0915 01/29/24 0917 01/29/24 1429   BP: (!) 169/73  (!) 157/67    Pulse: 82 93     Resp: 18 18     Temp: 37.3 C (99.1 F) 37.4 C (99.3 F)  36 C (96.8 F)   SpO2:  95%         Gen:   NAD, pain controlled   Dressings are c/d/i.  No knee effusion  Sequential compression devices in place bilaterlaly  No calf tenderness  Motor: + dorsiflexion, plantarflexion  Distally warm and well-perfused.  Palpable dorsalis pedis pulse    PACU XR: no acute hardware complication    A/P:  Left femoral neck fracture  Status pot left hip hemiarthroplasty 01/29/2024    -Admitted to hospitalist service  -Diet: ok for diet from orthopaedic standpoint  -Activity: weight bearing as tolerated left lower extremity, bed pillow in between legs, anterolateral hip precautions  -Dressings: keep dressing clean, dry, intact  -PT/OT after surgery  -Pain control: per hospitalist  -Ice/Elevate as needed  -decubitus precautions  -Antibiotics: perioperative  -DVT prophylaxis: per primary team (recommend Lovenox )   -Disposition: pending      Tahirih Lair, MD 01/29/2024, 15:07

## 2024-01-29 NOTE — Anesthesia Preprocedure Evaluation (Signed)
 ANESTHESIA PRE-OP EVALUATION  Planned Procedure: LEFT ARTHROPLASTY HIP HEMI/BIPOLAR (Left)  Review of Systems                   Pulmonary  negative pulmonary ROS,    Cardiovascular    Hypertension, ECG reviewed and hyperlipidemia ,No peripheral edema,        GI/Hepatic/Renal   negative GI/hepatic/renal ROS,         Endo/Other         Neuro/Psych/MS    CVA     Cancer                        Physical Assessment      Airway       Mallampati: II    TM distance: 3 FB    Neck ROM: full              Dental           (+) missing           Pulmonary    Breath sounds clear to auscultation  (-) no rhonchi, no decreased breath sounds, no wheezes, no rales and no stridor     Cardiovascular    Rhythm: regular  Rate: Normal  (-) no friction rub, carotid bruit is not present, no peripheral edema and no murmur     Other findings              Plan  ASA 3     Planned anesthesia type: general     general anesthesia with endotracheal tube intubation                    Intravenous induction       Anesthetic plan and risks discussed with patient           Patient's NPO status is appropriate for Anesthesia.

## 2024-01-29 NOTE — Nurses Notes (Signed)
 Report called to pre-op nurse. Patient to be transported via to pre-op bed 5 by patient transport.     Axten Pascucci, LPN

## 2024-01-29 NOTE — Pharmacy (Signed)
 [employer]  Medication History Changes  01/29/2024    Patient: Bridget [name], Bridget [name]    MRN: [id number]  Date of Birth:  [birth year]    The following medications were    Changed:  levothyroxine      Added:  aspirin , vitamin B12, sertraline      Removed:  SABRA    Information was collected from:  Caregiver      Ozell Schooling, [facility]

## 2024-01-30 ENCOUNTER — Telehealth (HOSPITAL_COMMUNITY): Payer: Self-pay | Admitting: NEUROLOGY

## 2024-01-30 ENCOUNTER — Encounter (HOSPITAL_COMMUNITY): Payer: Self-pay

## 2024-01-30 ENCOUNTER — Inpatient Hospital Stay (HOSPITAL_COMMUNITY): Admitting: Neurology

## 2024-01-30 ENCOUNTER — Inpatient Hospital Stay (HOSPITAL_COMMUNITY): Admitting: Student in an Organized Health Care Education/Training Program

## 2024-01-30 ENCOUNTER — Inpatient Hospital Stay
Admission: EM | Admit: 2024-01-30 | Discharge: 2024-02-27 | Disposition: E | Source: Ambulatory Visit | Attending: Neurology | Admitting: Neurology

## 2024-01-30 ENCOUNTER — Inpatient Hospital Stay (HOSPITAL_COMMUNITY)

## 2024-01-30 ENCOUNTER — Encounter (HOSPITAL_COMMUNITY): Admission: EM | Disposition: E | Payer: Self-pay | Source: Ambulatory Visit | Attending: Neurology

## 2024-01-30 DIAGNOSIS — F39 Unspecified mood [affective] disorder: Secondary | ICD-10-CM

## 2024-01-30 DIAGNOSIS — E785 Hyperlipidemia, unspecified: Secondary | ICD-10-CM

## 2024-01-30 DIAGNOSIS — I10 Essential (primary) hypertension: Secondary | ICD-10-CM

## 2024-01-30 DIAGNOSIS — Z4659 Encounter for fitting and adjustment of other gastrointestinal appliance and device: Secondary | ICD-10-CM

## 2024-01-30 DIAGNOSIS — Z515 Encounter for palliative care: Secondary | ICD-10-CM

## 2024-01-30 DIAGNOSIS — I6601 Occlusion and stenosis of right middle cerebral artery: Secondary | ICD-10-CM

## 2024-01-30 DIAGNOSIS — G8194 Hemiplegia, unspecified affecting left nondominant side: Secondary | ICD-10-CM

## 2024-01-30 DIAGNOSIS — I63511 Cerebral infarction due to unspecified occlusion or stenosis of right middle cerebral artery: Secondary | ICD-10-CM

## 2024-01-30 DIAGNOSIS — I129 Hypertensive chronic kidney disease with stage 1 through stage 4 chronic kidney disease, or unspecified chronic kidney disease: Secondary | ICD-10-CM

## 2024-01-30 DIAGNOSIS — J9601 Acute respiratory failure with hypoxia: Secondary | ICD-10-CM

## 2024-01-30 DIAGNOSIS — I639 Cerebral infarction, unspecified: Secondary | ICD-10-CM

## 2024-01-30 DIAGNOSIS — M810 Age-related osteoporosis without current pathological fracture: Secondary | ICD-10-CM

## 2024-01-30 DIAGNOSIS — I6521 Occlusion and stenosis of right carotid artery: Secondary | ICD-10-CM

## 2024-01-30 DIAGNOSIS — R29810 Facial weakness: Secondary | ICD-10-CM

## 2024-01-30 DIAGNOSIS — R269 Unspecified abnormalities of gait and mobility: Secondary | ICD-10-CM

## 2024-01-30 DIAGNOSIS — I6611 Occlusion and stenosis of right anterior cerebral artery: Secondary | ICD-10-CM

## 2024-01-30 DIAGNOSIS — Z96642 Presence of left artificial hip joint: Secondary | ICD-10-CM

## 2024-01-30 DIAGNOSIS — R5082 Postprocedural fever: Secondary | ICD-10-CM

## 2024-01-30 DIAGNOSIS — S92502A Displaced unspecified fracture of left lesser toe(s), initial encounter for closed fracture: Secondary | ICD-10-CM

## 2024-01-30 DIAGNOSIS — R29720 NIHSS score 20: Secondary | ICD-10-CM

## 2024-01-30 DIAGNOSIS — R918 Other nonspecific abnormal finding of lung field: Secondary | ICD-10-CM

## 2024-01-30 DIAGNOSIS — N183 Chronic kidney disease, stage 3 unspecified: Secondary | ICD-10-CM

## 2024-01-30 LAB — LIPID PANEL
CHOL/HDL RATIO: 2.3
CHOLESTEROL: 97 mg/dL — ABNORMAL LOW (ref 100–200)
HDL CHOL: 43 mg/dL — ABNORMAL LOW (ref >=50)
LDL CALC: 43 mg/dL (ref <100)
NON-HDL: 54 mg/dL (ref <=190)
TRIGLYCERIDES: 42 mg/dL (ref <150)
VLDL CALC: 6 mg/dL (ref <30)

## 2024-01-30 LAB — BASIC METABOLIC PANEL
ANION GAP: 6 mmol/L (ref 4–13)
ANION GAP: 8 mmol/L (ref 4–13)
BUN/CREA RATIO: 19 (ref 6–22)
BUN/CREA RATIO: 18 (ref 6–22)
BUN: 20 mg/dL (ref 8–25)
BUN: 17 mg/dL (ref 8–25)
CALCIUM: 9.2 mg/dL (ref 8.6–10.3)
CALCIUM: 7.7 mg/dL — ABNORMAL LOW (ref 8.6–10.3)
CHLORIDE: 106 mmol/L (ref 96–111)
CHLORIDE: 110 mmol/L (ref 96–111)
CO2 TOTAL: 25 mmol/L (ref 23–31)
CO2 TOTAL: 20 mmol/L — ABNORMAL LOW (ref 23–31)
CREATININE: 1.06 mg/dL — ABNORMAL HIGH (ref 0.60–1.05)
CREATININE: 0.93 mg/dL (ref 0.60–1.05)
GLUCOSE: 126 mg/dL — ABNORMAL HIGH (ref 65–125)
GLUCOSE: 136 mg/dL — ABNORMAL HIGH (ref 65–125)
POTASSIUM: 4.3 mmol/L (ref 3.5–5.1)
POTASSIUM: 3.9 mmol/L (ref 3.5–5.1)
SODIUM: 137 mmol/L (ref 136–145)
SODIUM: 138 mmol/L (ref 136–145)
eGFRcr - FEMALE: 51 mL/min/1.73mˆ2 — ABNORMAL LOW (ref >=60)
eGFRcr - FEMALE: 59 mL/min/1.73mˆ2 — ABNORMAL LOW (ref >=60)

## 2024-01-30 LAB — URINALYSIS, MACROSCOPIC
BILIRUBIN: NEGATIVE mg/dL
GLUCOSE: NEGATIVE mg/dL
KETONES: NEGATIVE mg/dL
LEUKOCYTES: NEGATIVE WBCs/uL
NITRITE: NEGATIVE
PH: 6 (ref 5.0–8.0)
PROTEIN: 30 mg/dL — AB
SPECIFIC GRAVITY: 1.033 — ABNORMAL HIGH (ref 1.005–1.030)
UROBILINOGEN: NEGATIVE mg/dL

## 2024-01-30 LAB — BLOOD GAS W/ CO-OX, LYTES, LACTATE REFLEX
%FIO2 (ARTERIAL): 32 %
BASE EXCESS (ARTERIAL): 3.3 mmol/L — ABNORMAL HIGH (ref 0.0–3.0)
BICARBONATE (ARTERIAL): 27.5 mmol/L (ref 21.0–28.0)
CARBOXYHEMOGLOBIN: 1.8 % (ref <=3.0)
CHLORIDE: 108 mmol/L — ABNORMAL HIGH (ref 98–107)
GLUCOSE: 142 mg/dL — ABNORMAL HIGH (ref 65–125)
HEMATOCRITRT: 37 % (ref 37–50)
HEMOGLOBIN: 12.3 g/dL (ref 12.0–18.0)
IONIZED CALCIUM: 1.3 mmol/L (ref 1.15–1.33)
LACTATE: 0.7 mmol/L (ref <=1.9)
MET-HEMOGLOBIN: 0.8 % (ref <=1.5)
O2 SATURATION (ARTERIAL): 96 % (ref 94.0–98.0)
O2CT: 16.5 %
OXYHEMOGLOBIN: 95.2 % — ABNORMAL HIGH (ref 90.0–95.0)
PAO2/FIO2 RATIO: 253
PCO2 (ARTERIAL): 45 mmHg (ref 35–45)
PH (ARTERIAL): 7.41 (ref 7.35–7.45)
PO2 (ARTERIAL): 81 mmHg — ABNORMAL LOW (ref 83–108)
SODIUM: 132 mmol/L — ABNORMAL LOW (ref 136–145)
WHOLE BLOOD POTASSIUM: 4.3 mmol/L (ref 3.5–5.1)

## 2024-01-30 LAB — CBC WITH DIFF
BASOPHIL #: <0.1 x10ˆ3/uL (ref <=0.20)
BASOPHIL %: 0.2 %
EOSINOPHIL #: <0.1 x10ˆ3/uL (ref <=0.50)
EOSINOPHIL %: 0.1 %
HCT: 36.3 % (ref 34.8–46.0)
HGB: 11.5 g/dL (ref 11.5–16.0)
IMMATURE GRANULOCYTE #: <0.1 x10ˆ3/uL (ref <0.10)
IMMATURE GRANULOCYTE %: 0.7 % (ref 0.0–1.0)
LYMPHOCYTE #: 0.82 x10ˆ3/uL — ABNORMAL LOW (ref 1.00–4.80)
LYMPHOCYTE %: 6.7 %
MCH: 27.7 pg (ref 26.0–32.0)
MCHC: 31.7 g/dL (ref 31.0–35.5)
MCV: 87.5 fL (ref 78.0–100.0)
MONOCYTE #: 1.12 x10ˆ3/uL — ABNORMAL HIGH (ref 0.20–1.10)
MONOCYTE %: 9.1 %
MPV: 10.4 fL (ref 8.7–12.5)
NEUTROPHIL #: 10.21 x10ˆ3/uL — ABNORMAL HIGH (ref 1.50–7.70)
NEUTROPHIL %: 83.2 %
PLATELETS: 171 x10ˆ3/uL (ref 150–400)
RBC: 4.15 x10ˆ6/uL (ref 3.85–5.22)
RDW-CV: 14.4 % (ref 11.5–15.5)
WBC: 12.3 x10ˆ3/uL — ABNORMAL HIGH (ref 3.7–11.0)

## 2024-01-30 LAB — POC BLOOD GLUCOSE (RESULTS)
GLUCOSE, POC: 116 mg/dL — ABNORMAL HIGH (ref 70–100)
GLUCOSE, POC: 142 mg/dL — ABNORMAL HIGH (ref 65–125)
GLUCOSE, POC: 108 mg/dL (ref 65–125)
GLUCOSE, POC: 108 mg/dL (ref 65–125)

## 2024-01-30 LAB — ECG 12 LEAD
Atrial Rate: 95 {beats}/min
Calculated P Axis: 44 degrees
Calculated R Axis: -68 degrees
Calculated T Axis: 9 degrees
PR Interval: 192 ms
QRS Duration: 146 ms
QT Interval: 360 ms
QTC Calculation: 452 ms
Ventricular rate: 95 {beats}/min

## 2024-01-30 LAB — URINALYSIS, MICROSCOPIC
HYALINE CASTS: 8 /LPF — ABNORMAL HIGH (ref <4.0)
RBCS: 25 /HPF — ABNORMAL HIGH (ref <6.0)
WBCS: 10 /HPF (ref <11.0)

## 2024-01-30 LAB — HGA1C (HEMOGLOBIN A1C WITH EST AVG GLUCOSE)
ESTIMATED AVERAGE GLUCOSE: 111 mg/dL
HEMOGLOBIN A1C: 5.5 % (ref 4.0–5.6)

## 2024-01-30 LAB — CBC
HCT: 28.2 % — ABNORMAL LOW (ref 34.8–46.0)
HGB: 9.2 g/dL — ABNORMAL LOW (ref 11.5–16.0)
MCH: 27.7 pg (ref 26.0–32.0)
MCHC: 32.6 g/dL (ref 31.0–35.5)
MCV: 84.9 fL (ref 78.0–100.0)
MPV: 10 fL (ref 8.7–12.5)
PLATELETS: 145 x10ˆ3/uL — ABNORMAL LOW (ref 150–400)
RBC: 3.32 x10ˆ6/uL — ABNORMAL LOW (ref 3.85–5.22)
RDW-CV: 14.5 % (ref 11.5–15.5)
WBC: 8.7 x10ˆ3/uL (ref 3.7–11.0)

## 2024-01-30 LAB — THYROID STIMULATING HORMONE (SENSITIVE TSH): TSH: 1.451 u[IU]/mL (ref 0.350–4.940)

## 2024-01-30 LAB — AST (SGOT): AST (SGOT): 28 U/L (ref 11–34)

## 2024-01-30 LAB — ALT (SGPT): ALT (SGPT): 9 U/L (ref <31)

## 2024-01-30 LAB — TROPONIN-I
TROPONIN-I HS: 120.6 ng/L (ref <=14.0)
TROPONIN-I HS: 193.2 ng/L — ABNORMAL HIGH (ref <=14.0)

## 2024-01-30 LAB — PHOSPHORUS: PHOSPHORUS: 2.9 mg/dL (ref 2.3–4.0)

## 2024-01-30 LAB — MAGNESIUM: MAGNESIUM: 1.6 mg/dL — ABNORMAL LOW (ref 1.8–2.6)

## 2024-01-30 MED ORDER — TIROFIBAN 250 MCG/ML CONCENTRATE,INTRAVENOUS
5.0000 ug/kg | Freq: Once | INTRAVENOUS | Status: AC
  Administered 2024-01-30: 362.5 ug via INTRAVENOUS

## 2024-01-30 MED ORDER — ELECTROLYTE-R (PH 7.4) INTRAVENOUS SOLUTION
INTRAVENOUS | Status: DC
  Administered 2024-02-01: 0 mL via INTRAVENOUS

## 2024-01-30 MED ORDER — POLYETHYLENE GLYCOL 3350 17 GRAM ORAL POWDER PACKET
17.0000 g | Freq: Every day | ORAL | Status: DC | PRN
  Filled 2024-01-30 (×2): qty 1

## 2024-01-30 MED ORDER — DONEPEZIL 5 MG TABLET
5.0000 mg | ORAL_TABLET | Freq: Every evening | ORAL | Status: DC
  Administered 2024-01-31: 5 mg via GASTROSTOMY
  Filled 2024-01-30 (×2): qty 1

## 2024-01-30 MED ORDER — VANCOMYCIN 1 GRAM/200 ML IN DEXTROSE 5 % INTRAVENOUS PIGGYBACK
15.0000 mg/kg | INJECTION | Freq: Once | INTRAVENOUS | Status: DC
  Filled 2024-01-30: qty 200

## 2024-01-30 MED ORDER — GADOPICLENOL 0.5 MMOL/ML INTRAVENOUS SOLUTION: 7.5000 mL | INTRAVENOUS | Status: AC

## 2024-01-30 MED ORDER — ACETAMINOPHEN 1,000 MG/100 ML (10 MG/ML) INTRAVENOUS SOLUTION: 1000.0000 mg | Freq: Four times a day (QID) | INTRAVENOUS | Status: DC | PRN

## 2024-01-30 MED ORDER — INSULIN LISPRO 100 UNIT/ML SUB-Q SSIP VIAL
2.0000 [IU] | INJECTION | Freq: Four times a day (QID) | SUBCUTANEOUS | Status: DC
  Administered 2024-01-31: 2 [IU] via SUBCUTANEOUS
  Administered 2024-01-31: 0 [IU] via SUBCUTANEOUS
  Administered 2024-01-31: 2 [IU] via SUBCUTANEOUS
  Administered 2024-01-31: 3 [IU] via SUBCUTANEOUS
  Administered 2024-02-01: 2 [IU] via SUBCUTANEOUS
  Filled 2024-01-30: qty 30
  Filled 2024-01-30 (×3): qty 20

## 2024-01-30 MED ORDER — CLOPIDOGREL 75 MG TABLET
75.0000 mg | ORAL_TABLET | Freq: Every day | ORAL | Status: DC
  Administered 2024-01-31: 75 mg via GASTROSTOMY
  Filled 2024-01-30: qty 1

## 2024-01-30 MED ORDER — HEPARIN (PORCINE) 1,000 UNIT/ML INJECTION SOLUTION
Freq: Once | INTRAMUSCULAR | Status: DC | PRN
  Administered 2024-01-30: 5000 [IU] via INTRAVENOUS

## 2024-01-30 MED ORDER — IOPAMIDOL 370 MG IODINE/ML (76 %) INTRAVENOUS SOLUTION
180.0000 mL | INTRAVENOUS | Status: AC
  Administered 2024-01-30: 180 mL via INTRAVENOUS

## 2024-01-30 MED ORDER — ROCURONIUM 10 MG/ML INTRAVENOUS SYRINGE WRAPPER
INJECTION | Freq: Once | INTRAVENOUS | Status: DC | PRN
  Administered 2024-01-30: 100 mg via INTRAVENOUS

## 2024-01-30 MED ORDER — GLUCAGON HCL 1 MG/ML SOLUTION FOR INJECTION: 1.0000 mg | Freq: Once | INTRAMUSCULAR | Status: DC | PRN

## 2024-01-30 MED ORDER — ELECTROLYTE-R (PH 7.4) INTRAVENOUS SOLUTION: INTRAVENOUS | Status: DC

## 2024-01-30 MED ORDER — MELATONIN 3 MG TABLET: 3.0000 mg | ORAL_TABLET | Freq: Every evening | ORAL | Status: DC | PRN

## 2024-01-30 MED ORDER — FENTANYL (PF) 50 MCG/ML INJECTION WRAPPER
INJECTION | Freq: Once | INTRAMUSCULAR | Status: DC | PRN
  Administered 2024-01-30: 100 ug via INTRAVENOUS

## 2024-01-30 MED ORDER — SODIUM CHLORIDE 0.9 % (FLUSH) INJECTION SYRINGE: 2.0000 mL | INJECTION | INTRAMUSCULAR | Status: DC | PRN

## 2024-01-30 MED ORDER — CLOPIDOGREL 75 MG TABLET
150.0000 mg | ORAL_TABLET | Freq: Once | ORAL | Status: AC
  Filled 2024-01-30: qty 2

## 2024-01-30 MED ORDER — PHENYLEPHRINE 1 MG/10 ML (100 MCG/ML) IN 0.9 % SOD.CHLORIDE IV SYRINGE
INJECTION | Freq: Once | INTRAVENOUS | Status: DC | PRN
  Administered 2024-01-30: 50 ug via INTRAVENOUS

## 2024-01-30 MED ORDER — DONEPEZIL 5 MG TABLET: 5.0000 mg | ORAL_TABLET | Freq: Every evening | ORAL | Status: DC

## 2024-01-30 MED ORDER — ELECTROLYTE-R (PH 7.4) BOLUS: 1000.0000 mL | Freq: Once | INTRAVENOUS | Status: AC

## 2024-01-30 MED ORDER — NOREPINEPHRINE 16MG IN NS 250ML INFUSION - FOR ANES
INTRAVENOUS | Status: DC | PRN
  Administered 2024-01-30: .07 ug/kg/min via INTRAVENOUS
  Administered 2024-01-30: 0 ug/kg/min via INTRAVENOUS
  Administered 2024-01-30: .05 ug/kg/min via INTRAVENOUS
  Administered 2024-01-30: .07 ug/kg/min via INTRAVENOUS
  Administered 2024-01-30: .03 ug/kg/min via INTRAVENOUS
  Administered 2024-01-30: 0 ug/kg/min via INTRAVENOUS
  Administered 2024-01-30: .05 ug/kg/min via INTRAVENOUS
  Administered 2024-01-30: .06 ug/kg/min via INTRAVENOUS
  Administered 2024-01-30: .05 ug/kg/min via INTRAVENOUS

## 2024-01-30 MED ORDER — ACETAMINOPHEN 325 MG TABLET: 650.0000 mg | ORAL_TABLET | Freq: Four times a day (QID) | ORAL | Status: DC | PRN

## 2024-01-30 MED ORDER — GLYCOPYRROLATE 0.2 MG/ML INJECTION SOLUTION
Freq: Once | INTRAMUSCULAR | Status: DC | PRN
  Administered 2024-01-30: .2 mg via INTRAVENOUS

## 2024-01-30 MED ORDER — ELECTROLYTE-R (PH 7.4) BOLUS
1000.0000 mL | Freq: Once | INTRAVENOUS | Status: AC
  Administered 2024-01-30: 1000 mL via INTRAVENOUS

## 2024-01-30 MED ORDER — ROSUVASTATIN 20 MG TABLET: 20.0000 mg | ORAL_TABLET | Freq: Every evening | ORAL | Status: DC

## 2024-01-30 MED ORDER — ALBUTEROL SULFATE 2.5 MG/3 ML (0.083 %) SOLUTION FOR NEBULIZATION: 2.5000 mg | INHALATION_SOLUTION | RESPIRATORY_TRACT | Status: DC | PRN

## 2024-01-30 MED ORDER — ASPIRIN 81 MG CHEWABLE TABLET
81.0000 mg | CHEWABLE_TABLET | Freq: Every day | ORAL | Status: DC
  Filled 2024-01-30: qty 1

## 2024-01-30 MED ORDER — SODIUM CHLORIDE 0.9% FLUSH BAG - 250 ML: INTRAVENOUS | Status: DC | PRN

## 2024-01-30 MED ORDER — DEXTROSE 5% IN WATER (D5W) FLUSH BAG - 250 ML: INTRAVENOUS | Status: DC | PRN

## 2024-01-30 MED ORDER — PROPOFOL 10 MG/ML IV BOLUS
INJECTION | Freq: Once | INTRAVENOUS | Status: DC | PRN
  Administered 2024-01-30: 100 mg via INTRAVENOUS

## 2024-01-30 MED ORDER — VANCOMYCIN 1 GRAM/200 ML IN DEXTROSE 5 % INTRAVENOUS PIGGYBACK: 15.0000 mg/kg | INJECTION | INTRAVENOUS | Status: DC

## 2024-01-30 MED ORDER — ACETAMINOPHEN 325 MG TABLET
650.0000 mg | ORAL_TABLET | Freq: Four times a day (QID) | ORAL | Status: DC | PRN
  Administered 2024-01-31: 650 mg via GASTROSTOMY
  Filled 2024-01-30: qty 2

## 2024-01-30 MED ORDER — MULTIVITAMIN WITH FOLIC ACID 400 MCG TABLET
1.0000 | ORAL_TABLET | Freq: Every day | ORAL | Status: DC
  Filled 2024-01-30: qty 1

## 2024-01-30 MED ORDER — SENNOSIDES 8.8 MG/5 ML ORAL SYRUP
10.0000 mL | ORAL_SOLUTION | Freq: Two times a day (BID) | ORAL | Status: DC
  Administered 2024-01-31 – 2024-02-01 (×3): 17.6 mg via GASTROSTOMY
  Filled 2024-01-30 (×3): qty 10

## 2024-01-30 MED ORDER — SENNOSIDES 8.8 MG/5 ML ORAL SYRUP: 10.0000 mL | ORAL_SOLUTION | Freq: Two times a day (BID) | ORAL | Status: DC

## 2024-01-30 MED ORDER — SERTRALINE 25 MG TABLET
25.0000 mg | ORAL_TABLET | Freq: Every day | ORAL | Status: DC
  Filled 2024-01-30: qty 1

## 2024-01-30 MED ORDER — DEXAMETHASONE SODIUM PHOSPHATE 4 MG/ML INJECTION SOLUTION
Freq: Once | INTRAMUSCULAR | Status: DC | PRN
  Administered 2024-01-30: 4 mg via INTRAVENOUS

## 2024-01-30 MED ORDER — LIDOCAINE (PF) 20 MG/ML (2 %) INJECTION SOLUTION
Freq: Once | INTRAMUSCULAR | Status: DC | PRN
  Administered 2024-01-30: 80 mg via INTRAVENOUS

## 2024-01-30 MED ORDER — ACETAMINOPHEN 1,000 MG/100 ML (10 MG/ML) INTRAVENOUS SOLUTION
1000.0000 mg | Freq: Four times a day (QID) | INTRAVENOUS | Status: DC | PRN
  Administered 2024-01-30: 1000 mg via INTRAVENOUS
  Filled 2024-01-30: qty 100

## 2024-01-30 MED ORDER — ASPIRIN 325 MG TABLET
325.0000 mg | ORAL_TABLET | Freq: Once | ORAL | Status: AC
  Filled 2024-01-30: qty 1

## 2024-01-30 MED ORDER — ASPIRIN 81 MG CHEWABLE TABLET
81.0000 mg | CHEWABLE_TABLET | Freq: Every day | ORAL | Status: DC
  Administered 2024-01-31: 81 mg via GASTROSTOMY
  Filled 2024-01-30: qty 1

## 2024-01-30 MED ORDER — LACTATED RINGERS INTRAVENOUS SOLUTION
INTRAVENOUS | Status: DC | PRN
  Administered 2024-01-30: 0 via INTRAVENOUS

## 2024-01-30 MED ORDER — PIPERACILLIN-TAZOBACTAM 4.5 GRAM/100 ML DEXTROSE(ISO-OSM) IV PIGGYBACK
4.5000 g | INJECTION | INTRAVENOUS | Status: DC
  Filled 2024-01-30: qty 100

## 2024-01-30 MED ORDER — TIROFIBAN 5 MG/100 ML (50 MCG/ML)-0.9 % SODIUM CHLORIDE INTRAVENOUS
0.0750 ug/kg/min | INTRAVENOUS | Status: AC
  Administered 2024-01-30: .069 ug/kg/min via INTRAVENOUS
  Administered 2024-01-31: 0.075 ug/kg/min via INTRAVENOUS
  Administered 2024-01-31: 0 ug/kg/min via INTRAVENOUS
  Administered 2024-01-31: 0.075 ug/kg/min via INTRAVENOUS
  Filled 2024-01-30: qty 100

## 2024-01-30 MED ORDER — VANCOMYCIN IV - PHARMACIST TO DOSE PER PROTOCOL: Freq: Every day | Status: DC | PRN

## 2024-01-30 MED ORDER — FAMOTIDINE 20 MG TABLET
20.0000 mg | ORAL_TABLET | Freq: Two times a day (BID) | ORAL | Status: DC
  Filled 2024-01-30 (×2): qty 1

## 2024-01-30 MED ORDER — ATORVASTATIN 40 MG TABLET: 40.0000 mg | ORAL_TABLET | Freq: Every evening | ORAL | Status: DC

## 2024-01-30 MED ORDER — PIPERACILLIN-TAZOBACTAM 4.5 GRAM/100 ML DEXTROSE(ISO-OSM) IV PIGGYBACK
4.5000 g | INJECTION | Freq: Three times a day (TID) | INTRAVENOUS | Status: DC
  Filled 2024-01-30: qty 100

## 2024-01-30 MED ORDER — HYDROCODONE 5 MG-ACETAMINOPHEN 325 MG TABLET
1.0000 | ORAL_TABLET | ORAL | Status: DC | PRN
  Administered 2024-01-30: 0 via ORAL
  Filled 2024-01-30: qty 1

## 2024-01-30 MED ORDER — POLYETHYLENE GLYCOL 3350 17 GRAM ORAL POWDER PACKET
17.0000 g | Freq: Every day | ORAL | Status: DC
  Administered 2024-01-31: 17 g via GASTROSTOMY
  Filled 2024-01-30: qty 1

## 2024-01-30 MED ORDER — CYANOCOBALAMIN (VIT B-12) 500 MCG TABLET
1000.0000 ug | ORAL_TABLET | Freq: Every day | ORAL | Status: DC
  Filled 2024-01-30: qty 2

## 2024-01-30 MED ORDER — NOREPINEPHRINE BITARTRATE 16 MG/250 ML (64 MCG/ML) IN 0.9 % NACL IV
0.0000 ug/kg/min | INTRAVENOUS | Status: DC
  Administered 2024-01-31: 0.05 ug/kg/min via INTRAVENOUS
  Administered 2024-01-31: 0.09 ug/kg/min via INTRAVENOUS
  Administered 2024-01-31: 0.07 ug/kg/min via INTRAVENOUS
  Administered 2024-01-31: 0.05 ug/kg/min via INTRAVENOUS
  Administered 2024-01-31: 0.03 ug/kg/min via INTRAVENOUS
  Administered 2024-01-31 (×2): 0.07 ug/kg/min via INTRAVENOUS
  Administered 2024-01-31: 0.06 ug/kg/min via INTRAVENOUS
  Administered 2024-01-31: 0.07 ug/kg/min via INTRAVENOUS
  Administered 2024-01-31 (×2): 0.05 ug/kg/min via INTRAVENOUS
  Administered 2024-01-31: 0.07 ug/kg/min via INTRAVENOUS
  Administered 2024-01-31: 0.08 ug/kg/min via INTRAVENOUS
  Administered 2024-02-01: 0 ug/kg/min via INTRAVENOUS
  Administered 2024-02-01: 0.03 ug/kg/min via INTRAVENOUS

## 2024-01-30 MED ORDER — DEXTROSE 40 % ORAL GEL: 15.0000 g | ORAL | Status: DC | PRN

## 2024-01-30 MED ORDER — DEXTROSE 50 % IN WATER (D50W) INTRAVENOUS SYRINGE: 12.5000 g | INJECTION | INTRAVENOUS | Status: DC | PRN

## 2024-01-30 MED ORDER — ROSUVASTATIN 20 MG TABLET
20.0000 mg | ORAL_TABLET | Freq: Every evening | ORAL | Status: DC
  Administered 2024-01-31: 20 mg via GASTROSTOMY
  Filled 2024-01-30 (×2): qty 1

## 2024-01-30 MED ORDER — NOREPINEPHRINE BITARTRATE 16 MG/250 ML (64 MCG/ML) IN 0.9 % NACL IV
INTRAVENOUS | Status: AC
  Filled 2024-01-30: qty 250

## 2024-01-30 MED ORDER — SODIUM CHLORIDE 0.9 % (FLUSH) INJECTION SYRINGE
2.0000 mL | INJECTION | Freq: Three times a day (TID) | INTRAMUSCULAR | Status: DC
  Administered 2024-01-31: 3 mL
  Administered 2024-01-31: 6 mL
  Administered 2024-01-31 – 2024-02-01 (×2): 5 mL

## 2024-01-30 MED ORDER — ASPIRIN 300 MG RECTAL SUPPOSITORY
300.0000 mg | Freq: Once | RECTAL | Status: DC
  Filled 2024-01-30: qty 1

## 2024-01-30 NOTE — Progress Notes (Signed)
 [facility]  Internal Medicine Progress Note  FULL CODE: ATTEMPT RESUSCITATION/CPR    Name: Bridget [name]    Date of service: 01/30/2024    Chief Complaint: Fall (1 hour ago, left leg gave out, pt fell, did not hit head, no loc.  Has pain in left femur, foot and knee, declines tylenol )       Subjective   Patient is pleasantly confused. She is seen following surgery without complaints. Her family is present at bedside and updated on plan of care. He is very concerned about there being a problem with her knee that caused her to fall      Allergies[1]    Objective     Vital Signs:  Blood pressure (!) 153/77, pulse 89, temperature 36.8 C (98.2 F), resp. rate 17, height 1.6 m (5' 3), weight 72.3 kg (159 lb 6.3 oz), SpO2 97%.       Physical Exam:  Nursing note and vitals reviewed.  General: 89 y.o. female, appears stated age, in no acute distress  Eyes: PERRLA. EOM intact. Clear conjunctivae.  HENT: NCAT. Oral and nasal mucosa moist without erythema or exudate.   Neck: Soft and non-tender. No lymphadenopathy.  CV: Regular rate and rhythm without murmurs  Lungs: Clear to auscultation bilaterally. No wheezes, crackles or rhonchi. Comfortable work of breathing on RA  Abdomen: Soft, non-tender, non-distended, bowel sounds present. +foley  Neuro: Alert and confused. No apparent focal neurological deficits.  Musculoskeletal: Grossly normal limb movements without obvious deformity. Left leg bandage is c/d/i  Extremities: Nonpitting peripheral edema  Skin:Warm and dry. No visible rashes or lesions.   Psych: Behavior, speech and affect are appropriate.     Lines, Drains, and Airways:  Patient Lines/Drains/Airways Status       Active Line / Dialysis Catheter / Dialysis Graft / Drain / Airway / Wound       Name Placement date Placement time Site Days    Peripheral IV Ultrasound guided Right Basilic  (medial side of arm) 01/29/24  2330  -- less than 1    Foley Catheter 01/28/24  1921  -- 1    Wound  Incision Left;Outer  Thigh 01/29/24  1032  -- less than 1                     Labs:  Results for orders placed or performed during the hospital encounter of 01/28/24 (from the past 24 hours)   BASIC METABOLIC PANEL, NON-FASTING   Result Value Ref Range    SODIUM 141 136 - 145 mmol/L    POTASSIUM 4.1 3.5 - 5.1 mmol/L    CHLORIDE 105 96 - 111 mmol/L    CO2 TOTAL 29 23 - 31 mmol/L    ANION GAP 7 4 - 13 mmol/L    CALCIUM  9.0 8.6 - 10.3 mg/dL    GLUCOSE 868 (H) 65 - 125 mg/dL    BUN 12 8 - 25 mg/dL    CREATININE 9.13 9.39 - 1.05 mg/dL    eGFRcr - FEMALE 65 >=39 mL/min/1.15m2    BUN/CREA RATIO 14 6 - 22   MAGNESIUM   Result Value Ref Range    MAGNESIUM 1.9 1.8 - 2.6 mg/dL   CBC WITH DIFF   Result Value Ref Range    WBC 13.4 (H) 3.7 - 11.0 x103/uL    RBC 5.08 3.85 - 5.22 x106/uL    HGB 13.9 11.5 - 16.0 g/dL    HCT 56.9 65.1 - 53.9 %  MCV 84.6 78.0 - 100.0 fL    MCH 27.4 26.0 - 32.0 pg    MCHC 32.3 31.0 - 35.5 g/dL    RDW-CV 85.6 88.4 - 84.4 %    PLATELETS 189 150 - 400 x103/uL    MPV 9.7 8.7 - 12.5 fL    NEUTROPHIL % 87.7 %    LYMPHOCYTE % 4.8 %    MONOCYTE % 5.2 %    EOSINOPHIL % 1.6 %    BASOPHIL % 0.3 %    NEUTROPHIL # 11.74 (H) 1.50 - 7.70 x103/uL    LYMPHOCYTE # 0.64 (L) 1.00 - 4.80 x103/uL    MONOCYTE # 0.70 0.20 - 1.10 x103/uL    EOSINOPHIL # 0.22 <=0.50 x103/uL    BASOPHIL # <0.10 <=0.20 x103/uL    IMMATURE GRANULOCYTE % 0.4 0.0 - 1.0 %    IMMATURE GRANULOCYTE # <0.10 <0.10 x103/uL         No results found for any visits on 01/28/24 (from the past 24 hours).     Radiology:    Results for orders placed or performed during the hospital encounter of 01/28/24   XR FEMUR LEFT     Status: None    Narrative    Bridget [name]  Female, 89 years old.    XR FEMUR LEFT- 2 VIEWS performed on 01/28/2024 6:22 PM.    REASON FOR EXAM:  fall    TECHNIQUE: 2 views/5 images submitted for interpretation.    COMPARISON: CT urogram performed 08/14/2018.    FINDINGS:  There is a displaced fracture of the left femoral neck. The femoral head  appears to remain seated within the left acetabulum. Adjacent surgical clips project over the left inferior pubic ramus. No additional fracture is identified in the femur. Prominent degenerative changes are partially seen at the level of the knee.      Impression    Displaced left femoral neck fracture.        Radiologist location ID: [id number]     XR KNEE LEFT 4 OR MORE VIEWS     Status: None    Narrative    Bridget [name]  Female, 89 years old.    XR KNEE LEFT 4 OR MORE VIEWS performed on 01/28/2024 6:21 PM.    REASON FOR EXAM:  fall    TECHNIQUE: 4 views/4 images submitted for interpretation.    COMPARISON: Radiograph of the femur performed the same day.    FINDINGS:  There is grossly preserved alignment of the left knee. Tricompartmental degenerative arthrosis is present, with joint space narrowing greatest in the medial tibiofemoral compartment. No definite fracture is identified. Sclerotic density in the distal femur is likely a chronic finding. A prominent osteophyte is seen arising from the superior patella. There is no joint effusion or significant overlying soft tissue swelling.      Impression    Degenerative changes of the knee without acute bony abnormality.        Radiologist location ID: [id number]     XR CHEST AP     Status: None    Narrative    Bridget [name]  Female, 89 years old.    XR CHEST AP performed on 01/28/2024 6:23 PM.    REASON FOR EXAM:  lung eval, fall     TECHNIQUE: 1 views/1 images submitted for interpretation.    COMPARISON: 10/22/2022    FINDINGS:    Cardiac silhouette and mediastinal structures are unremarkable and stable in appearance. The lungs are  clear and expanded with no consolidations and no effusions. Unremarkable bowel gas pattern. Degenerative change in the spine.      Impression    Clear lungs, no cardiomegaly and the lung volumes are normal.        Radiologist location ID: [id number]     XR FOOT LEFT 2 VIEW     Status: None    Narrative    Bridget MAE  [name]  Female, 89 years old.    XR FOOT LEFT 2 VIEWS performed on 01/28/2024 6:22 PM.    REASON FOR EXAM:  fall    TECHNIQUE: 2 views/2 images submitted for interpretation.    COMPARISON: Radiographs of the left lower extremity performed the same day.    FINDINGS:  Tarsal alignment appears to be grossly preserved within the limits of this nonweightbearing exam. Degenerative changes are seen throughout. There is hallux valgus with corresponding moderate degenerative arthrosis of the first MTP joint. Irregular lucency is seen traversing the distal second proximal phalanx, involving the PIP joint, concerning for fracture. No additional fracture is identified. A calcaneal bone spur is identified.      Impression    Suspect nondisplaced fracture at the distal second proximal phalanx, extending to the PIP joint.        Radiologist location ID: [id number]     XR HIP LEFT W PELVIS 2-3 VIEWS     Status: None    Narrative    Axel MAE Humphrey  Female, 89 years old.    XR HIP LEFT W PELVIS 2-3 VIEWS performed on 01/28/2024 7:16 PM.    REASON FOR EXAM:  fall, hip pain, assess fx     TECHNIQUE: 2 views/3 images submitted for interpretation.    COMPARISON: Radiograph of the femur performed the same day, CT urogram performed 08/04/2018.    FINDINGS:  There is an acute displaced fracture of the left femoral neck, with slight superior and lateral subluxation of the proximal femur. Additional fracture of the greater trochanter cannot be excluded. The femoral head remains seated in the acetabulum with moderate degenerative arthrosis. There is also moderate degenerative arthrosis of the right hip. There is no widening of the pubic symphysis or sacroiliac joints, both of which demonstrate degenerative change. There is limited evaluation of the sacrum due to overlying bowel gas. Surgical clips project over the bilateral inferior pubic rami. Degenerative changes are also seen in the spine.      Impression    Displaced left femoral neck  fracture.        Radiologist location ID: [id number]     CT BRAIN WO IV CONTRAST     Status: None    Narrative    Terrion MAE Fontenot  Female, 89 years old.    CT BRAIN WO IV CONTRAST performed on 01/28/2024 8:49 PM    INDICATION: 89 years old Female fall    TECHNIQUE: CT of the head acquired without contrast administration    RADIATION DOSE: 2010.20 mGy.cm    COMPARISON: CT head and neck October 23, 2022    FINDINGS:     Brain: Periventricular lucency could relate to chronic microvascular ischemic disease. No acute territorial infarction or intraparenchymal hemorrhage. No mass effect. No midline shift.    Ventricles/extra-axial spaces: No hydrocephalus. No extra-axial hemorrhage or fluid collection.    Other findings: None.        Impression    Chronic changes without acute intracranial abnormality.            Radiologist  location ID: [id number]     CT CERVICAL SPINE WO IV CONTRAST     Status: None    Narrative    Jenetta MAE Vondra  Female, 89 years old.    CT CERVICAL SPINE WO IV CONTRAST performed on 01/28/2024 8:49 PM    INDICATION: 89 years old Female fall    TECHNIQUE: CT of the cervical spine acquired without contrast.    RADIATION DOSE: 2010.20 mGy.cm    COMPARISON: CTA head and neck October 23, 2022    FINDINGS:   Bones are demineralized limiting evaluation for acute fracture.  Craniocervical junction: No acute fracture or dissociation.    Subaxial alignment and vertebrae: No distraction or translation injury. Vertebral bodies and posterior elements are intact.    Discs, spinal canal, and neural foramina: Multilevel degenerative disc changes.    Perivertebral/neck soft tissues: No acute abnormality.    Other findings including visualized chest: Left inferior thyroid  nodule measuring 1.5 cm.        Impression    No acute fracture or traumatic subluxation.        Radiologist location ID: [id number]     CT CHEST ABDOMEN PELVIS W IV CONTRAST     Status: None    Narrative    Aseel MAE Wolfgang  Female,  89 years old.    CT CHEST ABDOMEN PELVIS W IV CONTRAST performed on 01/28/2024 8:49 PM.    REASON FOR EXAM:  fall, fall    RADIATION DOSE: 2010.20 mGy.cm    CONTRAST: 100 ml's of Isovue  370    TECHNIQUE: Contiguous axially collimated images were obtained from the lung apices through the proximal femurs. Postprocessing of the original data set was performed.  Coronal reformatted images were prepared on a separate workstation and reviewed for anatomic correlation. This CT exam was performed using one or more of the following dose reduction techniques: Automated exposure control, adjustment of the mA and/or kV according to patient size, or use of iterative reconstruction technique.    COMPARISON: 08/04/2018    FINDINGS:    CHEST:    HEART/MEDIASTINUM: Heart is normal in size. No pericardial effusion. No pulmonary embolus, the main pulmonary artery is normal in size. Moderate atherosclerotic calcifications throughout.    LYMPH NODES/ESOPHAGUS: The esophagus is normal and there is no lymphadenopathy. No axillary lymphadenopathy.    LUNGS/PLEURA: Lungs are clear without consolidation. No pulmonary nodules or masses.    SOFT TISSUE/BONES: The thyroid  is heterogeneous with scattered nodules, recommend nonemergent thyroid  ultrasound is an outpatient.    ABDOMEN/PELVIS:    VASCULATURE: Moderate atherosclerotic disease throughout the IVC is normal.    LIVER: The liver is fatty infiltrated, normal in size measuring 15 cm cranial caudal dimension. The portal and hepatic veins are patent.    BILIARY SYSTEM/GALLBLADDER: The gallbladder is mildly distended with no calcified stones, no secondary signs of acute cholecystitis. No intra or extrahepatic biliary dilatation.    SPLEEN: The spleen is normal in size measuring 12.5 cm with no splenic lesions. The splenic vein is patent.    PANCREAS: Pancreas is normal with no ductal dilatation.    ADRENALS: The right adrenal is normal and there is mild symmetric thickening in the left adrenal  with no discrete mass.    KIDNEY/URETERS/URINARY BLADDER: Bilateral renal cysts consistent with Bosniak 1/Bosniak 2 cyst. There is no hydronephrosis or hydroureter and there is no calcified stones. There is a Foley catheter in a decompressed urinary bladder with a small amount of air in the bladder.  REPRODUCTIVE ORGANS: The uterus is atrophied with low density in the area of the endometrial stripe. No adnexal abnormalities.    GI/BOWEL: The distal esophagus, stomach, duodenum, small bowel loops are normal. The terminal ileum and appendix are normal. There is a moderate stool load in the colon with fairly severe diverticular disease in the sigmoid colon, no secondary signs of acute diverticulitis.    PERITONEAL CAVITY/LYMPH NODES:  No retroperitoneal lymphadenopathy, no free fluid or free air in the abdomen or pelvis.    SOFT TISSUES/BONES: No soft tissue abnormalities. There is a left mildly impacted subcapital femoral neck fracture. There is degenerative change in the spine and there is facet joint arthropathy and moderate degenerative disc disease throughout the spine. Degenerative change in the visualized cervical spine.      Impression    Mildly impacted subcapital left femoral neck fracture.    No other intrathoracic or intra-abdominal traumatic injuries are seen.    Hepatic steatosis.    Renal cyst, Bosniak 1/Bosniak 2 cyst.    There is a small amount of fluid seen in the endometrial cavity, recommend nonemergent pelvic ultrasound.    Scattered thyroid  nodules, if further workup is indicated outpatient ultrasound of the thyroid  can be performed.    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                      Radiologist location ID: [id number]     XR LOW PELVIS W LEFT LATERAL HIP     Status: None    Narrative    Laykin MAE Cappuccio  Female, 89 years old.    XR LOW PELVIS W LEFT LATERAL HIP performed on 01/29/2024 2:59 PM.    REASON FOR EXAM:  postop pacu,  Post Lt Hip Sx     TECHNIQUE: 2 views/2 images submitted for interpretation.    COMPARISON: CT chest abdomen pelvis performed 01/28/2024.    FINDINGS:  Interval postoperative changes are identified from left hip arthroplasty. Alignment appears appropriate. There is expected postoperative subcutaneous air and soft tissue swelling, with a surgical drain and skin staples in place. Postoperative intraosseous gas is also suspected in the femur. Moderate degenerative arthrosis is noted in the right hip. Degenerative changes are seen throughout the pelvis.      Impression    Expected acute postoperative changes from left hip arthroplasty.        Radiologist location ID: [id number]          Inpatient Medications:  acetaminophen  (TYLENOL ) tablet, 650 mg, Oral, Q4H PRN  amLODIPine  (NORVASC ) tablet, 5 mg, Oral, Daily  ceFAZolin  (ANCEF ) 1 g in NS 100 mL IVPB with adaptor, 1 g, Intravenous, Q8H  [Held by provider] clopidogrel  (PLAVIX ) 75 mg tablet, 75 mg, Oral, Daily  donepezil  (ARICEPT ) tablet, 5 mg, Oral, NIGHTLY  enoxaparin  PF (LOVENOX ) 40 mg/0.4 mL SubQ injection, 40 mg, Subcutaneous, Daily  lisinopril  (PRINIVIL ) tablet, 20 mg, Oral, Daily   And  hydroCHLOROthiazide  (HYDRODIURIL ) tablet, 12.5 mg, Oral, Daily  levothyroxine  (SYNTHROID ) tablet, 100 mcg, Oral, QAM  LR premix infusion, , Intravenous, Continuous  morphine  2 mg/mL injection, 2 mg, Intravenous, Q4H PRN  NS flush syringe, 3 mL, Intracatheter, Q8HRS  NS flush syringe, 3 mL, Intracatheter, Q1H PRN  ondansetron  (ZOFRAN ) 2 mg/mL injection, 4 mg, Intravenous, Q6H PRN  oxyCODONE  (ROXICODONE ) immediate release tablet, 5 mg, Oral, Q4H PRN  rosuvastatin  (CRESTOR ) tablet, 20 mg, Oral, QPM        Nutrition:  DIET CARDIAC (CAFFEINE PERMITTED, 2G NA, LOWFAT, LOW CHOL) Do you want to initiate MNT Protocol? Yes    Additional clinical characteristics related to nutrition:    - monitor for weight changes   - monitor intake and output    - monitor bowel functions           Assessment/ Plan:   Left femoral neck fracture s/p L Hip Hemiarthroplasty on 01/29/24  Left 2nd toe fracture  -WBAT with FWW  -Do not change dressing  -Hip precautions  -remove foley tomorrow  -perioperative Ancef   -pain, nausea medications PRN. Bowel regimen. GI and DVT prophylaxis.  -ortho consult. Appreciate assistance  -further treatment osteoporosis outpatient    Small amount of fluid seen in the endometrial cavity  -noted on CT scan  -outpatient work-up     Thyroid  nodules  -noted on CT scan  -last ultrasound 10/2022 with recommended yearly ultrasounds  -non emergent outpatient ultrasound     History of CVA  - Hold prescribed Plavix  for anticipated surgical procedure     Hypertension  - Continue prescribed amlodipine  and Zestoretic      Hypothyroidism  - Continue prescribed levothyroxine      Mild cognitive impairment  -continue prescribed Aricept     DVT Prophylaxis: Lovenox   Full Code  Disposition: PT/OT    Wanda Nyhan, MD             [1] No Known Allergies

## 2024-01-30 NOTE — Nurses Notes (Signed)
 Report phoned to IR at [facility].   Bedside report given to [facility].   Pt stable for transfer to [facility]     Belongings sent with husband who is present at bedside      Greig Glance, RN

## 2024-01-30 NOTE — Progress Notes (Addendum)
 Hospitalist Rapid Response Note  [facility]     Time of overhead call: 6:26 am    Time of Arrival/Response Time: 6:26 am    Clinical Presentation: AMS, drowsy    Further Management Plan:   Opens eyes and moans to painful stimuli. Pupils constricted, but last opioid pain meds were at around 10 pm.   Vitals stable except for new temp 101.  She is moving her toes spontaneously as well as the right arm but does not move her left arm therefore stroke alert was called.  Last known well was around 4 30 am. last night.  Her NIH stroke scale was 23.  Patient also has left-sided facial droop.    Differentials include stroke versus hospital delirium versus fat embolus in setting of recent leg surgery versus versus hypercapnia    - CT scan stroke protocol.  Patient taken for CT scan immediately.  Patient will be transferred to [facility] floor.  Due to recent surgery patient is a poor candidate for tenecteplase if she has an ischemic stroke.   - Stroke alert was called.  [facility] line was contacted, tele stroke physician evaluated the patient.  It was deemed that patient is having acute ICA occlusion leading to HER ACUTE ISCHEMIC STROKE SYMPTOMS.  Patient is not a candidate for tenecteplase due to her surgery but is a candidate for thrombectomy.  Tele stroke physician is going to reach out to [facility] and discussed with to be Neurology to transfer the patient for thrombectomy.  Tele stroke physician will call us  back about the final plan about transfer. Primary attending Wanda Greet made aware  - ABG normal  - CBC BMP troponin pending  -  Patient also spiked a fever of 101 for which I prudent her antibiotic coverage.    Glenda Meter, MD

## 2024-01-30 NOTE — Anesthesia Preprocedure Evaluation (Signed)
 ANESTHESIA PRE-OP EVALUATION  Planned Procedure: IR STROKE THERAPY  Review of Systems     anesthesia history negative     patient summary reviewed          Pulmonary  negative pulmonary ROS,    Cardiovascular    Hypertension, poorly controlled and hyperlipidemia ,       GI/Hepatic/Renal   negative GI/hepatic/renal ROS,         Endo/Other   neg endo/other ROS,       Neuro/Psych/MS    CVA     Cancer    negative hematology/oncology ROS,                     Physical Assessment      Airway       Mallampati: unable to assess    TM distance: 3 FB    Neck ROM: full    No Facial hair  No Beard        Dental           (+) poor dentition           Pulmonary           Cardiovascular    Rhythm: regular  Rate: Normal       Other findings              Plan  ASA 4     Planned anesthesia type: general     general anesthesia with endotracheal tube intubation                    Intravenous induction       Anesthetic plan and risks discussed with   Due to emergent nature of case consent not obtained                   Plan discussed with resident.

## 2024-01-30 NOTE — Nurses Notes (Signed)
 Patient taken back to room in bed on vent via RN and RT.

## 2024-01-30 NOTE — H&P (Signed)
 NIH Stroke Scale    Inital assessment  Date of EXAM: 01/30/2024        Time of EXAM: 0954  Person Administering Scale: Delon HERO Vidalia Serpas, APRN, CNP      1a.  1 Level of consciousness:     0 = Alert; keenly responsive.   1 = Not alert; but arousable by minor stimulation   2 = Not alert; requires repeated stimulation  3 = COMA   1b. 2 LOC questions:   Ask patient month/Their age.   0 = Answers both questions correctly.   1 = Answers one question correctly.   2 = Answers neither question correctly.     1c. 2 LOC commands:  Ask patient to open/close eyes.   0 = OBEYS both tasks correctly.   1 = OBEYS one task correctly.   2 = OBEYS neither task correctly.     2.  1 Best Gaze:  Only horizontal eye movements will be tested.   0 = Normal.   1 = Partial gaze palsy  2 = Forced deviation   3. 2  Visual:   0 = No visual loss.   1 = Partial hemianopia.   2 = Complete hemianopia.   3 = Bilateral hemianopia (blind including cortical blindness).       4. 1 Facial Palsy:  Ask patient to show teeth or raise eyebrows and close eyes. 0 = Normal symmetrical movements.   1 = Minor paralysis (flattened nasolabial fold, asymmetry on smiling).   2 = Partial paralysis (total or near-total paralysis of lower face).   3 = Complete paralysis of one or both sides (absence of facial movement in        the upper and lower face).     5a. 4  Motor left arm:     0 = No drift; limb holds 90 (or 45) degrees for full 10 seconds.   1 = Drift  2 = Some effort against gravity  3 = No effort against gravity  4 = No movement.   UN = UNTESTABLE(Joint fused/limb amputated)     5b.  1 Motor right arm:     0 = No drift; limb holds 90 (or 45) degrees for full 10 seconds.   1 = Drift  2 = Some effort against gravity  3 = No effort against gravity  4 = No movement.   UN = UNTESTABLE(Joint fused/limb amputated)     6a. 3 Motor left leg:     0 = No drift; leg holds 30-degree position for full 5 seconds.   1 = Drift  2 = Some effort against gravity  3 = No effort  against gravity  4 = No movement.   UN = UNTESTABLE(Joint fused/lim amputated)       6b.  1 Motor right leg:      0 = No drift; leg holds 30-degree position for full 5 seconds.   1 = Drift  2 = Some effort against gravity  3 = No effort against gravity  4 = No movement.   UN = UNTESTABLE(Joint fused/lim amputated)     7. 0 Limb Ataxia:     0 = No Ataxia  1 = Present in 1 limb.   2 = Present in 2 limbs.         8.  1 Sensory:  Use pinprick to test arms/legs/trunk/face,compare side to side.   0 = Normal; no sensory loss.   1 = Mild-to-moderate  sensory loss  2 = Severe to total sensory loss     9. 2 Best Language:   Describe picture/name items/read sentences.   0 = No aphasia; normal.  1 = Mild-to-moderate aphasia  2 = Severe aphasia  3 = Mute       10. 2 Dysarthria:   (Read several words)     0 = Normal Articulation  1 = Mild-to-moderate slurring  2 = near unintelligible or unalbe to speak  UN = Intubated or other physical barrier   11.  2 Extinction and Inattention:     0 = Normal  1 = Inattention or extinction to bilateral simultaneous stimulation in one       sensory modality  2 = Severe Hemi-inattention or Hem-attention to more than one modality            Total SCORE:  25    Delon Sierras, MSN, APRN, FNP-C  APP Neurology/Stroke

## 2024-01-30 NOTE — Care Plan (Signed)
 Problem: Adult Inpatient Plan of Care  Goal: Absence of Hospital-Acquired Illness or Injury  Outcome: Ongoing (see interventions/notes)  Intervention: Identify and Manage Fall Risk  Recent Flowsheet Documentation  Taken 01/29/2024 2100 by Armida NOVAK, LPN  Safety Promotion/Fall Prevention:   safety round/check completed   nonskid shoes/slippers when out of bed  Intervention: Prevent Skin Injury  Recent Flowsheet Documentation  Taken 01/29/2024 2300 by Armida NOVAK, LPN  Skin Protection: adhesive use limited  Taken 01/29/2024 2100 by Armida NOVAK, LPN  Body Position: supine, head elevated  Skin Protection: adhesive use limited  Intervention: Prevent and Manage VTE (Venous Thromboembolism) Risk  Recent Flowsheet Documentation  Taken 01/29/2024 2100 by Armida NOVAK, LPN  Sequential Compression Device (SCDs): Sequential compression device on  VTE Prevention/Management: dorsiflexion/plantar flexion performed  Taken 01/29/2024 2000 by Armida NOVAK, LPN  Sequential Compression Device (SCDs): Sequential compression device on  Intervention: Prevent Infection  Recent Flowsheet Documentation  Taken 01/29/2024 2100 by Armida NOVAK, LPN  Infection Prevention: promote handwashing     Problem: Skin Injury Risk Increased  Goal: Skin Health and Integrity  Outcome: Ongoing (see interventions/notes)  Intervention: Optimize Skin Protection  Recent Flowsheet Documentation  Taken 01/29/2024 2300 by Armida NOVAK, LPN  Pressure Reduction Techniques: Frequent weight shifting encouraged  Pressure Reduction Devices:   Repositioning wedges/pillows utilized   Specialty bed/mattress utilized  Skin Protection: adhesive use limited  Taken 01/29/2024 2100 by Armida NOVAK, LPN  Pressure Reduction Techniques: Frequent weight shifting encouraged  Pressure Reduction Devices: Repositioning wedges/pillows utilized  Skin Protection: adhesive use limited  Head of Bed (HOB) Positioning: HOB elevated     Armida Pin, LPN

## 2024-01-30 NOTE — Consults (Signed)
 Telestroke Consult Note      Bridget [name], Bridget [name], 89 y.o. female  MRN:  [id number]     Date of Admission:  (Not on file)  Date of Birth:  1934/04/28  Date of Service:  01/30/2024      Patient/Family consent for Telemedicine- Yes  Location [facility] Consultation:  [facility], [address], [place] [address]  Telestroke Consult Requested by Dr. Dominique Betters  Time of Call: 07:17  Time of Return Call: 07:17  Video Connection: Yes  Technical Issues: No      Chief Complaint: Acute onset left sided weakness and altered mental status     History of Present Illness:  Bridget [name] is a 89 y.o. female currently admitted, s/p left hip replacement, history of stroke, had acute neurological change this morning for which code stroke was activated. Patient developed acute onset left sided weakness and encephalopathy. Differed TENECTEplase due to hip replacement this admission. Family agreed after risk benefit discussion. Patient is a potential intervention candidate.      Last Known Well: 01/30/2023 04:00    PHYSICAL EXAMINATION (indirectly performed by observation via teleconference)    1a.  2 Level of consciousness:     0 = Alert; keenly responsive.   1 = Not alert; but arousable by minor stimulation   2 = Not alert; requires repeated stimulation  3 = COMA   1b. 2 LOC questions:   Ask patient month/Their age.   0 = Answers both questions correctly.   1 = Answers one question correctly.   2 = Answers neither question correctly.     1c. 1 LOC commands:  Ask patient to open/close eyes.   0 = OBEYS both tasks correctly.   1 = OBEYS one task correctly.   2 = OBEYS neither task correctly.     2.  0 Best Gaze:  Only horizontal eye movements will be tested.   0 = Normal.   1 = Partial gaze palsy  2 = Forced deviation   3. 0  Visual:   0 = No visual loss.   1 = Partial hemianopia.   2 = Complete hemianopia.   3 = Bilateral hemianopia (blind including cortical blindness).       4. 2 Facial Palsy:  Ask patient to show teeth or  raise eyebrows and close eyes. 0 = Normal symmetrical movements.   1 = Minor paralysis (flattened nasolabial fold, asymmetry on smiling).   2 = Partial paralysis (total or near-total paralysis of lower face).   3 = Complete paralysis of one or both sides (absence of facial movement in the upper and lower face).     5a. 4  Motor left arm:     0 = No drift; limb holds 90 (or 45) degrees for full 10 seconds.   1 = Drift  2 = Some effort against gravity  3 = No effort against gravity  4 = No movement.   9 = UNTESTABLE(Joint fused/limb amputated)     5b.  1 Motor right arm:     0 = No drift; limb holds 90 (or 45) degrees for full 10 seconds.   1 = Drift  2 = Some effort against gravity  3 = No effort against gravity  4 = No movement.   9 = UNTESTABLE(Joint fused/limb amputated)     6a. 2 Motor left leg:     0 = No drift; leg holds 30-degree position for full 5 seconds.   1 =  Drift  2 = Some effort against gravity  3 = No effort against gravity  4 = No movement.   9 = UNTESTABLE(Joint fused/lim amputated)       6b.  0 Motor right leg:      0 = No drift; leg holds 30-degree position for full 5 seconds.   1 = Drift  2 = Some effort against gravity  3 = No effort against gravity  4 = No movement.   9 = UNTESTABLE(Joint fused/lim amputated)     7. 0 Limb Ataxia:     0 = No Ataxia  1 = Present in 1 limb.   2 = Present in 2 limbs.         8.  1 Sensory:  Use pinprick to test arms/legs/trunk/face,compare side to side.   0 = Normal; no sensory loss.   1 = Mild-to-moderate sensory loss  2 = Severe to total sensory loss     9. 3 Best Language:   Describe picture/name items/read sentences.   0 = No aphasia; normal.  1 = Mild-to-moderate aphasia  2 = Severe aphasia  3 = Mute       10. 2 Dysarthria:   (Read several words)     0 = Normal Articulation  1 = Mild-to-moderate slurring  2 = near unintelligible or unalbe to speak  9 = Intubated or other physical barrier   11.  0 Extinction and Inattention:     0 = Normal  1 = Inattention or  extinction to bilateral simultaneous stimulation in one sensory modality  2 = Severe Hemi-inattention or Hem-attention to more than one modality        NIHSS (approximate)= 20       BP: 134/    Modified Rankin: 1    I personally reviewed the following images.  My findings and interpretations are as follows:  CT brain w/o contrast  no bleed  CTA (if available)  right ica stenosis/occlusion      Assessment:    1.  Ischemic:  Yes  2. Symptomatic right ICA    Did not give TENECTEplase due to open hip replacement this admission. Discussed with NIR at [facility], patient is a potential intervention candidate. Reached out to [facility] and [facility] to ED stroke page. Continue aspirin , Plavix , iv fluids to keep blood pressure elevated.        Patient  does have thrombolytic contraindications: open hip replacement               Recommendations:  IV thrombolytic administration:  No  CTA/ Endovascular Evaluation:  yes  3.   Transfer for higher level of care:  Yes Endovascular Therapy: Yes    Total Consult Time in Minutes: 30          Annmarie Lash, MD 01/30/2024, 07:41  Associate Professor, Department of Neurology  [name]  [facility]

## 2024-01-30 NOTE — Nurses Notes (Signed)
 Patient arrived to MRI in bed on vent via RN and RT. ICU RN to monitor during scan.

## 2024-01-30 NOTE — Progress Notes (Signed)
 NIH Stroke Scale    1 hour post treatment assessment  Date of EXAM: 01/30/24        Time of EXAM: 1200  Person Administering Scale: Glendia DELENA Molt, APRN, CNP      1a.  2 Level of consciousness:     0 = Alert; keenly responsive.   1 = Not alert; but arousable by minor stimulation   2 = Not alert; requires repeated stimulation  3 = COMA   1b. 2 LOC questions:   Ask patient month/Their age.   0 = Answers both questions correctly.   1 = Answers one question correctly.   2 = Answers neither question correctly.     1c. 2 LOC commands:  Ask patient to open/close eyes.   0 = OBEYS both tasks correctly.   1 = OBEYS one task correctly.   2 = OBEYS neither task correctly.     2.  1 Best Gaze:  Only horizontal eye movements will be tested.   0 = Normal.   1 = Partial gaze palsy  2 = Forced deviation   3. 0  Visual:   0 = No visual loss.   1 = Partial hemianopia.   2 = Complete hemianopia.   3 = Bilateral hemianopia (blind including cortical blindness).       4. 2 Facial Palsy:  Ask patient to show teeth or raise eyebrows and close eyes. 0 = Normal symmetrical movements.   1 = Minor paralysis (flattened nasolabial fold, asymmetry on smiling).   2 = Partial paralysis (total or near-total paralysis of lower face).   3 = Complete paralysis of one or both sides (absence of facial movement in        the upper and lower face).     5a. 4  Motor left arm:     0 = No drift; limb holds 90 (or 45) degrees for full 10 seconds.   1 = Drift  2 = Some effort against gravity  3 = No effort against gravity  4 = No movement.   UN = UNTESTABLE(Joint fused/limb amputated)     5b.  2 Motor right arm:     0 = No drift; limb holds 90 (or 45) degrees for full 10 seconds.   1 = Drift  2 = Some effort against gravity  3 = No effort against gravity  4 = No movement.   UN = UNTESTABLE(Joint fused/limb amputated)     6a. 4 Motor left leg:     0 = No drift; leg holds 30-degree position for full 5 seconds.   1 = Drift  2 = Some effort against gravity  3 = No  effort against gravity  4 = No movement.   UN = UNTESTABLE(Joint fused/lim amputated)       6b.  3 Motor right leg:      0 = No drift; leg holds 30-degree position for full 5 seconds.   1 = Drift  2 = Some effort against gravity  3 = No effort against gravity  4 = No movement.   UN = UNTESTABLE(Joint fused/lim amputated)     7. 0 Limb Ataxia:     0 = No Ataxia  1 = Present in 1 limb.   2 = Present in 2 limbs.         8.  0 Sensory:  Use pinprick to test arms/legs/trunk/face,compare side to side.   0 = Normal; no sensory loss.  1 = Mild-to-moderate sensory loss  2 = Severe to total sensory loss     9. 0 Best Language:   Describe picture/name items/read sentences.   0 = No aphasia; normal.  1 = Mild-to-moderate aphasia  2 = Severe aphasia  3 = Mute       10. UN Dysarthria:   (Read several words)     0 = Normal Articulation  1 = Mild-to-moderate slurring  2 = near unintelligible or unalbe to speak  UN = Intubated or other physical barrier   11.  1 Extinction and Inattention:     0 = Normal  1 = Inattention or extinction to bilateral simultaneous stimulation in one       sensory modality  2 = Severe Hemi-inattention or Hem-attention to more than one modality            Total SCORE:  23

## 2024-01-30 NOTE — Pharmacy (Signed)
 [facility] / Department of Pharmaceutical Services  Therapeutic Drug Monitoring: Vancomycin   01/30/2024      Patient name: Bridget [name], Bridget [name]  Date of Birth:  [birth year]    Actual Weight:  Weight: 72.6 kg (160 lb 0.9 oz) (01/30/24 0600)     BMI:  BMI (Calculated): 28.41 (01/30/24 0600)      Date RPh Current regimen (including mg/kg) Indication &  Organism AUC or trough based dosing Target Levels^ SCr (mg/dL) CrCl* (mL/min) Infectious Laboratory Markers (as applicable)   Measured level(s)   (mcg/mL) Calculated AUC (if AUC based monitoring) Plan & predicted AUC/trough if initial dosing (including when levels are due) Comments   01/30/24 JAS 1 gm q24h Empiric/unclear trough 10-15 1.06 35.7 WBC:  Procal:  CRP:   Estimated trough of 13.4. Steady state by 5th dose.                                                                                                    ^Target levels depends on dosing and monitoring method, AUC vs. trough based. For AUC based dosing units are mg*h/L. For trough based dosing units are mcg/mL.     *Creatinine clearance is estimated by using the Cockcroft-Gault equation for adult patients and the Arlana graft for pediatric patients.    The decision to discontinue vancomycin  therapy will be determined by the primary service.  Please contact the pharmacist with any questions regarding this patient's medication regimen.

## 2024-01-30 NOTE — Discharge Summary (Signed)
 [facility]  DISCHARGE SUMMARY      PATIENT NAME:  Bridget [name], Bridget [name]  MRN:  [id number]  DOB:  [birth year]      INPATIENT ADMISSION DATE: 01/28/2024  DISCHARGE DATE: 01/30/2024     ADMITTING PHYSICIAN:  Dr. Darrick  DISCHARGING PHYSICIAN : Dr. Olevia  PRIMARY CARE PHYSICIAN: Deward Squibb, MD     DISCHARGE DIAGNOSIS:  Acute Ischemic Stroke  Right MCA Large Vessel Occlusion  Chronic Right ICA Stenosis  Hx prior CVA (Right Cerebellum)  Postoperative Fever   Left Hip Fracture s/p Left Hip Arthroplasty on 1/3  Left 2nd Toe Fracture  Mechanical Fall  Osteoporosis  Mild Troponin Elevation  CKD III  Neurocognitive Impairment  Mood Disorder  HTN  HLD  Hypothyroidism    HOSPITAL COURSE:  HPI:  (Copied from Admission HPI):  This is an 89 year old female with a past medical history significant for hypertension, hyperlipidemia, CVA, who presents to the emergency department secondary to left hip pain after falling.  Patient states she was at [employer] and when she went to get out of the car she slipped and fell and subsequently was unable to get up.  She denies hitting her head.  She denies loss of consciousness.  She complained of left foot and knee pain in ED however denied this on exam.  She denies chest pain, shortness for breath, dizziness, lightheadedness, nausea, vomiting, diarrhea, urinary symptoms, fever, chills, and abdominal pain.  She is alert and oriented and able to answer all questions appropriately.     Blood work performed in ED revealed a WBC of 13.3, with remaining labs unremarkable.  Chest x-ray negative for acute cardiopulmonary process.  Left femur x-ray notes displaced left femoral neck fracture.  Left foot x-ray notes suspected nondisplaced fracture of the distal 2nd proximal phalanx, extending to the PIP joint.  Left hip x-ray notes displaced left femoral neck fracture.  Left knee x-ray notes degenerative changes without acute bony abnormality.  CT brain and CT cervical spine negative for acute process.  CT  chest/abdomen/pelvis notes mildly impacted subcapital left femoral neck fracture.  Additionally there is a small amount of fluid seen in the endometrial cavity, recommend nonemergent pelvic ultrasound by radiologist.  Scattered thyroid  nodules noted as well with recommended outpatient ultrasound of the thyroid  if indicated.  EKG revealed sinus rhythm with first-degree AV block with occasional PVCs.  Right bundle branch block.  Left anterior fascicular block.  Orthopedic was consulted in ED and seen patient at bedside.    Assessment and Plan:  This is an 62 yr F patient who presented to the ER on 1/2 for chief complaint mechanical fall with left hip pain. Significant PMH Neurocognitive Impairment, prior CVA (right cerebellum) with right ICA stenosis, HTN, HLD, Hypothyroidism, Osteoporosis, Mood Disorder. Patient found to have Left Hip Fracture s/p Left Hip Hemiarthroplasty on 1/3. Patient unfortunately developed postoperative fever 101F and found with decreased level of consciousness and concern for left hemiparesis with stroke alert called at around 0630. Last known normal 0430. Patient found to have right MCA large vessel occlusion and chronic right ICA stenosis. Teleneurology and IR were contacted and patient will be flown to [facility] for emergent IR procedure. Patient has been stabilized for transport to the best of our abilities. Patient's husband is present at bedside and updated.    Acute Ischemic Stroke  Hx prior CVA (Right Cerebellum)  Right MCA Large Vessel Occlusion  Chronic Right ICA Stenosis  -patient is with decreased level of consciousness, left  sided hemiparesis at the time of the stroke alert  -CT Brain no acute process  -CTA Head/Neck showed right MCA large vessel occlusion and chronic right ICA stenosis  -will need additional stroke work-up ordered MRI Brain, Holter Monitor, Lipid Panel, Ha1c. (Patient has had negative shunt study in the past)  -Teleneurology and IR consulted. Patient will be flown  emergently to [facility] for IR procedure  -strict NPO until swallow evaluation  -initial NIH is 20. Continue NIH scoring  -allow permissive HTN. IV fluids started  -continue aspirin  and Plavix , statin  -hold DVT prophylaxis  -PT/OT/Speech consulted  -monitor on telemetry    Postoperative Fever  -possibly postop pneumonia however left hip incision also needs close monitoring   -Tmax 101.8  -WBC 13.3. Inflammatory markers not obtained  -Left hip incision does appear erythematous and warm surrounding postoperative dressing  -UA is unremarkable  -Blood cultures pending  -CTA Head/Neck suggestive of some ground glass opacities bilateral lung apices. Will need CXR for further evaluation  -started IV fluid resuscitation as above  -IV Vanc and Zosyn   -recommend orthopedics consult to follow patient at [facility]    Left Hip Fracture s/p Left Hip Arthroplasty on 1/3  Left 2nd Toe Fracture  Mechanical Fall  Osteoporosis  -patient has been having some increased left knee pain in the past few days and then sustained a fall when her knee gave out per husband  -XR left foot showed nondisplaced fracture distal second proximal phalanx, extending to the PIP joint  -XR Left Hip displaced femoral neck fracture  -XR left knee showed degenerative changes  -keep postoperative dressing c/d/i  -WBAT with FWW  -DVT Prophylaxis: Lovenox  now on hold  -GI Prophylaxis: Pepcid   -Bowel Regimen: Miralax   -Pain Control: Tylenol , Norco PRN  -hold home Risedonate. Will need further management osteoporosis outpatient  -Ortho consulted and following  -PT/OT consult    Mild Troponin Elevation  -most likely demand ischemia 2/2 above  -troponin elevated 120.6. trend troponins every 4 hrs until peak  -serial EKG showed bifascicular block  -avoid AV nodal blockade    CKD III  -Cr appears at baseline 0.9-1.1  -UA small blood and 30 protein  -monitor I&O. Avoid nephrotoxins. Renal adjust medications    Chronic Medical Conditions:  Neurocognitive Impairment and Mood  Disorder: Continue Aricept  and Zoloft . Avoid Polypharmacy. Fall, Aspiration, Delirium Precautions. PT/OT/Speech consults.  HTN: Hold Amlodipine  and Lisinopril /HCTZ for permissive HTN  HLD: Continue Crestor   Hypothyroidism: continue Levothyroxine     Incidental CT Imaging Findings:  Hepatic Steatosis  Renal Cyst  Free Fluid in Endometrial Cavity  Thyroid  Nodules  Diverticulosis    DVT Prophylaxis: SCDs  Full Code  Disposition: [facility]    Base (Admission) Weight: 78.5 kg (173 lb)   Weight: 72.6 kg (160 lb 0.9 oz)  Body mass index is 28.35 kg/m.  Wt Readings from Last 5 Encounters:   01/30/24 72.6 kg (160 lb 0.9 oz)   12/07/23 78.5 kg (173 lb)   12/08/22 78.5 kg (173 lb)   10/22/22 78.5 kg (173 lb 1 oz)   10/06/21 79.9 kg (176 lb 2.4 oz)       Physical Exam:  Blood pressure (!) 123/59, pulse 94, temperature (!) 38.8 C (101.8 F), resp. rate 18, height 1.6 m (5' 3), weight 72.6 kg (160 lb 0.9 oz), SpO2 97%.   General: 89 y.o. female, appears stated age, in no acute distress  Eyes: PERRLA. Clear conjunctivae.  HENT: NCAT. Oral and nasal mucosa moist without  erythema or exudate.   Neck: Soft and non-tender. No lymphadenopathy.  CV: Regular rate and rhythm without murmurs  Lungs: Coarse to auscultation bilaterally. No wheezes, crackles or rhonchi. Comfortable work of breathing on NC  Abdomen: Soft, non-tender, non-distended, bowel sounds present  Neuro: Arouses to light touch, does not verbalize during my exam or follow commands, mixed motor exam she is moving all her extremities slightly  Musculoskeletal: Left hip incision has some erythema and warmth outside of the bandage, no drainage  Extremities: No peripheral edema  Skin:Warm and dry. No visible rashes or lesions.   Psych: Behavior, speech and affect are appropriate.     Labs:  Results for orders placed or performed during the hospital encounter of 01/28/24 (from the past 24 hours)   URINALYSIS, MACROSCOPIC   Result Value Ref Range    COLOR Yellow Yellow, Colorless     APPEARANCE Clear Clear    SPECIFIC GRAVITY 1.033 (H) 1.005 - 1.030    PH 6.0 5.0 - 8.0    PROTEIN 30 (A) Negative mg/dL    GLUCOSE Negative Negative mg/dL    KETONES Negative Negative mg/dL    UROBILINOGEN Negative Negative mg/dL    BILIRUBIN Negative Negative mg/dL    BLOOD Small (A) Negative mg/dL    NITRITE Negative Negative    LEUKOCYTES Negative Negative WBCs/uL   URINALYSIS, MICROSCOPIC   Result Value Ref Range    RBCS 25.0 (H) <6.0 /hpf    WBCS 10.0 <11.0 /hpf    BACTERIA Occasional or less Occasional or less /hpf    MUCOUS Light Light /hpf    HYALINE CASTS 8.0 (H) <4.0 /lpf    SQUAMOUS EPITHELIAL Occasional or less Occasional or less /hpf   POC BLOOD GLUCOSE (RESULTS)   Result Value Ref Range    GLUCOSE, POC 116 (H) 70 - 100 mg/dl   BLOOD GAS W/ CO-OX, LYTES, LACTATE REFLEX Arterial   Result Value Ref Range    %FIO2 (ARTERIAL) 32 %    PH (ARTERIAL) 7.41 7.35 - 7.45    PCO2 (ARTERIAL) 45 35 - 45 mm/Hg    PO2 (ARTERIAL) 81 (L) 83 - 108 mm/Hg    BICARBONATE (ARTERIAL) 27.5 21.0 - 28.0 mmol/L    BASE EXCESS (ARTERIAL) 3.3 (H) 0.0 - 3.0 mmol/L    PAO2/FIO2 RATIO 253     O2 SATURATION (ARTERIAL) 96.0 94.0 - 98.0 %    HEMOGLOBIN 12.3 12.0 - 18.0 g/dL    HEMATOCRITRT 37 37 - 50 %    OXYHEMOGLOBIN 95.2 (H) 90.0 - 95.0 %    CARBOXYHEMOGLOBIN 1.8 <=3.0 %    MET-HEMOGLOBIN 0.8 <=1.5 %    O2CT 16.5 %    SODIUM 132 (L) 136 - 145 mmol/L    HEMOLYSIS None None, Mild    WHOLE BLOOD POTASSIUM 4.3 3.5 - 5.1 mmol/L    CHLORIDE 108 (H) 98 - 107 mmol/L    IONIZED CALCIUM  1.30 1.15 - 1.33 mmol/L    GLUCOSE 142 (H) 65 - 125 mg/dL    LACTATE 0.7 <=8.0 mmol/L   CBC WITH DIFF   Result Value Ref Range    WBC 12.3 (H) 3.7 - 11.0 x103/uL    RBC 4.15 3.85 - 5.22 x106/uL    HGB 11.5 11.5 - 16.0 g/dL    HCT 63.6 65.1 - 53.9 %    MCV 87.5 78.0 - 100.0 fL    MCH 27.7 26.0 - 32.0 pg    MCHC 31.7 31.0 - 35.5 g/dL  RDW-CV 14.4 11.5 - 15.5 %    PLATELETS 171 150 - 400 x103/uL    MPV 10.4 8.7 - 12.5 fL    NEUTROPHIL % 83.2 %    LYMPHOCYTE  % 6.7 %    MONOCYTE % 9.1 %    EOSINOPHIL % 0.1 %    BASOPHIL % 0.2 %    NEUTROPHIL # 10.21 (H) 1.50 - 7.70 x103/uL    LYMPHOCYTE # 0.82 (L) 1.00 - 4.80 x103/uL    MONOCYTE # 1.12 (H) 0.20 - 1.10 x103/uL    EOSINOPHIL # <0.10 <=0.50 x103/uL    BASOPHIL # <0.10 <=0.20 x103/uL    IMMATURE GRANULOCYTE % 0.7 0.0 - 1.0 %    IMMATURE GRANULOCYTE # <0.10 <0.10 x103/uL   TROPONIN-I   Result Value Ref Range    TROPONIN-I HS 120.6 (HH) <=14.0 ng/L   BASIC METABOLIC PANEL   Result Value Ref Range    SODIUM 137 136 - 145 mmol/L    POTASSIUM 4.3 3.5 - 5.1 mmol/L    CHLORIDE 106 96 - 111 mmol/L    CO2 TOTAL 25 23 - 31 mmol/L    ANION GAP 6 4 - 13 mmol/L    CALCIUM  9.2 8.6 - 10.3 mg/dL    GLUCOSE 873 (H) 65 - 125 mg/dL    BUN 20 8 - 25 mg/dL    CREATININE 8.93 (H) 0.60 - 1.05 mg/dL    eGFRcr - FEMALE 51 (L) >=60 mL/min/1.66m2    BUN/CREA RATIO 19 6 - 22   ECG 12 LEAD   Result Value Ref Range    Ventricular rate 95 BPM    Atrial Rate 95 BPM    PR Interval 192 ms    QRS Duration 146 ms    QT Interval 360 ms    QTC Calculation 452 ms    Calculated P Axis 44 degrees    Calculated R Axis -68 degrees    Calculated T Axis 9 degrees     Diagnostic Studies:  Results for orders placed or performed during the hospital encounter of 01/28/24   XR FEMUR LEFT     Status: None    Narrative    Malaya MAE Daponte  Female, 89 years old.    XR FEMUR LEFT- 2 VIEWS performed on 01/28/2024 6:22 PM.    REASON FOR EXAM:  fall    TECHNIQUE: 2 views/5 images submitted for interpretation.    COMPARISON: CT urogram performed 08/14/2018.    FINDINGS:  There is a displaced fracture of the left femoral neck. The femoral head appears to remain seated within the left acetabulum. Adjacent surgical clips project over the left inferior pubic ramus. No additional fracture is identified in the femur. Prominent degenerative changes are partially seen at the level of the knee.      Impression    Displaced left femoral neck fracture.        Radiologist location ID:  [id number]     XR KNEE LEFT 4 OR MORE VIEWS     Status: None    Narrative    Tashanti MAE Fisk  Female, 89 years old.    XR KNEE LEFT 4 OR MORE VIEWS performed on 01/28/2024 6:21 PM.    REASON FOR EXAM:  fall    TECHNIQUE: 4 views/4 images submitted for interpretation.    COMPARISON: Radiograph of the femur performed the same day.    FINDINGS:  There is grossly preserved alignment of the left knee. Tricompartmental degenerative arthrosis is  present, with joint space narrowing greatest in the medial tibiofemoral compartment. No definite fracture is identified. Sclerotic density in the distal femur is likely a chronic finding. A prominent osteophyte is seen arising from the superior patella. There is no joint effusion or significant overlying soft tissue swelling.      Impression    Degenerative changes of the knee without acute bony abnormality.        Radiologist location ID: [id number]     XR CHEST AP     Status: None    Narrative    Iliani MAE Jimerson  Female, 89 years old.    XR CHEST AP performed on 01/28/2024 6:23 PM.    REASON FOR EXAM:  lung eval, fall     TECHNIQUE: 1 views/1 images submitted for interpretation.    COMPARISON: 10/22/2022    FINDINGS:    Cardiac silhouette and mediastinal structures are unremarkable and stable in appearance. The lungs are clear and expanded with no consolidations and no effusions. Unremarkable bowel gas pattern. Degenerative change in the spine.      Impression    Clear lungs, no cardiomegaly and the lung volumes are normal.        Radiologist location ID: [id number]     XR FOOT LEFT 2 VIEW     Status: None    Narrative    Karne MAE Castelluccio  Female, 89 years old.    XR FOOT LEFT 2 VIEWS performed on 01/28/2024 6:22 PM.    REASON FOR EXAM:  fall    TECHNIQUE: 2 views/2 images submitted for interpretation.    COMPARISON: Radiographs of the left lower extremity performed the same day.    FINDINGS:  Tarsal alignment appears to be grossly preserved within the limits of this  nonweightbearing exam. Degenerative changes are seen throughout. There is hallux valgus with corresponding moderate degenerative arthrosis of the first MTP joint. Irregular lucency is seen traversing the distal second proximal phalanx, involving the PIP joint, concerning for fracture. No additional fracture is identified. A calcaneal bone spur is identified.      Impression    Suspect nondisplaced fracture at the distal second proximal phalanx, extending to the PIP joint.        Radiologist location ID: [id number]     XR HIP LEFT W PELVIS 2-3 VIEWS     Status: None    Narrative    Arica MAE Hocutt  Female, 89 years old.    XR HIP LEFT W PELVIS 2-3 VIEWS performed on 01/28/2024 7:16 PM.    REASON FOR EXAM:  fall, hip pain, assess fx     TECHNIQUE: 2 views/3 images submitted for interpretation.    COMPARISON: Radiograph of the femur performed the same day, CT urogram performed 08/04/2018.    FINDINGS:  There is an acute displaced fracture of the left femoral neck, with slight superior and lateral subluxation of the proximal femur. Additional fracture of the greater trochanter cannot be excluded. The femoral head remains seated in the acetabulum with moderate degenerative arthrosis. There is also moderate degenerative arthrosis of the right hip. There is no widening of the pubic symphysis or sacroiliac joints, both of which demonstrate degenerative change. There is limited evaluation of the sacrum due to overlying bowel gas. Surgical clips project over the bilateral inferior pubic rami. Degenerative changes are also seen in the spine.      Impression    Displaced left femoral neck fracture.        Radiologist location ID:  [id number]     CT BRAIN WO IV CONTRAST     Status: None    Narrative    Janit MAE Nix  Female, 89 years old.    CT BRAIN WO IV CONTRAST performed on 01/28/2024 8:49 PM    INDICATION: 89 years old Female fall    TECHNIQUE: CT of the head acquired without contrast administration    RADIATION  DOSE: 2010.20 mGy.cm    COMPARISON: CT head and neck October 23, 2022    FINDINGS:     Brain: Periventricular lucency could relate to chronic microvascular ischemic disease. No acute territorial infarction or intraparenchymal hemorrhage. No mass effect. No midline shift.    Ventricles/extra-axial spaces: No hydrocephalus. No extra-axial hemorrhage or fluid collection.    Other findings: None.        Impression    Chronic changes without acute intracranial abnormality.            Radiologist location ID: [id number]     CT CERVICAL SPINE WO IV CONTRAST     Status: None    Narrative    Emi MAE Lanum  Female, 89 years old.    CT CERVICAL SPINE WO IV CONTRAST performed on 01/28/2024 8:49 PM    INDICATION: 89 years old Female fall    TECHNIQUE: CT of the cervical spine acquired without contrast.    RADIATION DOSE: 2010.20 mGy.cm    COMPARISON: CTA head and neck October 23, 2022    FINDINGS:   Bones are demineralized limiting evaluation for acute fracture.  Craniocervical junction: No acute fracture or dissociation.    Subaxial alignment and vertebrae: No distraction or translation injury. Vertebral bodies and posterior elements are intact.    Discs, spinal canal, and neural foramina: Multilevel degenerative disc changes.    Perivertebral/neck soft tissues: No acute abnormality.    Other findings including visualized chest: Left inferior thyroid  nodule measuring 1.5 cm.        Impression    No acute fracture or traumatic subluxation.        Radiologist location ID: [id number]     CT CHEST ABDOMEN PELVIS W IV CONTRAST     Status: None    Narrative    Andi MAE Shon  Female, 89 years old.    CT CHEST ABDOMEN PELVIS W IV CONTRAST performed on 01/28/2024 8:49 PM.    REASON FOR EXAM:  fall, fall    RADIATION DOSE: 2010.20 mGy.cm    CONTRAST: 100 ml's of Isovue  370    TECHNIQUE: Contiguous axially collimated images were obtained from the lung apices through the proximal femurs. Postprocessing of the original data  set was performed.  Coronal reformatted images were prepared on a separate workstation and reviewed for anatomic correlation. This CT exam was performed using one or more of the following dose reduction techniques: Automated exposure control, adjustment of the mA and/or kV according to patient size, or use of iterative reconstruction technique.    COMPARISON: 08/04/2018    FINDINGS:    CHEST:    HEART/MEDIASTINUM: Heart is normal in size. No pericardial effusion. No pulmonary embolus, the main pulmonary artery is normal in size. Moderate atherosclerotic calcifications throughout.    LYMPH NODES/ESOPHAGUS: The esophagus is normal and there is no lymphadenopathy. No axillary lymphadenopathy.    LUNGS/PLEURA: Lungs are clear without consolidation. No pulmonary nodules or masses.    SOFT TISSUE/BONES: The thyroid  is heterogeneous with scattered nodules, recommend nonemergent thyroid  ultrasound is an outpatient.    ABDOMEN/PELVIS:  VASCULATURE: Moderate atherosclerotic disease throughout the IVC is normal.    LIVER: The liver is fatty infiltrated, normal in size measuring 15 cm cranial caudal dimension. The portal and hepatic veins are patent.    BILIARY SYSTEM/GALLBLADDER: The gallbladder is mildly distended with no calcified stones, no secondary signs of acute cholecystitis. No intra or extrahepatic biliary dilatation.    SPLEEN: The spleen is normal in size measuring 12.5 cm with no splenic lesions. The splenic vein is patent.    PANCREAS: Pancreas is normal with no ductal dilatation.    ADRENALS: The right adrenal is normal and there is mild symmetric thickening in the left adrenal with no discrete mass.    KIDNEY/URETERS/URINARY BLADDER: Bilateral renal cysts consistent with Bosniak 1/Bosniak 2 cyst. There is no hydronephrosis or hydroureter and there is no calcified stones. There is a Foley catheter in a decompressed urinary bladder with a small amount of air in the bladder.    REPRODUCTIVE ORGANS: The uterus is  atrophied with low density in the area of the endometrial stripe. No adnexal abnormalities.    GI/BOWEL: The distal esophagus, stomach, duodenum, small bowel loops are normal. The terminal ileum and appendix are normal. There is a moderate stool load in the colon with fairly severe diverticular disease in the sigmoid colon, no secondary signs of acute diverticulitis.    PERITONEAL CAVITY/LYMPH NODES:  No retroperitoneal lymphadenopathy, no free fluid or free air in the abdomen or pelvis.    SOFT TISSUES/BONES: No soft tissue abnormalities. There is a left mildly impacted subcapital femoral neck fracture. There is degenerative change in the spine and there is facet joint arthropathy and moderate degenerative disc disease throughout the spine. Degenerative change in the visualized cervical spine.      Impression    Mildly impacted subcapital left femoral neck fracture.    No other intrathoracic or intra-abdominal traumatic injuries are seen.    Hepatic steatosis.    Renal cyst, Bosniak 1/Bosniak 2 cyst.    There is a small amount of fluid seen in the endometrial cavity, recommend nonemergent pelvic ultrasound.    Scattered thyroid  nodules, if further workup is indicated outpatient ultrasound of the thyroid  can be performed.    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                      Radiologist location ID: [id number]     XR LOW PELVIS W LEFT LATERAL HIP     Status: None    Narrative    Mikyla MAE Trego  Female, 89 years old.    XR LOW PELVIS W LEFT LATERAL HIP performed on 01/29/2024 2:59 PM.    REASON FOR EXAM:  postop pacu, Post Lt Hip Sx     TECHNIQUE: 2 views/2 images submitted for interpretation.    COMPARISON: CT chest abdomen pelvis performed 01/28/2024.    FINDINGS:  Interval postoperative changes are identified from left hip arthroplasty. Alignment appears appropriate. There is expected postoperative subcutaneous air and soft tissue swelling, with a  surgical drain and skin staples in place. Postoperative intraosseous gas is also suspected in the femur. Moderate degenerative arthrosis is noted in the right hip. Degenerative changes are seen throughout the pelvis.      Impression    Expected acute postoperative changes from left hip arthroplasty.        Radiologist location ID: [id number]     CT BRAIN WO IV CONTRAST - POSSIBLE STROKE  Status: None    Narrative    Haelie MAE Souffrant  Female, 89 years old.    CT BRAIN WO IV CONTRAST - POSSIBLE STROKE performed on 01/30/2024 6:57 AM    INDICATION: 89 years old Female Possible Stroke    TECHNIQUE: CT of the head acquired without contrast administration    RADIATION DOSE: 2296.80 mGy.cm    Technique:Head CT without intravenous contrast followed by CTA imaging of the circle of Willis from the skull base to the vertex with intravenous contrast.  3D reconstructions.      COMPARISON:  CT head 01/28/2024.    FINDINGS:  CT Brain:  There is no acute intracranial hemorrhage or extra-axial fluid collection. Normal gray-white differentiation is maintained, without CT evidence of an acute territorial infarct. Periventricular and subcortical hypodensities are likely attributable to chronic microvascular disease. Mild diffuse cerebral volume loss is present. Calcifications are seen within the carotid siphons.    Ventricles and basal cisterns are normal in caliber. There is no mass effect or midline shift.    Intraorbital contents are unremarkable. The paranasal sinuses and mastoid air cells are well-aerated. The calvarium is normal.    CT ANGIOGRAM HEAD - FINDINGS:  There is normal enhancement of the cerebral arteries and major branch vessels. No mass or aneurysm is seen. There is severe stenosis of the cavernous right ICA.SABRAThe right MCA demonstrates diminished flow secondary to diminutive size of the right ICA. There is severely asymmetrically diminished perfusion within the right MCA territory. Anterior cerebral arteries are  unremarkable bilaterally. Bilateral posterior communicating arteries are the native. Bilateral intracranial vertebral arteries are patent as are the posterior inferior cerebellar arteries. Basilar artery and bilateral posterior cerebral arteries are patent.    CT ANGIOGRAM NECK - FINDINGS:  The right ICA is diminutive.. There is normal contrast enhancement of the vasculature. Vertebral arteries are patent with no evidence of dissection, stenosis or occlusion.  There are groundglass opacities within the bilateral lung apices.        Impression    1. Diminutive right ICA with severely asymmetrically diminished flow within the right MCA territory. 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 Recommend MRI for further evaluation.  2. Groundglass opacities within the bilateral lung apices. This may represent developing infectious or inflammatory process.    Radiologist location ID: [id number]    Neuroradiology revision: There is severe stenosis of the right ICA origin. The remaining right ICA is diminutive in caliber. There is multifocal thrombus with severe narrowing in the right cavernous and supraclinoid ICA. There is multifocal thrombus in the right MCA M1 segment with near complete occlusion. There is occlusion at the right ACA origin with prompt reconstitution of the right A1 segment. There is mismatch perfusion defect in the right MCA territory.    A Critical Red actionable finding has been sent via the [employer] Actionable Findings application on 01/30/2024 7:39 AM, Message ID [id number]. Receipt of this communication will be communicated to [facility] RADIOLOGY STAFF or responsible provider and will be documented in [employer] Actionable Findings System upon receiving the acknowledgement.      Radiologist location ID: [id number]     CT STROKE PROTOCOL (CTA HEAD/NECK WO/W)     Status: Abnormal    Narrative    Lazariah MAE Ainley  Female, 89 years old.    CT BRAIN WO IV CONTRAST - POSSIBLE STROKE performed on  01/30/2024 6:57 AM    INDICATION: 89 years old Female Possible Stroke    TECHNIQUE: CT of the head  acquired without contrast administration    RADIATION DOSE: 2296.80 mGy.cm    Technique:Head CT without intravenous contrast followed by CTA imaging of the circle of Willis from the skull base to the vertex with intravenous contrast.  3D reconstructions.      COMPARISON:  CT head 01/28/2024.    FINDINGS:  CT Brain:  There is no acute intracranial hemorrhage or extra-axial fluid collection. Normal gray-white differentiation is maintained, without CT evidence of an acute territorial infarct. Periventricular and subcortical hypodensities are likely attributable to chronic microvascular disease. Mild diffuse cerebral volume loss is present. Calcifications are seen within the carotid siphons.    Ventricles and basal cisterns are normal in caliber. There is no mass effect or midline shift.    Intraorbital contents are unremarkable. The paranasal sinuses and mastoid air cells are well-aerated. The calvarium is normal.    CT ANGIOGRAM HEAD - FINDINGS:  There is normal enhancement of the cerebral arteries and major branch vessels. No mass or aneurysm is seen. There is severe stenosis of the cavernous right ICA.SABRAThe right MCA demonstrates diminished flow secondary to diminutive size of the right ICA. There is severely asymmetrically diminished perfusion within the right MCA territory. Anterior cerebral arteries are unremarkable bilaterally. Bilateral posterior communicating arteries are the native. Bilateral intracranial vertebral arteries are patent as are the posterior inferior cerebellar arteries. Basilar artery and bilateral posterior cerebral arteries are patent.    CT ANGIOGRAM NECK - FINDINGS:  The right ICA is diminutive.. There is normal contrast enhancement of the vasculature. Vertebral arteries are patent with no evidence of dissection, stenosis or occlusion.  There are groundglass opacities within the bilateral lung  apices.        Impression    1. Diminutive right ICA with severely asymmetrically diminished flow within the right MCA territory. 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 Recommend MRI for further evaluation.  2. Groundglass opacities within the bilateral lung apices. This may represent developing infectious or inflammatory process.    Radiologist location ID: [id number]    Neuroradiology revision: There is severe stenosis of the right ICA origin. The remaining right ICA is diminutive in caliber. There is multifocal thrombus with severe narrowing in the right cavernous and supraclinoid ICA. There is multifocal thrombus in the right MCA M1 segment with near complete occlusion. There is occlusion at the right ACA origin with prompt reconstitution of the right A1 segment. There is mismatch perfusion defect in the right MCA territory.    A Critical Red actionable finding has been sent via the [employer] Actionable Findings application on 01/30/2024 7:39 AM, Message ID [id number]. Receipt of this communication will be communicated to [facility] RADIOLOGY STAFF or responsible provider and will be documented in [employer] Actionable Findings System upon receiving the acknowledgement.      Radiologist location ID: [id number]            DISCHARGE MEDICATIONS:     Current Discharge Medication List        CONTINUE these medications - NO CHANGES were made during your visit.        Details   amLODIPine  5 mg Tablet  Commonly known as: NORVASC    TAKE 1 TABLET BY MOUTH EVERY DAY. STOP TAKING THE AMLODIPINE  2.5MG   Refills: 0     aspirin  81 mg Tablet, Chewable   81 mg, Oral, Daily  Refills: 0     clopidogreL  75 mg Tablet  Commonly known as: PLAVIX    75 mg, Oral, Daily  Qty: 30 Tablet  Refills: 0     donepeziL  5 mg Tablet  Commonly known as: ARICEPT    5 mg, Oral, NIGHTLY  Refills: 0     levothyroxine  75 mcg Tablet  Commonly known as: SYNTHROID    Take 1 Tablet (75 mcg total) by mouth Every morning  Refills: 0      lisinopriL -hydrochlorothiazide  20-12.5 mg Tablet  Commonly known as: ZESTORETIC    1 Tablet, Oral, Daily  Refills: 0     MULTI-VITAMIN ORAL   Oral  Refills: 0     risedronate 150 mg Tablet  Commonly known as: ACTONEL   150 mg, Oral, PER INSTRUCTIONS, PLEASE SEE ATTACHED FOR DETAILED DIRECTIONS  Refills: 0     rosuvastatin  20 mg Tablet  Commonly known as: CRESTOR    20 mg, Oral, EVERY EVENING  Qty: 30 Tablet  Refills: 0     sertraline  25 mg Tablet  Commonly known as: ZOLOFT    25 mg, Oral, Daily  Refills: 0     Vitamin B-12 1,000 mcg Tablet Sustained Release  Generic drug: cyanocobalamin    1,000 mcg, Oral, Daily  Refills: 0              DISCHARGE INSTRUCTIONS:   No discharge procedures on file.        CONDITION ON DISCHARGE: Critical    DISCHARGE DISPOSITION:  [facility]    Total Time Spent: 60 mins    Wanda Nyhan, MD

## 2024-01-30 NOTE — Care Plan (Signed)
 Shift Summary  Norepinephrine  rate was increased twice during the shift in response to persistently low MAP and BP.   Neurological assessments revealed severe deficits in consciousness, motor function, and communication, with stable SOFA score.   Family and support system were actively involved and updated on the plan of care.   Patient remained intubated and NPO, with significant impairment in mobility and functional ability.   Overall, patient required frequent monitoring and support for cerebral perfusion, safety, and coping needs.     Optimal Coping: Family and support system were updated on the plan of care, caregiver stress was acknowledged, and a calm environment was promoted; patient was withdrawn and unable to verbalize anxieties or participate in bedside report. Care was explained and choices provided to foster trust and involvement in care.     Effective Bowel Elimination: No bowel movement documented during the shift; last recorded bowel movement was on 01/28/24.     Optimal Cerebral Tissue Perfusion: Blood pressure and MAP remained low throughout the shift, with ongoing monitoring and norepinephrine  rate increases to support perfusion. SOFA score remained stable at 8 during the shift.     Optimal Cognitive Function: Level of consciousness was obtunded, with repeated stimulation required; Glasgow Coma Scale score was 8, and NIH Stroke Scale score was 20, with severe deficits in orientation, language, and sensory function.     Improved Communication Skills: Communication was limited due to intubation and global aphasia, with no usable speech or auditory comprehension.     Optimal Functional Ability: Mobility was significantly impaired, with very limited active ROM and muscle strength in all extremities; patient required team lift and assistive devices for movement in bed. Independence was encouraged, but patient was unable to stand, pivot, or walk without assistance.     Optimal Nutrition Intake: Patient  remained NPO throughout the shift.     Goal Outcome Evaluation:     Anxieties, Fears or Concerns: unable to voice (01/30/24 2000)  Individualized Care Needs: neuro checks (01/30/24 2000)  Patient-Specific Goals (Include Timeframe): extubate when ready (01/30/24 2000)  Plan of Care Reviewed With: son, spouse (01/30/24 2000)     Patient Progress: no change

## 2024-01-30 NOTE — Incoming ED Transfer Note (Signed)
 Made aware by [facility] in Radiology that patient would arrive to facility and go directly to IR 10. Contacted [facility] to request ETA when patient departs [facility].     Transferring facility: [facility]  ETA: [id number]  Mode of transportation: [facility]    Notified Radiology / APP of ETA.     EDT C.A. to roof for transport to IR 10.

## 2024-01-30 NOTE — Nurses Notes (Signed)
 [id number] primary nurse went to pass morning medications and the patient was showing increased signs of AMS, not responding to verbal commands. Charge nurse and MD called to bedside.   Obtained vitals and spiked a temp of 101.1 other VS were stable. Blood sugar obtained 116. RRT and [place]oke code called. Pt transferred to 3w. Husband notified by dayshift charge.    Armida Pin, LPN

## 2024-01-30 NOTE — Anesthesia OR-ICU Handoff (Cosign Needed)
 Anesthesia ICU Transfer of Care  Bridget [name] is a 89 y.o. ,female,     had Procedure(s):  IR STROKE THERAPY  performed  01/30/2024   Primary Service: Deatrice Hands, MD    Past Medical History:   Diagnosis Date    Arthritis     HTN (hypertension)       Allergy History as of 01/30/24        No Known Allergies                  I completed my ICU transfer of care/ Handoff to the ICU receiving personnel during which we discussed :  Access, Airway, All key and critical aspects of case discussed, Analgesia, Antibiotics, Expectation of post procedure, Fluids/Product, Gave opportunity for questions and acknowledgement of understanding, Labs and PMHx    anesthesia risks / alternatives discussed This is for handoff only . For doses and times see MAR      Reversal agents: Refused by NCCU team. Paralytic:rocuronium  Rhythm: sinus rhythm       Fluids: 900 mL      Airway: 2 attempts Plan for extubation: remain intubated Reason for remaining intubated: Unstable Lines:A-line   Infusions : Levophed                           Last OR Temp:        Airway:  EndoTracheal Tube Cuffed;Oral 7.0 22 Lip (Active)   Airway Secure Tape 01/30/24 0018

## 2024-01-30 NOTE — Brief Op Note (Signed)
 [facility]  [facility]   Interventional Neuroradiology Procedure Note    Patient Name: Bridget [name] [facility] Number: [id number]  Date of Birth: 12/09/1934  Date of Service: 01/30/2024    PROCEDURE: IR Stroke Thrombectomy  Attending: Shlomo, MD  Assistant: Alray, MD, Jolynn, MD  Anesthesia: GETA  Estimated Blood Loss: minimal  Complications: none  Access: right CFA 25F, left CFA 57F  Closure: right CFA 25F angioseal, left CFA 57F a-line  Heparin : none    Device: [place], VTK, Vert, Glide, Zoom, Viatrac 5 x 30mm balloon, Wallstent 8 x 29mm, Zoom 14 exchange, Freeclimb/Tenzing 70    Prelim findings: Right ICA/T occlusion 1 pass stroke thrombectomy with Right ICA stent/balloon angioplasty with TICI3 recanalization.    Plan/Disposition: NCCU  - Bedrest and RLE flat 2 hours  - Monitor RLE for groin hematoma  - Left 57F a-line in place  - SBP goal 140-110  - Loaded with Aggrastat  in IR suite  - Will need transition to DAPT therapy  - OP NIR follow up and CUS 1 month  - NCCU care     Toribio Alray, MD  Interventional Neuro-Radiology Fellow

## 2024-01-30 NOTE — Care Plan (Signed)
 Pt has been weaned to PSV with PS 10, peep 5 and 35% O2.  We will continue to wean toward extubation as tolerated.

## 2024-01-30 NOTE — H&P (Signed)
 INPATIENT NEUROCRITICAL HISTORY & PHYSICAL      Bridget [name] is a 89 y.o. female admitted to the NEURO CRITICAL CARE service for R MCA Stroke sp MT.    Date of Admission:  01/30/2024  Admission Source:  Transfer  Advance Directives:  None-Discussed  Hospice involvement prior to admission?  Not applicable     Subjective:     History Provided By:  history reviewed via medical record     History of Present Illness:   Per medical record This is an 89 year old female with a past medical history significant for hypertension, hyperlipidemia, CVA, who presents to the emergency department secondary to left hip pain after falling.  Patient states she was at [employer] and when she went to get out of the car she slipped and fell and subsequently was unable to get up.  She denies hitting her head.  She denies loss of consciousness.  She complained of left foot and knee pain in ED however denied this on exam.  She denies chest pain, shortness for breath, dizziness, lightheadedness, nausea, vomiting, diarrhea, urinary symptoms, fever, chills, and abdominal pain.  She is alert and oriented and able to answer all questions appropriately.     Blood work performed in ED revealed a WBC of 13.3, with remaining labs unremarkable.  Chest x-ray negative for acute cardiopulmonary process.  Left femur x-ray notes displaced left femoral neck fracture.  Left foot x-ray notes suspected nondisplaced fracture of the distal 2nd proximal phalanx, extending to the PIP joint.  Left hip x-ray notes displaced left femoral neck fracture.  Left knee x-ray notes degenerative changes without acute bony abnormality.  CT brain and CT cervical spine negative for acute process.  CT chest/abdomen/pelvis notes mildly impacted subcapital left femoral neck fracture.  Additionally there is a small amount of fluid seen in the endometrial cavity, recommend nonemergent pelvic ultrasound by radiologist.  Scattered thyroid  nodules noted as well with recommended  outpatient ultrasound of the thyroid  if indicated.  EKG revealed sinus rhythm with first-degree AV block with occasional PVCs.  Right bundle branch block.  Left anterior fascicular block.  Orthopedic was consulted in ED and seen patient at bedside. CT Stroke protocol at [facility] ED denotes R ICA stenosis and diminished flow in territory of R MCA. Pt to tsf to [facility] for MT attempt.     Active Hospital Problem List:  Principal Problem:    Acute ischemic right MCA stroke (CMS [facility])       Past Medical History Past Surgical History   Past Medical History:   Diagnosis Date    Arthritis     HTN (hypertension)         Past Surgical History:   Procedure Laterality Date    KNEE SURGERY               Family History Social History - Substance Use   Family History   Problem Relation Age of Onset    No Known Problems Mother     No Known Problems Father     No Known Problems Sister     No Known Problems Brother     No Known Problems Maternal Grandmother     No Known Problems Maternal Grandfather     No Known Problems Paternal Grandmother     No Known Problems Paternal Grandfather     No Known Problems Daughter     No Known Problems Son     No Known Problems Maternal Aunt  No Known Problems Maternal Uncle     No Known Problems Paternal Aunt     No Known Problems Paternal Uncle     No Known Problems Other     Breast Cancer Neg Hx     Cervical Cancer Neg Hx     Colon Cancer Neg Hx     Liver Cancer Neg Hx     Lung Cancer Neg Hx     Lymphoma Neg Hx     Melanoma Neg Hx     Ovarian Cancer Neg Hx     Pancreatic Cancer Neg Hx     Thyroid  Cancer Neg Hx     Uterine Cancer Neg Hx     Cancer Neg Hx     Prostate Cancer Neg Hx     Leukemia Neg Hx     Social History[1]   Medical, Surgical, Family, and Social History:  were reviewed and edited above.     Home Medications:   No outpatient medications have been marked as taking for the 01/30/24 encounter [facility] Encounter).        Allergies:  Allergies[2]     Review of Systems:  An additional 10 of 14  systems were reviewed and negative except as noted above.       Objective:     Most Recent Vital Signs:  Vital Signs Range Most Recent   Temperature Temp  Avg: 37.4 C (99.3 F)  Min: 36 C (96.8 F)  Max: 38.8 C (101.8 F)     Heart Rate Pulse  Avg: 93.6  Min: 75  Max: 107     Blood Pressure BP  Min: 123/59  Max: 162/108     Respiration Resp  Avg: 17.8  Min: 16  Max: 19     SpO2 SpO2  Avg: 96.5 %  Min: 91 %  Max: 100 % SpO2: 100 %     Physical Exam:      Gen: No apparent distress     HENT:  Normocephalic, atraumatic.  Sclera anicteric.  No oropharyngeal lesions.     Ophthalmic:  Pupils equal, briskly reactive to light.  Fundoscopic exam technically difficult to obtain, red reflex present.     Neck:  Carotids without bruits.     CV: Regular rate and rhythm.     Pulmonary: Intubated on ventilatory support.     Psych: Depressed due to medical condition.     Neurological Exam:      Mental Status Exam:     Level of Consciousness:  MSE depressed as defined below.      GCS - Eye:  1 - No eye opening      GCS - Verbal:  1T - intubated, unable to assess      GCS - Motor:  1 - Absent response   Memory:  unable to assess due to intubation   Attention:  Does not respond to any external stimuli.   Knowledge:  unable to assess due to intubation   Language:  Unable to assess due to intubation   Speech: Unable to assess due to intubation     Cranial Nerves:    CN2:  Unable to assess due to patient cooperation   CN3/4/6:  Unable to assess due to patient cooperation   CN5:  Symmetric at rest, but otherwise unable to assess due to patient cooperation.   CN7:  Unable to assess secondary to cooperation   CN8:  Hearing intact to finger rub bilaterally.   CN9/10:  Unable  to assess due to intubation and/or patient cooperation.   CN11:  shoulder shrug symmetric   CN12:  Unable to assess due to intubation and/or patient cooperation     Musculoskeletal:    Bulk/Tone/Tremor:  Normal muscle bulk and tone throughout. No tremor is  appreciated.  Pronator Drift:  Unable to assess due to patient cooperation.  Strength:   Right Arm:  Unable to test due to patient cooperation.   Left Arm:  Unable to test due to patient cooperation.   Right Leg:  Unable to test due to patient cooperation.   Left Leg:  Unable to test due to patient cooperation.     Sensation: No response to noxious stimuli.     Reflexes: Reflexes globally 2/4 in biceps, triceps, patellar, and ankle; no focal asymmetry.  Toes downgoing bilaterally.     Coordination:  Unable to assess due to patient cooperation.     Gait:  Unable to assess due to intubation and/or patient cooperation     Pertinent Labs:  Complete Blood Count  Recent Labs     01/30/24  0639   WBC 12.3*   HGB 11.5   PLTCNT 171    Metabolic Panel  Recent Labs     01/29/24  0706 01/30/24  0637 01/30/24  0639   SODIUM 141   < > 137   POTASSIUM 4.1  --  4.3   CHLORIDE 105   < > 106   CO2 29  --  25   BUN 12  --  20   CREATININE 0.86  --  1.06*   CALCIUM  9.0  --  9.2   MAGNESIUM 1.9  --   --     < > = values in this interval not displayed.      Liver Function Test  Recent Labs     01/28/24  1859   ALT 21   AST 27   TOTBILIRUBIN 0.6   ALBUMIN 3.7   ALKPHOS 86    Miscellaneous Labs  No results for input(s): LDLCHOL, HA1C, TROPONINI, TSH, VITB12 in the last 72 hours.      Pertinent Imaging Studies:  CT Stroke protocol 1/4: Diminutive right ICA with severely asymmetrically diminished flow within the right MCA territory. 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 Recommend MRI for further evaluation.  Groundglass opacities within the bilateral lung apices. This may represent developing infectious or inflammatory process.      Assessment:     Active Hospital Problems    Diagnosis    Primary Problem: Acute ischemic right MCA stroke (CMS [facility])          Alexie Lanni is a 89 y.o. female admitted to the Neurocritical Care Service for sp MT of R MCA territory CVA.      DNR Status this admission:  Full  Code  Palliative/Supportive Care consulted?  no  Hospice Consulted?  Not applicable    Current Comorbid Conditions - Neurology Consult  Severe Brain Conditions: NA  Coma (GCS less than 8):Coma -Not applicable  TIA not applicable  Encephalopathy:  no  Encephalitis-Not applicable  Seizure-Not applicable   Acute Respiratory failure unspecified,  Vent on admission 1/4(date)  Renal Failure: Acute  Treatment: Increase level of care  Coagulopathy Not applicable  N/A    DVT/PE Prophylaxis: SCDs/ Venodynes/Impulse boots     Plan:     Neurologic:       Ischemic Stroke Labs:  Lab Results   Component Value Date  LDLCHOL 92 10/23/2022      Acute Ischemic Stroke:  Location R MCA.  NIHSS 20.  TPA not given due to recent surgery..  Mechanical Thrombectomy performed with TICI 3 flow. R ICA stent placed.  Neurological exam notable for the following:  1/1/1T pt not reversed from anesthesia. .  Neurological exam continues to fluctuate, but largely unchanged..  Monitoring   Continue q1h Neurochecks & Vitals, STAT Head CT for changes in exam  Strict NPO until passes a dysphagia screen  Diagnostics:  Labs:  A1c, Lipid Panel, CRP, ESR, RPR, TSH  Carotid Imaging:  CT-angiogram demonstrated R ICA stenosis.  Repeat CT scan at 24h to monitor for hemorrhagic conversion  Surface Echocardiogram:  Ordered  MRI brain:  Ordered  Therapeutics:  Antithrombotic Therapy:  Aggrastat  post op to transition to DAPT  Statin Therapy:  High-intensity therapy with Atorvastatin  40mg  daily.  Continue Physical, Occupational, and Speech Therapy          Cardiovascular:     Status:   Hemodynamically stable, no arrhythmias.  Vitals:  Heart Rate Pulse  Avg: 93.6  Min: 75  Max: 107     Blood Pressure BP  Min: 123/59  Max: 162/108     Cardiac Labs:  No results found for: TROPONIN, TROPONINI, TROPONIINI, BNP   Neuroscience blood pressure goals:  per Stroke guidelines s/p mechanical thrombectomy, strict SBP 100-140.    Use Hydralazine/Labetalol per  parameters.  Continue current antihypertensive regimen:  hold home medications unless hypertensive.  Admission Troponin/EKG:  Ordered/ ordered  Surface Echocardiogram:  ordered        Pulmonary:     Status:  Acute hypoxic respiratory failure.  Oxygen Requirement:  Mode: APV (SIMV)    Most Recent Arterial Blood Gas:  Lab Results   Component Value Date    PH 7.41 01/30/2024    PCO2 45 01/30/2024    PO2 81 (L) 01/30/2024      Continue Incentive Spirometry  Maintain SpO2 >90%  Wean ventilator as tolerated.        Gastrointestinal     Most recent LFTs:  Lab Results   Component Value Date    AST 27 01/28/2024    ALT 21 01/28/2024    ALKPHOS 86 01/28/2024    INR 1.06 01/28/2024      Diet:  npo  GI PPx:  Famotidine  20mg  bid  Bowel Regimen:  Miralax  17g daily (hold for loose stools) and Senna 8.6mg  daily (hold for loose stools)             Renal:   Status:  No evidence of acute kidney injury.  Continue to monitor I/Os.  Most recent Renal Labs:  Lab Results   Component Value Date    SODIUM 137 01/30/2024    POTASSIUM 4.3 01/30/2024    BUN 20 01/30/2024    CREATININE 1.06 (H) 01/30/2024    CALCIUM  9.2 01/30/2024    MAGNESIUM 1.9 01/29/2024      IV Fluids:  Plasmalyte at 75 ml/h  Resupplement electrolytes prn        Hematology:     Most recent Hematology Labs:  Lab Results   Component Value Date    HGB 11.5 01/30/2024    MCV 87.5 01/30/2024    MCH 27.7 01/30/2024    PLTCNT 171 01/30/2024      Hemoglobin:  Hemoglobin stable   Platelet Count:  Platelets adequate.  DVT Prophylaxis:  SCDs, anticoagulation held due to mt.  Aggristat post op and transition  to DAPT per Neuro IR        Endocrine:       Most recent Endocrine Labs:  Lab Results   Component Value Date    GLUIP 116 (H) 01/30/2024      Neuroscience Glycemic Goals < 180  Glycemic regimen:  No history of diabetes.  Continue Insulin  Sliding Scale.        Infectious Disease:     Status:  Afebrile with decreasing leukocytosis  Temperature Profile:  Temperature Temp  Avg: 37.4 C  (99.3 F)  Min: 36 C (96.8 F)  Max: 38.8 C (101.8 F)       WBC Count  Lab Results   Component Value Date    WBC 12.3 (H) 01/30/2024    WBC 13.4 (H) 01/29/2024    WBC 13.3 (H) 01/28/2024    PMNS 83.2 01/30/2024    LYMPHO 6.7 01/30/2024      Monitor for signs and sx of infection        Other Systems:     Lines/Drains/Access:  Patient Lines/Drains/Airways Status       Active Line / Dialysis Catheter / Dialysis Graft / Drain / Airway / Wound       Name Placement date Placement time Site Days    Peripheral IV Ultrasound guided Right Basilic  (medial side of arm) 01/29/24  2330  -- less than 1    Peripheral IV Left;Posterior Dorsal Metacarpals  (top of hand) 01/30/24  0745  -- less than 1    Arterial Line Left Femoral 01/30/24  1014  Femoral  less than 1    Foley Catheter 01/28/24  1921  -- 1    EndoTracheal Tube Cuffed;Oral 7.0 22 Lip 01/30/24  1005  -- less than 1    Wound  Incision Left;Outer Thigh 01/29/24  1032  -- 1                     Emergency Contact:  Extended Emergency Contact Information  Primary Emergency Contact: Gersten,EDWARD  Address: [address]           [place], [place] [address] [place]  Home Phone: [phone]  Work Phone: [phone]  Mobile Phone: [phone]  Relation: Husband  Preferred language: English  Interpreter needed? No  Secondary Emergency Contact: [facility]  Address: [address]           [place], [place] [address] [place]  Home Phone: [phone]  Work Phone: [phone]  Mobile Phone: [phone]  Relation: Daughter  Preferred language: English  Interpreter needed? No     Restraints:  Bilateral soft wrist restraints.  I have assessed this patient and find the continuation of restraints appropriate due to tendency to pull at life-sustaining lines that will result in direct and/or immediate injury if dislodged.  I will recommend their removal as soon as the patient can reliably follow commands and not pull at these lines.  Skin:   intact.  Activity:  Out of bed to chair  Disposition:  Remain in NSICU due to frequency of neurochecks.      Code Status:  FULL CODE     Critical Care Attestation     I was present at the bedside of this critically ill patient exclusive of procedures that are documented elsewhere.  My services were independent and non-duplicative of other practioners of other specialties (non-Neurocritical Care).    This patient suffers from failure or dysfunction of Neurological system(s).  The care of this patient was in regard to managing (a) conditions(s) that has a high probability of sudden, clinically significant, or life-threatening deterioration and required a high degree of APP attention and direct involvement to intervene urgently. Data review and care planning was performed in direct proximity of the patient, examination was obviously performed in direct contact with the patient. All of this time was exclusive of procedure which will be documented elsewhere in the chart.   My critical care time involved full attention to the patient's condition and included:   Review of nursing notes and/or old charts   Review of medications, allergies, and vital signs   Documentation time   Consultant collaboration on findings and treatment options   Care, transfer of care, and discharge plans   Ordering, interpreting, and reviewing diagnostic studies/ lab tests   Obtaining necessary history from family, EMS, nursing home staff and/or treating physicians     My critical care time did not include time spent teaching resident physician(s), APPs or performing other reported procedures.     Total Critical Care Time: 36 minutes  Parts of this patient's chart may be completed in a retrospective fashion due to simultaneous direct patient care activities in the NCCU.   This note was partially generated using MModal Fluency Direct system, and there may be some incorrect words, spellings, and punctuation that were not noted in checking the  note before saving.    Glendia DELENA Molt, APRN, CNP 01/30/2024, 11:41        Attending Note Findings and Recommendations:    Neuro: right ICA MCA t occlusion s/p thrombectomy.  On arrival to NCCU patient still sedated/ paralyzed  Pulmonary:  ETT in place, wean to extubate as able    Otherwise Plan as Above in Resident or Midlevel's Note.     Critical Care Attestation   I have reviewed the resident/ midlevel's note.  I agree with their findings and/or have made additions/ edits as well as my findings above.    I was present at the bedside of this critically ill patient exclusive of procedures that are documented elsewhere.  My services were independent and non-duplicative of other practioners of other specialties (non-Neurocritical Care).    This patient suffers from failure or dysfunction of  Neurological system(s).     The care of this patient was in regard to managing (a) conditions(s) that has a high probability of sudden, clinically significant, or life-threatening deterioration and required a high degree of Attending Physician attention and direct involvement to intervene urgently. Data review and care planning was performed in direct proximity of the patient, examination was obviously performed in direct contact with the patient. All of this time was exclusive of procedure which will be documented elsewhere in the chart.   My critical care time involved full attention to the patient's condition and included:   Review of nursing notes and/or old charts   Review of medications, allergies, and vital signs   Documentation time   Consultant collaboration on findings and treatment options   Care, transfer of care, and discharge plans   Ordering, interpreting, and reviewing diagnostic studies/ lab tests   Obtaining necessary history from family, EMS, nursing home staff and/or treating physicians     My critical care time did not include time spent teaching resident physician(s) or other services of resident physicians, or  performing other reported procedures.     Total Critical Care Time: 20 minutes    Donnice Sharps, MD 01/30/2024, 15:41        [  1]   Social History  Tobacco Use    Smoking status: Never    Smokeless tobacco: Never   Vaping Use    Vaping status: Never Used   Substance Use Topics    Alcohol use: Never    Drug use: Never   [2] No Known Allergies

## 2024-01-30 NOTE — Ancillary Notes (Signed)
 [place]  [employer]  MRI Technologist Note        MRI has been completed.        Alverta Lawrence, RT (R)(MR) 01/30/2024, 21:32

## 2024-01-30 NOTE — H&P (Signed)
 [facility]   Stroke H&P      Bridget [name], Bridget [name], 89 y.o. female  Date of Admission:  01/30/2024  Date of Birth:  11-Jul-1934    PCP: Deward Squibb, MD    Information Obtained from: patient and history reviewed via medical record  Chief Complaint:  Left-sided weakness    HPI: Bridget [name] is a 89 y.o. female who was currently admitted to [facility], s/p left hip replacement. She has a past medical history of dementia, hypertension, hyperlipidemia, hypothyroidism, and severe right carotid stenosis/occlusion for which she follows with vascular surgery. She also has had a right cerebellar infarct in 09/2022. This morning she had an acute neurological change for which code stroke was activated. Patient developed acute onset left sided weakness and encephalopathy and telestroke was performed with Dr. Hildegard. NIHSS 20. CT stroke protocol performed at [facility] showed severe stenosis of the right ICA origin, multifocal thrombus with severe narrowing in the right cavernous and supraclinoid ICA, multifocal thrombus in the right MCA M1 segment with near complete occlusion, and occlusion at the right ACA origin with prompt reconstitution of the right A1 segment. There is mismatch perfusion defect in the right MCA territory. She was taking Plavix  and rosuvastatin  20 mg PTA. At baseline she is ambulatory and has some mild cognitive changes, including memory loss. Patient was transferred to [facility] for mechanical thrombectomy and was taken directly to IR by neurology stroke team and [facility].        Admission Source: Transfer from another hospital -  [facility]  Last Known Well Date/Time: 01/30/2024  04:30   NIH Stroke Scale on arrival: 25  ICH Score: N/A      Baseline functional status prior to stroke and/or treatment (mRS): 3 -The patient has moderate disability; requiring some external help but able to walk without the assistance of another individual.    Was this a Stroke page:  No.   Thrombolytic given  No,  thrombolytic not given. Reason for thrombolytic not given-  Out of Window    and Other recent hip replacement. INR candidate?   Yes: Date/Time Radiology Notified: 01/30/2024  08:35. Patient arrived via [facility] and transferred directly to IR at [phone]. No stroke page was called.      Location (of pain): Quality (character of pain) Severity (minimal, mild, severe, scale or 1-10) Duration (how long has pain/sx present) Timing (when does pain/sx occur)  Context (activity at/before onset) Modifying Factors (what makes pain/sx  Better/worse) Associate Sign/Sx (what accompanies main pain/sx)    Past Medical History:   Diagnosis Date    Arthritis     HTN (hypertension)          Past Surgical History:   Procedure Laterality Date    KNEE SURGERY           Medications Prior to Admission       Prescriptions    amLODIPine  (NORVASC ) 5 mg Oral Tablet    TAKE 1 TABLET BY MOUTH EVERY DAY. STOP TAKING THE AMLODIPINE  2.5MG     aspirin  81 mg Oral Tablet, Chewable    Chew 1 Tablet (81 mg total) Daily    clopidogreL  (PLAVIX ) 75 mg Oral Tablet    Take 1 Tablet (75 mg total) by mouth Once a day for 30 days    cyanocobalamin  (VITAMIN B-12) 1,000 mcg Oral Tablet Sustained Release    Take 1 Tablet (1,000 mcg total) by mouth Daily    donepeziL  (ARICEPT ) 5 mg Oral Tablet  Take 1 Tablet (5 mg total) by mouth Every night    levothyroxine  (SYNTHROID ) 75 mcg Oral Tablet    Take 1 Tablet (75 mcg total) by mouth Every morning    lisinopriL -hydrochlorothiazide  (ZESTORETIC ) 20-12.5 mg Oral Tablet    Take 1 Tablet by mouth Daily    MULTI-VITAMIN ORAL    Take by mouth    risedronate (ACTONEL) 150 mg Oral Tablet    Take 1 Tablet (150 mg total) by mouth Per Instructions PLEASE SEE ATTACHED FOR DETAILED DIRECTIONS    rosuvastatin  (CRESTOR ) 20 mg Oral Tablet    Take 1 Tablet (20 mg total) by mouth Every evening for 30 days    sertraline  (ZOLOFT ) 25 mg Oral Tablet    Take 1 Tablet (25 mg total) by mouth Daily          Allergies[1]  Social History     Tobacco  Use    Smoking status: Never    Smokeless tobacco: Never   Substance Use Topics    Alcohol use: Never     Past Family History:   Family Medical History:       Problem Relation (Age of Onset)    No Known Problems Mother, Father, Sister, Brother, Maternal Grandmother, Maternal Grandfather, Paternal Grandmother, Paternal Grandfather, Daughter, Son, Maternal Aunt, Maternal Uncle, Paternal Aunt, Paternal Uncle, Other            ROS: Other than ROS in the HPI, all other systems were negative.    Exam:     General: acutely ill  HENT:Head atraumatic and normocephalic  Neck: supple, symmetrical, trachea midline  Lungs: Breathing nonlabored  [place]: Eye opening:  3 to speech, Verbal response:  2 incomprehensible sounds, Best motor response:  4 flexion withdrawal  Mental status:  Level of Consciousness: arousable by verbal stimuli  Orientations: Disoriented to time, Disoriented to place, and Disoriented to person  Memory: Follows commands 0 steps  AttentionsAttention decreased and Concentration decreased  Knowledge: Poor  Language: Repeat no  Speech: Slurred and incomprehensible sounds  Cranial nerves:   CN2: Does not blink to threat on left  CN 3,4,6: Gaze palsy, right gaze preference. Does not cross midline  CN 5: Decreased sensation left  CN 7: Facial weakness:  left central  CN 8: Hearing grossly intact  CN 9,10: dysarthria:  yes  CN 11: Trapezius strength: left weak  CN 12: Tongue normal with no fasiculations or deviation  Gait, Coordination, and Reflexes:   Gait: Unable to ambulate  Coordination: Coordination is normal without tremor    Muscle tone: flaccid on left  Muscle exam  Arm Right Left Leg Right Left   Deltoid 5/5 0/5 Iliopsoas 5/5 1/5   Biceps 5/5 0/5 Quads 5/5 1/5   Triceps 5/5 0/5 Hamstrings 5/5 1/5   Wrist Extension 5/5 0/5 Ankle Dorsi Flexion 5/5 1/5   Wrist Flexion 5/5 0/5 Ankle Plantar Flexion 5/5 1/5                       Sensory: Sensory exam in the upper and lower extremities is normal  [place] :  Decreased on left  Diabetes Monitors:    Results in Last 18 Months   Lab Test 10/23/22  0506   HA1C 5.7   CHOLESTEROL 149   HDLCHOL 37*   LDLCHOL 92   TRIG 108       Labs:    I have reviewed all lab results.  Lab Results Today:  Results for orders placed or performed during the hospital encounter of 01/28/24 (from the past 24 hours)   URINALYSIS, MACROSCOPIC   Result Value Ref Range    COLOR Yellow Yellow, Colorless    APPEARANCE Clear Clear    SPECIFIC GRAVITY 1.033 (H) 1.005 - 1.030    PH 6.0 5.0 - 8.0    PROTEIN 30 (A) Negative mg/dL    GLUCOSE Negative Negative mg/dL    KETONES Negative Negative mg/dL    UROBILINOGEN Negative Negative mg/dL    BILIRUBIN Negative Negative mg/dL    BLOOD Small (A) Negative mg/dL    NITRITE Negative Negative    LEUKOCYTES Negative Negative WBCs/uL   URINALYSIS, MICROSCOPIC   Result Value Ref Range    RBCS 25.0 (H) <6.0 /hpf    WBCS 10.0 <11.0 /hpf    BACTERIA Occasional or less Occasional or less /hpf    MUCOUS Light Light /hpf    HYALINE CASTS 8.0 (H) <4.0 /lpf    SQUAMOUS EPITHELIAL Occasional or less Occasional or less /hpf   POC BLOOD GLUCOSE (RESULTS)   Result Value Ref Range    GLUCOSE, POC 116 (H) 70 - 100 mg/dl   BLOOD GAS W/ CO-OX, LYTES, LACTATE REFLEX Arterial   Result Value Ref Range    %FIO2 (ARTERIAL) 32 %    PH (ARTERIAL) 7.41 7.35 - 7.45    PCO2 (ARTERIAL) 45 35 - 45 mm/Hg    PO2 (ARTERIAL) 81 (L) 83 - 108 mm/Hg    BICARBONATE (ARTERIAL) 27.5 21.0 - 28.0 mmol/L    BASE EXCESS (ARTERIAL) 3.3 (H) 0.0 - 3.0 mmol/L    PAO2/FIO2 RATIO 253     O2 SATURATION (ARTERIAL) 96.0 94.0 - 98.0 %    HEMOGLOBIN 12.3 12.0 - 18.0 g/dL    HEMATOCRITRT 37 37 - 50 %    OXYHEMOGLOBIN 95.2 (H) 90.0 - 95.0 %    CARBOXYHEMOGLOBIN 1.8 <=3.0 %    MET-HEMOGLOBIN 0.8 <=1.5 %    O2CT 16.5 %    SODIUM 132 (L) 136 - 145 mmol/L    HEMOLYSIS None None, Mild    WHOLE BLOOD POTASSIUM 4.3 3.5 - 5.1 mmol/L    CHLORIDE 108 (H) 98 - 107 mmol/L    IONIZED CALCIUM  1.30 1.15 - 1.33 mmol/L    GLUCOSE 142 (H)  65 - 125 mg/dL    LACTATE 0.7 <=8.0 mmol/L   CBC WITH DIFF   Result Value Ref Range    WBC 12.3 (H) 3.7 - 11.0 x103/uL    RBC 4.15 3.85 - 5.22 x106/uL    HGB 11.5 11.5 - 16.0 g/dL    HCT 63.6 65.1 - 53.9 %    MCV 87.5 78.0 - 100.0 fL    MCH 27.7 26.0 - 32.0 pg    MCHC 31.7 31.0 - 35.5 g/dL    RDW-CV 85.5 88.4 - 84.4 %    PLATELETS 171 150 - 400 x103/uL    MPV 10.4 8.7 - 12.5 fL    NEUTROPHIL % 83.2 %    LYMPHOCYTE % 6.7 %    MONOCYTE % 9.1 %    EOSINOPHIL % 0.1 %    BASOPHIL % 0.2 %    NEUTROPHIL # 10.21 (H) 1.50 - 7.70 x103/uL    LYMPHOCYTE # 0.82 (L) 1.00 - 4.80 x103/uL    MONOCYTE # 1.12 (H) 0.20 - 1.10 x103/uL    EOSINOPHIL # <0.10 <=0.50 x103/uL    BASOPHIL # <0.10 <=0.20 x103/uL    IMMATURE GRANULOCYTE %  0.7 0.0 - 1.0 %    IMMATURE GRANULOCYTE # <0.10 <0.10 x103/uL   TROPONIN-I   Result Value Ref Range    TROPONIN-I HS 120.6 (HH) <=14.0 ng/L   BASIC METABOLIC PANEL   Result Value Ref Range    SODIUM 137 136 - 145 mmol/L    POTASSIUM 4.3 3.5 - 5.1 mmol/L    CHLORIDE 106 96 - 111 mmol/L    CO2 TOTAL 25 23 - 31 mmol/L    ANION GAP 6 4 - 13 mmol/L    CALCIUM  9.2 8.6 - 10.3 mg/dL    GLUCOSE 873 (H) 65 - 125 mg/dL    BUN 20 8 - 25 mg/dL    CREATININE 8.93 (H) 0.60 - 1.05 mg/dL    eGFRcr - FEMALE 51 (L) >=60 mL/min/1.25m2    BUN/CREA RATIO 19 6 - 22   ECG 12 LEAD   Result Value Ref Range    Ventricular rate 95 BPM    Atrial Rate 95 BPM    PR Interval 192 ms    QRS Duration 146 ms    QT Interval 360 ms    QTC Calculation 452 ms    Calculated P Axis 44 degrees    Calculated R Axis -68 degrees    Calculated T Axis 9 degrees       Review of reports and notes reveal:    Independent Interpretation of images or specimens:  1. CT of the Brain performed on 01/30/2024 on [facility] PACS and it shows there is severe stenosis of the right ICA origin. There is multifocal thrombus with severe narrowing in the right cavernous and supraclinoid ICA. There is multifocal thrombus in the right MCA M1 segment with near complete  occlusion. There is occlusion at the right ACA origin with prompt reconstitution of the right A1 segment. There is mismatch perfusion defect in the right MCA territory.     Assessment/Plan:  Active Hospital Problems    Diagnosis    Primary Problem: Acute ischemic right MCA stroke (CMS HCC)     Bridget [name] is a 89 y.o. female who was currently admitted to [facility], s/p left hip replacement. She has a past medical history of dementia, hypertension, hyperlipidemia, hypothyroidism, and severe right carotid stenosis/occlusion for which she follows with vascular surgery. She also has had a right cerebellar infarct in 09/2022. This morning she had an acute neurological change for which code stroke was activated. Patient developed acute onset left sided weakness and encephalopathy and telestroke was performed with Dr. Hildegard. CT stroke protocol performed at [facility] showed severe stenosis of the right ICA origin, multifocal thrombus with severe narrowing in the right cavernous and supraclinoid ICA, multifocal thrombus in the right MCA M1 segment with near complete occlusion, and occlusion at the right ACA origin with prompt reconstitution of the right A1 segment. There is mismatch perfusion defect in the right MCA territory. She was taking Plavix  and rosuvastatin  20 mg PTA. At baseline she is ambulatory and has some mild cognitive changes, including memory loss. Patient was transferred to [facility] for mechanical thrombectomy and was taken directly to IR by neurology stroke team and [facility].    Right ICA/MCA occlusion  -Patient taken directly to IR for mechanical thrombectomy.  -No thrombolytic given as patient had total hip replacement yesterday.  -Rest of care per Neuro IR and [facility].      Delon Sierras, MSN, APRN, FNP-C  APP Neurology/Stroke          I personally  saw and examined the patient. See Nurse Practitioner's note for additional details. My findings are : [facility] transfer for LVO stroke. Patient seen  before EVT. Tandem right sided occlusion. Patient to be admitted to [facility] post-procedure.     Deatrice Hands, MD          [1] No Known Allergies

## 2024-01-30 NOTE — Consults (Signed)
 [facility]  Orthopaedics Progress Note    Date of service: 01/30/2024  Post Op Day: 1 Day Post-Op S/P  left hip hemiarthroplasty  Diagnosis: Closed left hip fracture       Subjective:   Patient with acute neurological changes, diagnosed with acute stroke, to be transferred to [facility].  Also had fever overnight, given antibiotics.     Vital Signs:  Temp (24hrs) Max:38.8 C (101.8 F)      Temperature: (!) 38.8 C (101.8 F) (01/30/24 0717)  Heart Rate: 94 (01/30/24 0745)  BP (Non-Invasive): (!) 123/59 (01/30/24 0745)  Respiratory Rate: 18 (01/30/24 0745)  SpO2: 97 % (01/30/24 0745)    Current Medications:  acetaminophen  (OFIRMEV ) 1,000 mg (10 mg/mL) IV 100 mL (tot vol), 1,000 mg, Intravenous, Q6H PRN  acetaminophen  (TYLENOL ) tablet, 650 mg, Oral, Q4H PRN  albuterol  (PROVENTIL ) 2.5 mg / 3 mL (0.083%) neb solution, 2.5 mg, Nebulization, Q4H PRN  amLODIPine  (NORVASC ) tablet, 5 mg, Oral, Daily  aspirin  chewable tablet 81 mg, 81 mg, Oral, Daily  clopidogrel  (PLAVIX ) 75 mg tablet, 75 mg, Oral, Daily  cyanocobalamin  (VITAMIN B12) tablet, 1,000 mcg, Oral, Daily with Lunch  [Held by provider] donepezil  (ARICEPT ) tablet, 5 mg, Oral, NIGHTLY  electrolyte-pH 7.4 (NORMOSOL-R  pH 7.4) bolus infusion 1,000 mL, 1,000 mL, Intravenous, Once  electrolyte-R pH 7.4 (NORMOSOL-R  pH 7.4) premix infusion, , Intravenous, Continuous  [Held by provider] enoxaparin  PF (LOVENOX ) 40 mg/0.4 mL SubQ injection, 40 mg, Subcutaneous, Daily  famotidine  (PEPCID ) tablet, 20 mg, Oral, 2x/day  lisinopril  (PRINIVIL ) tablet, 20 mg, Oral, Daily   And  hydroCHLOROthiazide  (HYDRODIURIL ) tablet, 12.5 mg, Oral, Daily  [Held by provider] HYDROcodone -acetaminophen  (NORCO) 5-325 mg per tablet, 1 Tablet, Oral, Q4H PRN  levothyroxine  (SYNTHROID ) tablet, 100 mcg, Oral, QAM  melatonin tablet, 3 mg, Oral, HS PRN  multivitamin (THERA) tablet, 1 Tablet, Oral, Daily  NS flush syringe, 3 mL, Intracatheter, Q8HRS  NS flush syringe, 3 mL, Intracatheter, Q1H PRN  ondansetron   (ZOFRAN ) 2 mg/mL injection, 4 mg, Intravenous, Q6H PRN  piperacillin -tazobactam (ZOSYN ) 4.5 g in iso-osmotic 100 mL premix IVPB, 4.5 g, Intravenous, Now  piperacillin -tazobactam (ZOSYN ) 4.5 g in iso-osmotic 100 mL premix IVPB, 4.5 g, Intravenous, Q8H  polyethylene glycol (MIRALAX ) oral packet, 17 g, Oral, Daily PRN  rosuvastatin  (CRESTOR ) tablet, 20 mg, Oral, QPM  sertraline  (ZOLOFT ) tablet, 25 mg, Oral, Daily  vancomycin  (VANCOCIN ) 1 g in D5W 200 mL premix IVPB, 15 mg/kg (Adjusted), Intravenous, Once  [START ON 01/31/2024] vancomycin  (VANCOCIN ) 1 g in D5W 200 mL premix IVPB, 15 mg/kg (Adjusted), Intravenous, Q24H  Vancomycin  IV - Pharmacist to Dose per Protocol, , Does not apply, Daily PRN        Today's Physical Exam:  General: No acute distress  Pulmonary: Breathing nonlabored   Psychiatric: Not communicative  Integument: Skin is generally warm and dry      Musculoskeletal:    Dressing intact, not removed.  There is clearly demarcated redness of the distal incision, visualized anterior to the dressing.  Mild swelling, expected postoperatively.    Labs Reviewed  Lab Results   Component Value Date    HGB 11.5 01/30/2024    HCT 36.3 01/30/2024    INR 1.06 01/28/2024     BMP:   137 (01/04 0639) 106 (01/04 0639) 20 (01/04 9360)    /         4.3 (01/04 9360) 25 (01/04 9360) 1.06* (01/04 9360) \  CBC:     12.3* (01/04 9360) \   11.5 (01/04 9360) /   171 (01/04 9360)      / 36.3 (01/04 9360) \             Radiology Tests   Results for orders placed or performed during the hospital encounter of 01/28/24 (from the past 24 hours)   XR LOW PELVIS W LEFT LATERAL HIP     Status: None    Narrative    Bridget [name]  Female, 89 years old.    XR LOW PELVIS W LEFT LATERAL HIP performed on 01/29/2024 2:59 PM.    REASON FOR EXAM:  postop pacu, Post Lt Hip Sx     TECHNIQUE: 2 views/2 images submitted for interpretation.    COMPARISON: CT chest abdomen pelvis performed 01/28/2024.    FINDINGS:  Interval postoperative  changes are identified from left hip arthroplasty. Alignment appears appropriate. There is expected postoperative subcutaneous air and soft tissue swelling, with a surgical drain and skin staples in place. Postoperative intraosseous gas is also suspected in the femur. Moderate degenerative arthrosis is noted in the right hip. Degenerative changes are seen throughout the pelvis.      Impression    Expected acute postoperative changes from left hip arthroplasty.        Radiologist location ID: [id number]     CT BRAIN WO IV CONTRAST - POSSIBLE STROKE     Status: None    Narrative    Bridget [name]  Female, 89 years old.    CT BRAIN WO IV CONTRAST - POSSIBLE STROKE performed on 01/30/2024 6:57 AM    INDICATION: 89 years old Female Possible Stroke    TECHNIQUE: CT of the head acquired without contrast administration    RADIATION DOSE: 2296.80 mGy.cm    Technique:Head CT without intravenous contrast followed by CTA imaging of the circle of Willis from the skull base to the vertex with intravenous contrast.  3D reconstructions.      COMPARISON:  CT head 01/28/2024.    FINDINGS:  CT Brain:  There is no acute intracranial hemorrhage or extra-axial fluid collection. Normal gray-white differentiation is maintained, without CT evidence of an acute territorial infarct. Periventricular and subcortical hypodensities are likely attributable to chronic microvascular disease. Mild diffuse cerebral volume loss is present. Calcifications are seen within the carotid siphons.    Ventricles and basal cisterns are normal in caliber. There is no mass effect or midline shift.    Intraorbital contents are unremarkable. The paranasal sinuses and mastoid air cells are well-aerated. The calvarium is normal.    CT ANGIOGRAM HEAD - FINDINGS:  There is normal enhancement of the cerebral arteries and major branch vessels. No mass or aneurysm is seen. There is severe stenosis of the cavernous right ICA.SABRAThe right MCA demonstrates diminished flow  secondary to diminutive size of the right ICA. There is severely asymmetrically diminished perfusion within the right MCA territory. Anterior cerebral arteries are unremarkable bilaterally. Bilateral posterior communicating arteries are the native. Bilateral intracranial vertebral arteries are patent as are the posterior inferior cerebellar arteries. Basilar artery and bilateral posterior cerebral arteries are patent.    CT ANGIOGRAM NECK - FINDINGS:  The right ICA is diminutive.. There is normal contrast enhancement of the vasculature. Vertebral arteries are patent with no evidence of dissection, stenosis or occlusion.  There are groundglass opacities within the bilateral lung apices.        Impression    1. Diminutive right ICA  with severely asymmetrically diminished flow within the right MCA territory. 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 Recommend MRI for further evaluation.  2. Groundglass opacities within the bilateral lung apices. This may represent developing infectious or inflammatory process.    Radiologist location ID: [id number]    Neuroradiology revision: There is severe stenosis of the right ICA origin. The remaining right ICA is diminutive in caliber. There is multifocal thrombus with severe narrowing in the right cavernous and supraclinoid ICA. There is multifocal thrombus in the right MCA M1 segment with near complete occlusion. There is occlusion at the right ACA origin with prompt reconstitution of the right A1 segment. There is mismatch perfusion defect in the right MCA territory.    A Critical Red actionable finding has been sent via the [employer] Actionable Findings application on 01/30/2024 7:39 AM, Message ID [id number]. Receipt of this communication will be communicated to [facility] RADIOLOGY STAFF or responsible provider and will be documented in [employer] Actionable Findings System upon receiving the acknowledgement.      Radiologist location ID: [id number]     CT STROKE  PROTOCOL (CTA HEAD/NECK WO/W)     Status: Abnormal    Narrative    Bridget [name]  Female, 89 years old.    CT BRAIN WO IV CONTRAST - POSSIBLE STROKE performed on 01/30/2024 6:57 AM    INDICATION: 89 years old Female Possible Stroke    TECHNIQUE: CT of the head acquired without contrast administration    RADIATION DOSE: 2296.80 mGy.cm    Technique:Head CT without intravenous contrast followed by CTA imaging of the circle of Willis from the skull base to the vertex with intravenous contrast.  3D reconstructions.      COMPARISON:  CT head 01/28/2024.    FINDINGS:  CT Brain:  There is no acute intracranial hemorrhage or extra-axial fluid collection. Normal gray-white differentiation is maintained, without CT evidence of an acute territorial infarct. Periventricular and subcortical hypodensities are likely attributable to chronic microvascular disease. Mild diffuse cerebral volume loss is present. Calcifications are seen within the carotid siphons.    Ventricles and basal cisterns are normal in caliber. There is no mass effect or midline shift.    Intraorbital contents are unremarkable. The paranasal sinuses and mastoid air cells are well-aerated. The calvarium is normal.    CT ANGIOGRAM HEAD - FINDINGS:  There is normal enhancement of the cerebral arteries and major branch vessels. No mass or aneurysm is seen. There is severe stenosis of the cavernous right ICA.SABRAThe right MCA demonstrates diminished flow secondary to diminutive size of the right ICA. There is severely asymmetrically diminished perfusion within the right MCA territory. Anterior cerebral arteries are unremarkable bilaterally. Bilateral posterior communicating arteries are the native. Bilateral intracranial vertebral arteries are patent as are the posterior inferior cerebellar arteries. Basilar artery and bilateral posterior cerebral arteries are patent.    CT ANGIOGRAM NECK - FINDINGS:  The right ICA is diminutive.. There is normal contrast enhancement  of the vasculature. Vertebral arteries are patent with no evidence of dissection, stenosis or occlusion.  There are groundglass opacities within the bilateral lung apices.        Impression    1. Diminutive right ICA with severely asymmetrically diminished flow within the right MCA territory. 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 Recommend MRI for further evaluation.  2. Groundglass opacities within the bilateral lung apices. This may represent developing infectious or inflammatory process.    Radiologist location ID: [id number]    Neuroradiology revision:  There is severe stenosis of the right ICA origin. The remaining right ICA is diminutive in caliber. There is multifocal thrombus with severe narrowing in the right cavernous and supraclinoid ICA. There is multifocal thrombus in the right MCA M1 segment with near complete occlusion. There is occlusion at the right ACA origin with prompt reconstitution of the right A1 segment. There is mismatch perfusion defect in the right MCA territory.    A Critical Red actionable finding has been sent via the [employer] Actionable Findings application on 01/30/2024 7:39 AM, Message ID [id number]. Receipt of this communication will be communicated to [facility] RADIOLOGY STAFF or responsible provider and will be documented in [employer] Actionable Findings System upon receiving the acknowledgement.      Radiologist location ID: [id number]          Assessment:     Active Hospital Problems   (*Primary Problem)    Diagnosis    *Closed left hip fracture   Left femoral neck fracture  Status pot left hip hemiarthroplasty 01/29/2024  Redness of the wound: differential diagnosis includes cellulitis, bleeding of the subcutaneous tissues, skin trauma from retraction, etc.  Dressing was not removed, as she is less than 24 hours from surgery.  She did have a fever overnight, but it would be unusual to have developed cellulitis less than 24 hours after surgery, especially while on  routine postoperative prophylactic antibiotics.  It may be appropriate to keep her on antibiotics and monitor the wound closely.  At any rate, her acute stroke management is pressing, and she will be transferred to higher level of care.  Orthopaedic care: continue antibiotics, monitor wound, WBAT LLE, bed pillow between knees, anterolateral hip precautions    Loa Camp, MD

## 2024-01-30 NOTE — Anesthesia Postprocedure Evaluation (Addendum)
 Anesthesia Post Op Evaluation    Patient: Bridget [name]  Procedure(s):  IR STROKE THERAPY    Last Vitals:SpO2: 100 % (01/30/24 1130)    No notable events documented.      Patient location during evaluation: ICU       Patient participation: complete - patient participated  Level of consciousness: awake and alert and responsive to verbal stimuli    Pain management: adequate  Airway patency: patent    Anesthetic complications: no  Cardiovascular status: acceptable  Respiratory status: acceptable  Hydration status: acceptable  Patient post-procedure temperature: Pt Normothermic   PONV Status: Absent

## 2024-01-31 ENCOUNTER — Inpatient Hospital Stay (HOSPITAL_COMMUNITY)

## 2024-01-31 ENCOUNTER — Other Ambulatory Visit (HOSPITAL_COMMUNITY): Payer: Self-pay | Admitting: Acute Care

## 2024-01-31 DIAGNOSIS — Z96642 Presence of left artificial hip joint: Secondary | ICD-10-CM

## 2024-01-31 DIAGNOSIS — I452 Bifascicular block: Secondary | ICD-10-CM

## 2024-01-31 DIAGNOSIS — G936 Cerebral edema: Secondary | ICD-10-CM

## 2024-01-31 DIAGNOSIS — Z79899 Other long term (current) drug therapy: Secondary | ICD-10-CM

## 2024-01-31 DIAGNOSIS — E039 Hypothyroidism, unspecified: Secondary | ICD-10-CM

## 2024-01-31 DIAGNOSIS — G935 Compression of brain: Secondary | ICD-10-CM

## 2024-01-31 DIAGNOSIS — Z029 Encounter for administrative examinations, unspecified: Secondary | ICD-10-CM

## 2024-01-31 DIAGNOSIS — I491 Atrial premature depolarization: Secondary | ICD-10-CM

## 2024-01-31 DIAGNOSIS — I081 Rheumatic disorders of both mitral and tricuspid valves: Secondary | ICD-10-CM

## 2024-01-31 DIAGNOSIS — I6601 Occlusion and stenosis of right middle cerebral artery: Secondary | ICD-10-CM

## 2024-01-31 DIAGNOSIS — S72002D Fracture of unspecified part of neck of left femur, subsequent encounter for closed fracture with routine healing: Secondary | ICD-10-CM

## 2024-01-31 DIAGNOSIS — I6521 Occlusion and stenosis of right carotid artery: Secondary | ICD-10-CM

## 2024-01-31 DIAGNOSIS — T1490XA Injury, unspecified, initial encounter: Secondary | ICD-10-CM

## 2024-01-31 DIAGNOSIS — R269 Unspecified abnormalities of gait and mobility: Secondary | ICD-10-CM

## 2024-01-31 DIAGNOSIS — I6389 Other cerebral infarction: Secondary | ICD-10-CM

## 2024-01-31 DIAGNOSIS — Z471 Aftercare following joint replacement surgery: Secondary | ICD-10-CM

## 2024-01-31 DIAGNOSIS — R9431 Abnormal electrocardiogram [ECG] [EKG]: Secondary | ICD-10-CM

## 2024-01-31 DIAGNOSIS — Z9889 Other specified postprocedural states: Secondary | ICD-10-CM

## 2024-01-31 DIAGNOSIS — X58XXXA Exposure to other specified factors, initial encounter: Secondary | ICD-10-CM

## 2024-01-31 DIAGNOSIS — R579 Shock, unspecified: Secondary | ICD-10-CM

## 2024-01-31 DIAGNOSIS — G8194 Hemiplegia, unspecified affecting left nondominant side: Secondary | ICD-10-CM

## 2024-01-31 LAB — TRANSTHORACIC ECHOCARDIOGRAM - ADULT
AV LVOT peak gradient: 11.06 mmHg
AV mean gradient: 8.02 mmHg
AV peak gradient: 18.12 mmHg
Ao VTI: 32.27 cm
Aortic Regur Pressure Half Time: 580.13 ms
Aortic Regurgitant Pressure Half-time: 580.13 ms
Aortic Valve Area by Continuity of Peak Velocity: 2.29 cm2
Aortic Valve Area by Continuity of VTI: 2.81 cm2
Aortic Valve Systolic Peak Velocity: 212.83 cm/s
Ascending aorta: 2.83 cm
EF MEASUREMENT VALUE: 69.7
FS: 55 % (ref 28–44)
IVS: 0.88 cm (ref 0.6–1.1)
Inferior Vena Cava Diameter: 2.59 cm
LV Diastolic Volume Index: 100.8 mL
LV Diastolic Volume Index: 107.7 mL
LV Diastolic Volume Index: 49.5 mL
LV Diastolic Volume Index: 92.2 mL
LV Systolic Volume Index: 29.9 mL
LV Systolic Volume Index: 30.5 mL
LV Systolic Volume Index: 31 mL
LV Systolic Volume Index: 6.6 mL
LVIDD: 3.46 cm (ref 3.5–6.0)
LVIDS: 1.55 cm (ref 2.1–4.0)
LVOT diameter: 1.93 cm
LVOT peak VTI: 30.96 cm
LVOT stroke volume: 90.66 mL
LVPWD: 1.17 cm
Left Ventricular Outflow Tract Mean Gradient: 5.12 mmHg
Left Ventricular Outflow Tract Mean Velocity: 102.96 cm/s
Left Ventricular Outflow Tract Peak Velocity: 166.32 cm/s
MV Deceleration Time: 260.6 ms
MV Peak A Vel: 119.55 cm/s
MV Peak E Vel: 100.1 cm/s
Pulmonic Regurgitant End Max Velocity: 104.76 cm/s
Pulmonic Regurgitant Max Peak Gradiant: 4.67 mmHg
Pulmonic Valve Acceleration Time: 91.34 ms

## 2024-01-31 LAB — BASIC METABOLIC PANEL
ANION GAP: 6 mmol/L (ref 4–13)
BUN/CREA RATIO: 22 (ref 6–22)
BUN: 20 mg/dL (ref 8–25)
CALCIUM: 8 mg/dL — ABNORMAL LOW (ref 8.6–10.3)
CHLORIDE: 109 mmol/L (ref 96–111)
CO2 TOTAL: 23 mmol/L (ref 23–31)
GLUCOSE: 130 mg/dL — ABNORMAL HIGH (ref 65–125)
POTASSIUM: 4 mmol/L (ref 3.5–5.1)
SODIUM: 138 mmol/L (ref 136–145)
eGFRcr - FEMALE: 60 mL/min/1.73mˆ2 (ref >=60)

## 2024-01-31 LAB — CBC
HCT: 28.4 % — ABNORMAL LOW (ref 34.8–46.0)
HGB: 9.1 g/dL — ABNORMAL LOW (ref 11.5–16.0)
MCH: 27.2 pg (ref 26.0–32.0)
MCHC: 32 g/dL (ref 31.0–35.5)
MCV: 85 fL (ref 78.0–100.0)
MPV: 9.5 fL (ref 8.7–12.5)
PLATELETS: 165 x10ˆ3/uL (ref 150–400)
RBC: 3.34 x10ˆ6/uL — ABNORMAL LOW (ref 3.85–5.22)
RDW-CV: 14.7 % (ref 11.5–15.5)
WBC: 11.4 x10ˆ3/uL — ABNORMAL HIGH (ref 3.7–11.0)

## 2024-01-31 LAB — PLATELET MAPPING BY TEG
AA (MA): 27 MM (ref 51.0–71.0)
ADP (MA): 27.9 MM (ref 45.0–69.0)
ActF (MA): 24 MM (ref 2.0–19.0)
HKH (MA): 69.5 MM (ref 53.0–68.0)

## 2024-01-31 LAB — POC BLOOD GLUCOSE (RESULTS)
GLUCOSE, POC: 119 mg/dL (ref 65–125)
GLUCOSE, POC: 197 mg/dL — ABNORMAL HIGH (ref 65–125)
GLUCOSE, POC: 214 mg/dL — ABNORMAL HIGH (ref 65–125)
GLUCOSE, POC: 196 mg/dL — ABNORMAL HIGH (ref 65–125)

## 2024-01-31 LAB — ECG 12-LEAD
Atrial Rate: 70 {beats}/min
Calculated P Axis: 0 degrees
Calculated R Axis: -59 degrees
Calculated T Axis: 14 degrees
PR Interval: 192 ms
QRS Duration: 142 ms
QT Interval: 412 ms
QTC Calculation: 444 ms
Ventricular rate: 70 {beats}/min

## 2024-01-31 LAB — PHOSPHORUS: PHOSPHORUS: 2.5 mg/dL (ref 2.3–4.0)

## 2024-01-31 LAB — MAGNESIUM: MAGNESIUM: 1.9 mg/dL (ref 1.8–2.6)

## 2024-01-31 MED ORDER — MAGNESIUM HYDROXIDE 400 MG/5 ML ORAL SUSPENSION
15.0000 mL | Freq: Four times a day (QID) | ORAL | Status: DC
  Administered 2024-02-01: 1200 mg via GASTROSTOMY
  Filled 2024-01-31: qty 30

## 2024-01-31 MED ORDER — POLYETHYLENE GLYCOL 3350 17 GRAM ORAL POWDER PACKET
34.0000 g | Freq: Every day | ORAL | Status: DC
  Administered 2024-02-01: 34 g via GASTROSTOMY
  Filled 2024-01-31: qty 2

## 2024-01-31 MED ORDER — SODIUM CHLORIDE 0.9 % INJECTION SOLUTION
2.0000 mL | INTRAVENOUS | Status: AC
  Administered 2024-01-31: 2 mL via INTRAVENOUS

## 2024-01-31 MED ORDER — LEVOTHYROXINE 75 MCG TABLET
75.0000 ug | ORAL_TABLET | Freq: Every morning | ORAL | Status: DC
  Administered 2024-01-31 – 2024-02-01 (×2): 75 ug via GASTROSTOMY
  Filled 2024-01-31 (×2): qty 1

## 2024-01-31 NOTE — Consults (Signed)
 [facility]  Neurosurgery Consult  Initial Consult Note      Bridget, [name], 89 y.o. female  Date of Admission:  01/30/2024  Date of Service: 01/30/2024  Date of Birth:  February 23, 1934    Primary Service: NCCU  Requesting Faculty: Dr. Claudene  Reason for Consult: Tandem R ICA/M1 occlusion      Information Obtained from: history reviewed via medical record  Chief Complaint: L hemiparesis + encephalopathy     HPI:  Bridget [name] is a 89 y.o., White female with PMH dementia, R carotid stenosis/occlusion, who presents after fall and found to have L hip fx s/p arthroplasty (01/29/23). Patient was noted to have acute L hemiparesis and encephalopathy this morning and stroke code was called showing concern for acute R ICA + M1 stroke. Patient was subsequently transferred to [facility] where R ICA stent/balloon angioplasty with TICI3 recanalization was performed. Patient was admitted to NCCU post-procedure and remained intubated. Neurosurgery consulted for crani watch.     ROS: (MUST comment on all Abnormal findings)  Review of systems was not obtained due to intubated .    PAST MEDICAL/ FAMILY/ SOCIAL HISTORY:   Past Medical History:   Diagnosis Date    Arthritis     HTN (hypertension)          Medications Prior to Admission       Prescriptions    amLODIPine  (NORVASC ) 5 mg Oral Tablet    TAKE 1 TABLET BY MOUTH EVERY DAY. STOP TAKING THE AMLODIPINE  2.5MG     aspirin  81 mg Oral Tablet, Chewable    Chew 1 Tablet (81 mg total) Daily    clopidogreL  (PLAVIX ) 75 mg Oral Tablet    Take 1 Tablet (75 mg total) by mouth Once a day for 30 days    cyanocobalamin  (VITAMIN B-12) 1,000 mcg Oral Tablet Sustained Release    Take 1 Tablet (1,000 mcg total) by mouth Daily    donepeziL  (ARICEPT ) 5 mg Oral Tablet    Take 1 Tablet (5 mg total) by mouth Every night    levothyroxine  (SYNTHROID ) 75 mcg Oral Tablet    Take 1 Tablet (75 mcg total) by mouth Every morning    lisinopriL -hydrochlorothiazide  (ZESTORETIC ) 20-12.5 mg Oral Tablet    Take  1 Tablet by mouth Daily    MULTI-VITAMIN ORAL    Take by mouth    risedronate (ACTONEL) 150 mg Oral Tablet    Take 1 Tablet (150 mg total) by mouth Per Instructions PLEASE SEE ATTACHED FOR DETAILED DIRECTIONS    rosuvastatin  (CRESTOR ) 20 mg Oral Tablet    Take 1 Tablet (20 mg total) by mouth Every evening for 30 days    sertraline  (ZOLOFT ) 25 mg Oral Tablet    Take 1 Tablet (25 mg total) by mouth Daily           acetaminophen  (TYLENOL ) tablet, 650 mg, Gastric (NG, OG, PEG, GT), Q6H PRN  aspirin  chewable tablet 81 mg, 81 mg, Gastric (NG, OG, PEG, GT), Daily  clopidogrel  (PLAVIX ) 75 mg tablet, 75 mg, Gastric (NG, OG, PEG, GT), Daily  Correction/SSIP insulin  lispro 100 units/mL injection, 2-9 Units, Subcutaneous, 4x/day AC  D5W 250 mL flush bag, , Intravenous, Q15 Min PRN  dextrose  (GLUTOSE) 40% oral gel, 15 g, Oral, Q15 Min PRN  dextrose  50% (0.5 g/mL) injection - syringe, 12.5 g, Intravenous, Q15 Min PRN  donepezil  (ARICEPT ) tablet, 5 mg, Gastric (NG, OG, PEG, GT), NIGHTLY  electrolyte-R pH 7.4 (NORMOSOL-R  pH 7.4) premix infusion, , Intravenous,  Continuous  glucagon  injection 1 mg, 1 mg, IntraMUSCULAR, Once PRN  norepinephrine  (LEVOPHED ) 16 mg in NS 250mL premix infusion, 0-0.3 mcg/kg/min, Intravenous, Continuous  NS 250 mL flush bag, , Intravenous, Q15 Min PRN  NS flush syringe, 2-6 mL, Intracatheter, Q8HRS  NS flush syringe, 2-6 mL, Intracatheter, Q1 MIN PRN  polyethylene glycol (MIRALAX ) oral packet, 17 g, Gastric (NG, OG, PEG, GT), Daily  rosuvastatin  (CRESTOR ) tablet, 20 mg, Gastric (NG, OG, PEG, GT), QPM  sennosides (SENOKOT) 8.8mg  per 5mL oral liquid, 10 mL, Gastric (NG, OG, PEG, GT), 2x/day  tirofiban  (AGGRASTAT ) 5 mg in 100 mL premix infusion, 0.075 mcg/kg/min, Intravenous, Continuous      Allergies[1]  Past Surgical History:   Procedure Laterality Date    KNEE SURGERY           Social History[2]    Past Family History:   Family Medical History:       Problem Relation (Age of Onset)    No Known Problems  Mother, Father, Sister, Brother, Maternal Grandmother, Maternal Grandfather, Paternal Grandmother, Paternal Grandfather, Daughter, Son, Maternal Aunt, Maternal Uncle, Paternal Aunt, Paternal Uncle, Other            PHYSICAL EXAMINATION: (MUST comment on all Abnormal findings)  Appears stated age, NAD  A&Ox0  GCS 1 6 1T  Unable to assess speech  Unable to assess fund of knowledge  Unable to assess attention span & concentration  Unable to assess recent and remote memory  CN 2 PERRL  CN 3 4 6  unable to assess   CN 5 V1-V3 unable to assess   CN 7 Face grossly symmetric  CN 8 Hearing unable to assess   CN 11 Shrug unable to assess   CN 12 Tongue unable to assess   Moves RUE spontaneously, follows commands RLE, moves LLE spontaneously, minimal movement LUE   Muscle tone WNL       Current Comorbid Conditions - Neurosurgery Consult  Severe Brain Conditions: Compression of the Brain:  Yes  Treatment: ICU  Coma less than 8: Coma -Not applicable  Loss of Consciousness: yes  Encephalopathy Yes stroke  Stroke: Not applicable  Craniectomy: no  Seizure: Seizure-Not applicable  Respiratory Conditions: n/a  Renal Failure: No  Acute Blood Loss Anemia: No  N/a  Coagulopathy: Coagulopathy due to Medication:  ASA/Plavix   Acid-Base Disorders: No   N/A  Hemiplegia/Hemiparesis/Paraplegia: Hemiplegia Left Side crani watch  Fatigue/Debility: Acute Weakness.  weakness  Muscle Wasting and atrophy (specify location): n/a    Impression/Recommendations  Bridget [name] is a 89 y.o., White female with PMH dementia, R carotid stenosis/occlusion, who presents after fall s/p L hip arthroplasty and found to have tandem R ICA T/M1 occlusion s/p thrombectomy TICI3.    -- No acute neurosurgical intervention  -- Will obtain interval CT brain wo in AM to evaluate progression of stroke   -- Will follow up MRI brain wwo  -- Rest of care per primary team     -- Imaging:   -- CT brain wo (01/31/24): ORDERED   -- MRI brain wwo (01/30/24): ORDERED    Please  call with any questions or concerns.    Norman Shark, MD  PGY-2, Neurosurgery    Attending Addendum:    I saw and examined the patient.  I reviewed the resident's note.  I agree with the findings and plan of care as documented in the resident's note.  Any exceptions/additions are edited/noted.    Dietrich Samuelson C. Shlomo, MD, PhD  Department  of Neurosurgery       [1] No Known Allergies  [2]   Social History  Tobacco Use    Smoking status: Never    Smokeless tobacco: Never   Vaping Use    Vaping status: Never Used   Substance Use Topics    Alcohol use: Never    Drug use: Never

## 2024-01-31 NOTE — Consults (Signed)
 Department of Orthopaedics  H&P / Consult Note  Attending: Arlice Beers Service: Sports       Identification:  Name: Bridget [name]  Age and Gender: 89 y.o. female  Date of Birth: 1934/07/24  Date of Admission: 01/30/2024  MRN: [id number]  PCP: Deward Squibb, MD  Requesting Service: [facility]    Reason for Consult:   S/p L Hip Hemiarthroplasty on 01/29/24    SUBJECTIVE:     History Limitations: Patient is currently intubated and/or sedated.  Any information regarding HPI, ROS, past medical or surgical history, social or family history documented below has been obtained from the EMR/report/family.  Any additional history regarding this cannot be obtained presently due to their medical condition.      HPI:   Bridget [name] is a 89 y.o. White female admitted to [facility] for R ICA/MCA occlusion s/p thrombectomy and R ICA stent/balloon angioplasty. All history per chart review.     Went to outside facility ([facility]) on 01/28/2024 for displaced L FNF following a mechanical fall. Imaging revealed the above injury. Received abx (cefazolin ) and underwent L hip hemiarthroplasty. Patient developed post-operative fever of 101F and left hemiparesis. R MCA large vessel occlusion and chronic R ICA stenosis discovered and patient transferred to [facility]. Ortho consulted for in house management of L hip hemiarthroplasty.    Tube feeds started today.  Community ambulator prior to injury.   Home blood thinner use.     ALL MEDICAL HISTORY PER CHART REVIEW    PMH:  Unable to assess as patient is currently intubated   Past Medical History:   Diagnosis Date    Arthritis     HTN (hypertension)        PSH:  Unable to assess as patient is currently intubated   Past Surgical History:   Procedure Laterality Date    KNEE SURGERY       Allergies:  Unable to assess as patient is currently intubated   Allergies[1]    Medications:  Unable to assess as patient is currently intubated   Prior to Admission Medications   Prescriptions Last Dose Informant Patient  Reported? Taking?   MULTI-VITAMIN ORAL Taking Significant Other Yes Yes   Sig: Take 1 Tablet by mouth Daily   aspirin  81 mg Oral Tablet, Chewable Taking Significant Other Yes Yes   Sig: Chew 1 Tablet (81 mg total) Daily   clopidogreL  (PLAVIX ) 75 mg Oral Tablet Taking Significant Other No Yes   Sig: Take 1 Tablet (75 mg total) by mouth Once a day for 30 days   cyanocobalamin  (VITAMIN B-12) 1,000 mcg Oral Tablet Sustained Release Taking Significant Other Yes Yes   Sig: Take 1 Tablet (1,000 mcg total) by mouth Daily   donepeziL  (ARICEPT ) 10 mg Oral Tablet Taking Significant Other Yes Yes   Sig: Take 1 Tablet (10 mg total) by mouth Every evening   levothyroxine  (SYNTHROID ) 75 mcg Oral Tablet Taking Significant Other Yes Yes   Sig: Take 1 Tablet (75 mcg total) by mouth Every morning   lisinopriL -hydrochlorothiazide  (ZESTORETIC ) 20-12.5 mg Oral Tablet Taking Significant Other Yes Yes   Sig: Take 1 Tablet by mouth Daily   rosuvastatin  (CRESTOR ) 20 mg Oral Tablet Taking Significant Other No Yes   Sig: Take 1 Tablet (20 mg total) by mouth Every evening for 30 days   sertraline  (ZOLOFT ) 25 mg Oral Tablet Taking Significant Other Yes Yes   Sig: Take 1 Tablet (25 mg total) by mouth Daily  Facility-Administered Medications: None       SH:   Unable to assess as patient is currently intubated   Ambulation (prior to incident) = does not use any assistive devices      FH:  Unable to assess as patient is currently intubated     ROS:   Negative, except as noted in HPI.    OBJECTIVE    Physical Exam:          Vital signs: BP (!) 125/38   Pulse 63   Temp (!) 38.1 C (100.6 F)   Resp 16   SpO2 97%       Constitutional: Intubated    Spine:       Motor - Unable to assess as patient is currently intubated       Sensory - Unable to assess as patient is currently intubated       Withdraws to pain. Babinski downgoing.     Musculoskeletal:       RUE:  No crepitus felt on exam  No obvious deformity  No subluxations felt with PROM wrist,  elbow, shoulder  2+ U/R pulses  Unable to elicit sensory / motor exam due to intubated status       LUE:  No crepitus felt on exam  No obvious deformity  No subluxations felt with PROM wrist, elbow, shoulder  2+ U/R pulses  Unable to elicit sensory / motor exam due to intubated status       RLE:  No crepitus felt on exam   No obvious deformites  No subluxation felt with PROM ankle, knee, hip   1+ DP/PT pulses  Unable to elicit sensory / motor exam due to intubated status       LLE:  No crepitus felt on exam   No obvious deformites  No subluxation felt with PROM ankle, knee, hip   1+ DP/PT pulses  Unable to elicit sensory / motor exam due to intubated status  Incision c/d/i and re-dressed          Imaging:   OSH films:   XR Femur, L Hip, & Pelvis demonstrated displaced left femoral neck fracture    Films here:  XR Pelvis and Femur demonstrated stable L hip hemiarthroplasty without evidence of complication and stable hardware    Pertinent Labs:   Lab Results   Component Value Date    WBC 11.4 (H) 01/31/2024    HGB 9.1 (L) 01/31/2024    HGB 9.2 (L) 01/30/2024    HGB 11.5 01/30/2024    HCT 28.4 (L) 01/31/2024    PLTCNT 165 01/31/2024    INR 1.06 01/28/2024       Assessment:  89 y.o. 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 Patient's incision is c/d/i with no signs of infection.    Plan/Recommendations:   Please place Ortho Consult in Epic.  No surgical intervention by Orthopaedics  Will perform tertiary exam when awake and alert  Imaging: Post Op XR looks good (stable hardware)  Weightbearing Status: WBAT Left Lower Extremity (LLE)    PT/OT: OOB with PT/OT  DVT prophylaxis: Per Primary  Antibiotics/Boosters: Per primary  Diet: OK for diet from ortho perspective  Pain Management: Per primary team   Will discuss with staff & plan will be updated as necessary      Ceasar Flatter, MD 01/31/2024 19:30  PGY-1 Emergency Medicine  [place]  [facility]    [place]  ORTHOPAEDICS  Ortho Service: Sports   Ortho Attending: Arlice    History Limitations: Patient is currently intubated and/or sedated.  Any information regarding HPI, ROS, past medical or surgical history, social or family history documented below has been obtained from the EMR/report/family.  Any additional history regarding this cannot be obtained presently due to their medical condition.      SUBJECTIVE   89 y.o. female who is currently intubated and sedated in the [facility] for right ICA/MCA stroke s/p thrombectomy and stenting/angioplasty of right ICA.  She underwent a left hip hemiarthroplasty at [facility] on 01/03 26.  For a left femoral neck fracture.  She is currently unable to participate in an interview or exam.  Her family was at bedside.  She has mild dementia.  She does not use any ambulatory aids.    OBJECTIVE   Blood pressure (!) 125/38, pulse 63, temperature (!) 38.1 C (100.6 F), resp. rate 16, SpO2 97%.    Musculoskeletal:       BUE:   No obvious deformities present.  No crepitus on examination.  2+ U/R pulses  Unable to assess sensation and motor exam secondary to intubation         RLE:   No obvious deformities present.  No crepitus on examination.  2+ DP/PT pulses  Unable to assess sensation and motor exam secondary to intubation         LLE:   Lateral hip incision with staples in place.  Dressing CDI  2+ DP/PT pulses  Unable to assess sensation and motor exam secondary to intubation      Labs:   Hemogram   Lab Results   Component Value Date/Time    WBC 11.4 (H) 01/31/2024 04:48 AM    HGB 9.1 (L) 01/31/2024 04:48 AM    HCT 28.4 (L) 01/31/2024 04:48 AM    PLTCNT 165 01/31/2024 04:48 AM    RBC 3.34 (L) 01/31/2024 04:48 AM    MCV 85.0 01/31/2024 04:48 AM    MCHC 32.0 01/31/2024 04:48 AM    MCH 27.2 01/31/2024 04:48 AM    MPV 9.5 01/31/2024 04:48 AM        No results found for: AESR, CREAPROINFLA      Imaging:   Radiographs demonstrate a left hip hemiarthroplasty with the hardware in good  alignment.    ASSESSMENT:  89 y.o. female s/p left hip hemiarthroplasty at [facility] on 01/29/2024.    PLAN:  - No plans for any acute surgical intervention.  - Plan for suture removal in 2 weeks if patient remains in-house.  - Plan for patient to follow up with outside surgeon when discharged.  We will place a 2 week follow up with the Orthopedic Trauma if unable to coordinate care with outside surgeon.  - Will perform tertiary exam when patient extubated.    - Weightbearing: WBAT LLE  - PT/OT: Recs pending  - DVT prophylaxis: Per Primary  - Dressing: Located LLE. RN can change PRN  - Diet: OK for diet from ortho perspective  - Nursing instructions: Elevate/ice affected extremities as needed  - Dispo: No barriers to DC from ortho perspective     Please page ORTHO CALL TEAM ([place] Preferred: Ortho Consults - [facility]) for any questions / concerns regarding this patient's care.     Lamar Junes MD MPH  Resident  Department of Orthopaedic Surgery  01/31/2024 20:31      I reviewed the resident's note and discussed the findings with resident team on call.  I  agree with the plan of care.  Any exceptions/additions are edited/noted.    Charlie CHRISTELLA Overcast, MD  Department of Orthopaedics  [place]  [place]        [1] No Known Allergies

## 2024-01-31 NOTE — Nurses Notes (Signed)
 Restraint Continuation      Patient continues to have the following condition ETT.       Least restrictive alternatives attempted : concealed tubes/lines, reoriented to person, place, time, and treatment, used TV/music, and assessed meds/medical problems    Patient continues to exhibit the following behaviors: uncooperative    The restraint continued to facilitate medical/surgical treatment to ensure safety.    The patient will continue to be evaluated and assessments documented on the flowsheet to ensure that the patient is released from the restraint at the earliest possible time

## 2024-01-31 NOTE — Care Plan (Signed)
 [facility]  Rehabilitation Services  Occupational Therapy Initial Evaluation    Patient Name: Bridget [name]  Date of Birth: May 09, 1934  Height:    Weight:    Room/Bed: 05/A  Payor: AET[employer]-MEDI-ADV / Plan: [employer] MEDICARE ADVANTAGE ([place]) / Product Type: MEDICARE [place] /     Assessment:   Patient tolerates session poorly this date. Patient able to move right UE but not to command and  flaccid on the left.  Patient also with recent history of left THA.  Patient will require rehab for both the surgery and subsequent stroke to maximize independence with ADLs and mobility.  Will continue to follow.      Discharge Needs:   Equipment Recommendation: to be determined    Post-Acute Care Therapy Recommendation:     Occupational Therapy Discharge Recommendations: Intermediate levels of continued therapy with an interdisciplinary approach. The patient can tolerate, is a good candidate for, and requires post-acute care therapy for at least 5 days per week.     Deficits Requiring Further Skilled Intervention: requires assistance feeding, requires assistance with balance, requires assistance with grooming, requires assistance with dressing- upper and/ or lower extremity, requires assistance with bathing, requires assistance with toileting or bowel and bladder training, requires assistance with transfers, and requires assistance to complete IADLs    Plan:   Current Intervention: ADL retraining, IADL retraining, balance training, bed mobility training, endurance training, ROM (range of motion), strengthening, stretching, therapeutic exercise, transfer training    To provide Occupational therapy services minimum of 1x/week, until discharge.       The risks/benefits of therapy have been discussed with the patient/caregiver and he/she is in agreement with the established plan of care.       Subjective & Objective        01/31/24 0932   Therapist Pager   OT Assigned/ Phone # Duwaine DEL [phone]   Rehab Session   Document Type  evaluation   Is Session a Co-Treatment? no   OT Visit Date 01/31/24   Total OT Minutes: 10   Patient Effort poor   Symptoms Noted During/After Treatment increased pain  (Patient grabbing blankets with gentle ROM to left hip.  Patuent unable to to rate.)   General Information   Patient Profile Reviewed yes   Pertinent History of Current Functional Problem Bridget [name] is a 89 y.o. female who was currently admitted to [facility], s/p left hip replacement. She has a past medical history of dementia, hypertension, hyperlipidemia, hypothyroidism, and severe right carotid stenosis/occlusion for which she follows with vascular surgery. She also has had a right cerebellar infarct in 09/2022. This morning she had an acute neurological change for which code stroke was activated. Patient developed acute onset left sided weakness and encephalopathy and telestroke was performed with Dr. Hildegard. NIHSS 20. CT stroke protocol performed at [facility] showed severe stenosis of the right ICA origin, multifocal thrombus with severe narrowing in the right cavernous and supraclinoid ICA, multifocal thrombus in the right MCA M1 segment with near complete occlusion, and occlusion at the right ACA origin with prompt reconstitution of the right A1 segment. There is mismatch perfusion defect in the right MCA territory. She was taking Plavix  and rosuvastatin  20 mg PTA. At baseline she is ambulatory and has some mild cognitive changes, including memory loss. Patient was transferred to [facility] for mechanical thrombectomy and was taken directly to IR by neurology stroke team and [facility].   Medical Lines PIV Line;Telemetry;Foley Catheter;Femoral Line   Respiratory  Status ventilator   Existing Precautions/Restrictions full code;aspiration precautions;posterior hip precautions  (left hip)   Pre Treatment Status   Pre Treatment Patient Status Patient supine in bed;Restraints in place (specify in comments)  (bilateral soft)   Support Present Pre  Treatment  None   Communication Pre Treatment  Nurse   Mutuality/Individual Preferences   Individualized Care Needs OOB with maximove   Living Environment   Lives With spouse   Living Environment Comment Per chart, unable to get further information   Functional Level Prior   Prior Functional Level Comment Unsure of PLOF   Self-Care   Dominant Hand right   Vital Signs   Pre-Treatment Heart Rate (beats/min) 53   Post-treatment Heart Rate (beats/min) 54   Pre-Treatment Resp Rate (breaths/min) 12   Post-treatment Resp Rate (breaths/min) 10   Pre Treatment BP 133/34   Post Treatment BP 133/34   Pre SpO2 (%) 100   O2 Delivery Pre Treatment supplemental O2   Post SpO2 (%) 100   O2 Delivery Post Treatment supplemental O2   Vitals Comment On vent   Pain   Additional Documentation   (Patient with left hip pain during ROM.)   Coping/Psychosocial Response Interventions   Plan Of Care Reviewed With patient   Cognition   Behavior/Mood Observations lethargic   Orientation Status unable/difficult to assess   Attention unable/difficult to assess   Follows Commands unable/difficult to assess   RUE Assessment   RUE Assessment X- Exceptions   RUE ROM Passively WFL   RUE Strength   (Patient unable to follow directions for formal strength testing.  Patient was actively moving UE while completing ROM to LLE but unable to follow verbal or tactile cues with therapist.)   RUE Coordination   (Decreased GM/FM)   LUE Assessment   LUE Assessment X-Exceptions   LUE ROM Passively WFL   LUE Strength 0/5   LUE Tone moderately decreased tone   RLE Assessment   RLE Assessment X-Exceptions   RLE ROM Grossly intact, limited dorsiflexion   RLE Strength Patient able push a end range at 3+/5.  Patient unable to follow tactile or verbal cues.   LLE Assessment   LLE Assessment X-Exceptions   LLE Other Gentle ROM completed to left hip.  Patient positioned will in bed without internal rotation.  Patient did show pain response with ROM. No active movement  noted.   Therapeutic Exercise/Activity   Comment Gentle cervical ROM to the right   Post Treatment Status   Post Treatment Patient Status Patient supine in bed;Restraints in place (specify in comments)  (bilateral soft retraints)   Support Present Post Treatment  None   Care Plan Goals   OT Rehab Goals Bathing Goal;Bed Mobility Goal;Grooming Goal;LB Dressing Goal;Toileting Goal;Transfer Training Goal   Bathing Goal   Bathing Goal, Date Established 01/31/24   Bathing Goal, Time to Achieve by discharge   Bathing Goal, Activity Type all bathing tasks   Bathing Goal, Independence Level moderate assist (50% patient effort)   Bed Mobility Goal   Bed Mobility Goal, Date Established 01/31/24   Bed Mobility Goal, Time to Achieve by discharge   Bed Mobility Goal, Activity Type all bed mobility activities   Bed Mobility Goal, Independence Level moderate assist (50% patient effort)   Grooming Goal   Grooming Goal, Date Established 01/31/24   Grooming Goal, Time to Achieve by discharge   Grooming Goal, Activity Type all grooming tasks   Grooming Goal, Independence  moderate assist (50% patient effort)  LB Dressing Goal   LB Dressing Goal, Date Established 01/31/24   LB Dressing Goal, Time to Achieve by discharge   LB Dressing Goal, Activity Type all lower body dressing tasks   LB Dressing Goal, Independence Level moderate assist (50% patient effort)   Toileting Goal   Toileting Goal, Date Established 01/31/24   Toileting Goal, Time to Achieve by discharge   Toileting Goal, Activity Type all toileting tasks   Toileting Goal, Independence Level moderate assist (50% patient effort)   Transfer Training Goal   Transfer Training Goal, Date Established 01/31/24   Transfer Training Goal, Time to Achieve by discharge   Transfer Training Goal, Activity Type all transfers   Transfer Training Goal, Independence Level moderate assist (50% patient effort)   Planned Therapy Interventions, OT Eval   Planned Therapy Interventions ADL  retraining;IADL retraining;balance training;bed mobility training;endurance training;ROM (range of motion);strengthening;stretching;therapeutic exercise;transfer training   Functional Impairment   Overall Functional Impairments/Problem List balance impaired;coordination impaired;endurance;postural control impaired;strength decreased   Clinical Impression   Functional Level at Time of Session Patient tolerates session poorly this date. Patient able to move right UE but not to command and  flaccid on the left.  Patient also with recent history of left THA.  Patient will require rehab for both the surgery and subsequent stroke to maximize independence with ADLs and mobility.  Will continue to follow.   Criteria for Skilled Therapeutic Interventions Met (OT) yes   Rehab Potential limited   Therapy Frequency minimum of 1x/week   Predicted Duration of Therapy until discharge   Anticipated Equipment Needs at Discharge to be determined   Daily Activity AM-PAC/6-clicks Score   Putting on/Taking off clothing on lower body 1   Bathing 1   Toileting 1   Putting on/Taking off clothing on upper body 1   Personal grooming 1   Eating Meals 1   Raw Score Total 6   Standardized (t-scale) Score 17.07   CMS 0-100% Score 100   CMS Modifier CN   Evaluation Complexity Justification   Occupational Profile Review Brief history   Clinical Decision Making Low analytic complexity   Evaluation Complexity Low       Therapist:   Duwaine Fenton, OT   Phone #: [phone]

## 2024-01-31 NOTE — Nurses Notes (Signed)
.  Restraint Continuation      Patient continues to have the following condition ams.       Least restrictive alternatives attempted : Increase patient observation, moved closer to the nurses station, concealed tubes/lines, decreased/removed stimulus, frequent orientation, involved patient in conversation, provided familiar/personal items, reoriented to person, place, time, and treatment, encouraged relaxation techniques, offered food/fluids, redirected behavior, used TV/music, and assessed meds/medical problems    Patient continues to exhibit the following behaviors: uncooperative and withdrawn    The restraint continued to facilitate medical/surgical treatment to ensure safety.    The patient will continue to be evaluated and assessments documented on the flowsheet to ensure that the patient is released from the restraint at the earliest possible time   Grey Schlauch A Charlyn Vialpando, RN

## 2024-01-31 NOTE — Nurses Notes (Signed)
 Restraint Initiation    Patient assessed and found to have the following condition; intubated/recovering from anesthesia/CVA        Least restrictive alternatives attempted: Increase patient observation, bed-exit alarm/motion detector, call light accessible, concealed tubes/lines, decreased/removed stimulus, frequent orientation, involved patient in conversation, provided familiar/personal items, encouraged relaxation techniques, redirected behavior, used TV/music, and assessed meds/medical problems.    Patient is exhibiting the following behaviors: agitated and pulling at lines/tubes.    The reason for application of restraint: patient attempting to remove artificial airway, patient attempting to pull at IV lines, patient attempting to pull at invasive medical tubes, and intermittent break through RASS> -2    The restraint was applied to facilitate medical/surgical treatment to ensure safety.    The patient will continue to be evaluated and assessments documented on the flowsheet to ensure that the patient is released from the restraint at the earliest possible time.

## 2024-01-31 NOTE — Care Management Notes (Signed)
 [facility]  Care Management Initial Evaluation    Patient Name: Bridget [name]  Date of Birth: [birth year]  Sex: female  Date/Time of Admission: 01/30/2024  9:54 AM  Room/Bed: 05/A  Payor: AETNA-MEDI-ADV / Plan: AETNA MEDICARE ADVANTAGE (MC) / Product Type: MEDICARE MC /   Primary Care Providers:  Clarisse Mt, MD, MD (General)    Pharmacy Info:   Preferred Pharmacy       [phone] [place] [place] [facility] [address] [facility] [address]    [address] [place] [address]    Phone: [phone] Fax: [phone]    Hours: Not open 24 hours          Emergency Contact Info:   Extended Emergency Contact Information  Primary Emergency Contact: Wolke,EDWARD  Address: [address]           [place], [place] [address] [place]  Home Phone: [phone]  Work Phone: [phone]  Mobile Phone: [phone]  Relation: Husband  Preferred language: English  Interpreter needed? No  Secondary Emergency Contact: [facility]  Address: [address]           [place], [place] [address] [place]  Home Phone: [phone]  Work Phone: [phone]  Mobile Phone: [phone]  Relation: Daughter  Preferred language: English  Interpreter needed? No    History:   Bridget [name] is a 89 y.o., female, admitted for acute ischemic right MCA Stroke     Height/Weight:   /       LOS: 1 day   Admitting Diagnosis: Acute ischemic right MCA stroke (CMS [facility]) [I63.511]    Assessment:      01/31/24 1435   Assessment Details   Assessment Type Admission   Date of Care Management Update 01/31/24   Date of Next DCP Update 03-Mar-2024   Readmission   Is this a readmission? No   Insurance Information/Type   Insurance type Medicare   Employment/Financial   Patient has Prescription Coverage?  Yes  [facility] [place]        Name of Insurance Coverage for Medications Aetna Medicare Adv   Financial/Environmental Concerns none   Living Environment   Select an age group to open lives  with row.  Adult   Lives With spouse   Living Arrangements house   Able to Return to Prior Arrangements yes   Home Safety   Home Assessment: No Problems Identified   Home Accessibility no concerns   Custody and Legal Status   Do you have a court appointed guardian/conservator? No   Care Management Plan   Discharge Planning Status initial meeting   Projected Discharge Date 02/07/24   Discharge plan discussed with: Spouse  (son Krystal)   CM will evaluate for rehabilitation potential yes   Discharge Needs Assessment   Outpatient/Agency/Support Group Needs   (no current HH)   Equipment Currently Used at Home none   Equipment Needed After Discharge none   Discharge Facility/Level of Care Needs Undetermined at this time   Transportation Available car;family or friend will provide   Referral Information   Admission Type inpatient   Address Verified verified-no changes   Arrived From acute hospital, other   Acute Care Facility   [facility])   ADVANCE DIRECTIVES   Does the Patient have an Advance Directive? Yes, Patient Does Have Advance Directive for Healthcare Treatment   Type of Advance Directive Completed *Appointed Health Care Surrogate   Copy  of Advance Directives in Chart? Yes, Copy on Chart.(Specify in Comment Which Advance Directive)   Name of MPOA or Healthcare Surrogate Bridget [name]   Phone Number of MPOA or Healthcare Surrogate [phone]   Patient Requests Assistance in Having Advance Directive Notarized. N/A   LAY CAREGIVER    Appointed Lay Caregiver? I Decline     Pt admitted from outside hospital. Per chart review, pt admitted for acute ischemic right MCA stroke.  Consulted neurosurgery,ortho, and physiatry.  Post op day 1 S/p IR stroke therapy.  Labs are being monitored. Receiving IV fluids, PT, OT, and imaging ordered.    DCP met with the spouse and son at bedside to complete the initial assessment.   Pt lives in a house with with husband Dallas and plans to return at discharge  Address and  phone number: confirmed    Dallas and son Krystal will be assisting the pt after discharge.   Pt has needed utilities.   0 steps outside   0 stairs within home   Pt able to stay on the main floor of the home.   Transportation at d/c: Husband Dallas and son La [place]  Health insurance: [employer] Adv , confirmed.  Prescription coverage: Aetna Medicare Adv    [facility] [place]    Medications are affordable; patient and spouse manages medications by pill planner.  Name of PCP/clinic: [facility], last appointment was a mo ago.   Pt was independent prior to admission; pt was not active with homecare agency/caregivers.   Pt uses the following DME: none.   Pt is not on dialysis.  HCS: #1- Name: Dallas Reader   Phone Number:[phone] , #2- Name:  Phone Number   . Copy is in Epic or located in chart.  Pt has a MPOA but family did not bring a copy with them.  Dallas is the MPOA as well.   Lay caregiver appointed: declined (name and phone number)          Discharge Plan:  Undetermined at this time      The patient will continue to be evaluated for developing discharge needs.     Case Manager: Rollene Estefana Levers  Phone: [phone]

## 2024-01-31 NOTE — Care Plan (Signed)
 01/31/24 9287   Therapist Pager   SLP Assigned/Phone # speech [phone]   Rehab Session   Document Type rehab contact note     Pt intubated per chart review, will defer swallow evaluation until extubation.

## 2024-01-31 NOTE — Progress Notes (Signed)
 INPATIENT NEUROCRITICAL PROGRESS NOTE      Bridget [name] is a 89 y.o. female admitted to the NCCU service for  R MCA Stroke sp MT.    History of Present Illness:   Per medical record This is an 89 year old female with a past medical history significant for hypertension, hyperlipidemia, CVA, who presents to the emergency department secondary to left hip pain after falling.  Patient states she was at [employer] and when she went to get out of the car she slipped and fell and subsequently was unable to get up.  She denies hitting her head.  She denies loss of consciousness.  She complained of left foot and knee pain in ED however denied this on exam.  She denies chest pain, shortness for breath, dizziness, lightheadedness, nausea, vomiting, diarrhea, urinary symptoms, fever, chills, and abdominal pain.  She is alert and oriented and able to answer all questions appropriately.     Blood work performed in ED revealed a WBC of 13.3, with remaining labs unremarkable.  Chest x-ray negative for acute cardiopulmonary process.  Left femur x-ray notes displaced left femoral neck fracture.  Left foot x-ray notes suspected nondisplaced fracture of the distal 2nd proximal phalanx, extending to the PIP joint.  Left hip x-ray notes displaced left femoral neck fracture.  Left knee x-ray notes degenerative changes without acute bony abnormality.  CT brain and CT cervical spine negative for acute process.  CT chest/abdomen/pelvis notes mildly impacted subcapital left femoral neck fracture.  Additionally there is a small amount of fluid seen in the endometrial cavity, recommend nonemergent pelvic ultrasound by radiologist.  Scattered thyroid  nodules noted as well with recommended outpatient ultrasound of the thyroid  if indicated.  EKG revealed sinus rhythm with first-degree AV block with occasional PVCs.  Right bundle branch block.  Left anterior fascicular block.  Orthopedic was consulted in ED and seen patient at bedside. CT  Stroke protocol at [facility] ED denotes R ICA stenosis and diminished flow in territory of R MCA. Pt to tsf to [facility] for MT attempt.    Date of Admission:  01/30/2024  Length of Stay:  1  Admission Source:  Transfer  Advance Directives:  None-Discussed  Hospice involvement prior to admission?  Not applicable     Subjective:     Seen at bedside. Remains intubated and mechanically ventilated. Able to follow simple commands but no eye opening to voice or noxious stimuli.     Interval Events:    01/30/24: Admitted to the NCCU. Underwent mechanical thrombectomy.   01/31/24: NAEO. Patient hypotensive requiring norepinephrine  gtt. Starting Tube feeds. Remains intubated for mental status. 24hr scan showing small new hemorrhage in right insula.       Inpatient Medications:   acetaminophen  (TYLENOL ) tablet, 650 mg, Gastric (NG, OG, PEG, GT), Q6H PRN  aspirin  chewable tablet 81 mg, 81 mg, Gastric (NG, OG, PEG, GT), Daily  clopidogrel  (PLAVIX ) 75 mg tablet, 75 mg, Gastric (NG, OG, PEG, GT), Daily  Correction/SSIP insulin  lispro 100 units/mL injection, 2-9 Units, Subcutaneous, 4x/day AC  D5W 250 mL flush bag, , Intravenous, Q15 Min PRN  dextrose  (GLUTOSE) 40% oral gel, 15 g, Oral, Q15 Min PRN  dextrose  50% (0.5 g/mL) injection - syringe, 12.5 g, Intravenous, Q15 Min PRN  donepezil  (ARICEPT ) tablet, 5 mg, Gastric (NG, OG, PEG, GT), NIGHTLY  electrolyte-R pH 7.4 (NORMOSOL-R  pH 7.4) premix infusion, , Intravenous, Continuous  glucagon  injection 1 mg, 1 mg, IntraMUSCULAR, Once PRN  norepinephrine  (LEVOPHED ) 16 mg in NS  250mL premix infusion, 0-0.3 mcg/kg/min, Intravenous, Continuous  NS 250 mL flush bag, , Intravenous, Q15 Min PRN  NS flush syringe, 2-6 mL, Intracatheter, Q8HRS  NS flush syringe, 2-6 mL, Intracatheter, Q1 MIN PRN  polyethylene glycol (MIRALAX ) oral packet, 17 g, Gastric (NG, OG, PEG, GT), Daily  rosuvastatin  (CRESTOR ) tablet, 20 mg, Gastric (NG, OG, PEG, GT), QPM  sennosides (SENOKOT) 8.8mg  per 5mL oral liquid, 10 mL, Gastric  (NG, OG, PEG, GT), 2x/day  tirofiban  (AGGRASTAT ) 5 mg in 100 mL premix infusion, 0.075 mcg/kg/min, Intravenous, Continuous        Objective:     Most Recent Vital Signs:  Vital Signs Range Most Recent   Temperature Temp  Avg: 37.6 C (99.7 F)  Min: 36.8 C (98.2 F)  Max: 38.8 C (101.8 F) Temperature: 37.1 C (98.8 F)   Heart Rate Pulse  Avg: 62.2  Min: 42  Max: 107 Heart Rate: 66   Blood Pressure BP  Min: 85/76  Max: 138/53 BP (Non-Invasive): (!) 123/43   Respiration Resp  Avg: 14.5  Min: 9  Max: 46 Respiratory Rate: 15   SpO2 SpO2  Avg: 98.4 %  Min: 91 %  Max: 100 % SpO2: 100 %     Physical Exam:      Gen: No apparent distress     HENT:  Normocephalic, atraumatic.  Sclera anicteric.  No oropharyngeal lesions.     CV: Regular rate and rhythm. No murmurs or rubs.      Pulmonary: Normal work of breathing.     Psych: Appropriate mood & affect.     Neurological Exam:      Mental Status Exam:     Level of Consciousness:        GCS - Eye:  1 - No eye opening      GCS - Verbal:  1T - intubated, unable to assess      GCS - Motor:  6 - Following commands; follows in the RUE and RLE. Withdraws in the LUE and LLE.    Attention:  Does not track examiner, but follows simple commands.   Language:  Unable to assess due to intubation   Speech: Unable to assess due to intubation     Cranial Nerves:    CN2:  Pupils equal, briskly reactive to light., Unable to assess due to patient cooperation   CN3/4/6:  Gaze midline   CN5:  Symmetric at rest, but otherwise unable to assess due to patient cooperation.   CN7:  Facial activation symmetric.   CN8:  Hearing grossly intact.   CN9-12:  Unable to assess due to intubation or patient cooperation.     Musculoskeletal:    Strength:   Right Arm:  Unable to test due to patient cooperation.   Left Arm:  Unable to test due to patient cooperation.   Right Leg:  Unable to test due to patient cooperation.   Left Leg:  Unable to test due to patient cooperation.     Sensation: Responds to noxious  stimuli bilaterally.     Coordination:  Unable to assess due to patient cooperation.     Pertinent Labs:  Complete Blood Count  Recent Labs     01/31/24  0448   WBC 11.4*   HGB 9.1*   PLTCNT 165    Metabolic Panel  Recent Labs     01/31/24  0448   SODIUM 138   POTASSIUM 4.0   CHLORIDE 109   CO2 23   BUN  20   CREATININE 0.92   CALCIUM  8.0*   MAGNESIUM  1.9   PHOSPHORUS 2.5      Liver Function Test  Recent Labs     01/28/24  1859 01/30/24  1138   ALT 21 9   AST 27 28   TOTBILIRUBIN 0.6  --    ALBUMIN 3.7  --    ALKPHOS 86  --     Miscellaneous Labs  Recent Labs     01/30/24  1138   LDLCHOL 43   HA1C 5.5   TSH 1.451         Pertinent Imaging Studies:    CT Brain wo 1/5:   Wet read: Evolving large R MCA infarct with petechial hemorrhages and new focal hemorrhage in R insular region. Stable midline shift of 4 mm.     MRI Brain w/wo (01/04):   Acute infarct involving the right middle cerebral artery territory (principally the superior division) involving the right basal ganglia, right insula, and right frontal lobe. There is predominantly petechial type hemorrhage within the infarcted territory. There is mild right to left midline shift at the level the foramen of Monroe    CT Stroke protocol 1/4:   Diminutive right ICA with severely asymmetrically diminished flow within the right MCA territory. 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 Recommend MRI for further evaluation.  Groundglass opacities within the bilateral lung apices. This may represent developing infectious or inflammatory process.      Neurologic:       Ischemic Stroke Labs:  Lab Results   Component Value Date    LDLCHOL 43 01/30/2024        Acute Ischemic Stroke complicated by hemorrhagic conversion:  Location R MCA.  NIHSS 20.  TPA not given due to recent surgery..  Mechanical Thrombectomy performed with TICI  3 flow.  Neurological exam notable for the following:  1/1/1T, though not reversed from anesthesia.  Neurological exam Improved, GCS 1/6/1T.  Able to follow commands in Right extremities with withdrawal on the left.    Monitoring   Continue q1h Neurochecks & Vitals, STAT Head CT for changes in exam  Strict NPO until passes a dysphagia screen  Diagnostics:  Labs:  A1c 5.5%, Lipid Panel with HDL low, total cholesterol 97, TSH 1.451  Carotid Imaging:  CT-angiogram demonstrated R ICA Stenosis.  Repeat CT scan at 24h to monitor for hemorrhagic conversion - demonstrating new focal hemorrhage of R insula  Surface Echocardiogram:  ordered   MRI brain:  R MCA infarct with petechial hemorrhages and mild midline shift.   Therapeutics:  Antithrombotic Therapy:  Dual antiplatelet therapy with Aspirin  81mg  and Clopidogrel  75mg  daily. Will start given has stent in place and current hemorrhage small. Plan for additional CT brain tomorrow for monitoring.   Statin Therapy:  High-intensity therapy with Rosuvastatin  20mg  daily.  Continue Physical, Occupational, and Speech Therapy      Cardiovascular:     Status:   Hypotensive overnight without arrhythmias.  Vitals:  Heart Rate Pulse  Avg: 62.2  Min: 42  Max: 107 Heart Rate: 66   Blood Pressure BP  Min: 85/76  Max: 138/53 BP (Non-Invasive): (!) 123/43   Cardiac Labs:  No results found for: TROPONIN, TROPONINI, TROPONIINI, BNP     Shock, undifferentiated   Neuroscience blood pressure goals:  per ICH guidelines, strict SBP control 100-140.    Use Hydralazine /Labetalol  per parameters.   Currently Requiring Vasopressor Support with Norepinephrine , wean as tolerated  Continue current antihypertensive regimen:  hold home medications unless hypertensive.  Admission Troponin/EKG:  193.2  Surface Echocardiogram:  ordered       Pulmonary:     Status:  Acute hypoxic respiratory failure.  Oxygen Requirement:  Mode: SPONT  Set PEEP: 5 cmH2O  Pressure Support: 10 cmH2O  FiO2: 35 %  I:E Ratio: 1:2.8    Most Recent Arterial Blood Gas:  Lab Results   Component Value Date    PH 7.41 01/30/2024    PCO2 45 01/30/2024    PO2 81 (L)  01/30/2024        Intubated for Mechanical Thrombectomy. Remaining intubated post-procedure due to mental status.   Continue Incentive Spirometry  Maintain SpO2 >90%  Wean ventilator as tolerated and medically ready    Gastrointestinal     Most recent LFTs:  Lab Results   Component Value Date    AST 28 01/30/2024    ALT 9 01/30/2024    ALKPHOS 86 01/28/2024    INR 1.06 01/28/2024      Diet:  starting tube feeds today  GI PPx:  Famotidine  20mg  bid  Bowel Regimen:  Miralax  17g daily (hold for loose stools) and Senna 8.6mg  daily (hold for loose stools)    Hepatic Steatosis   Evidenced on CT CAP w/ contrast (01/28/24)  No acute concern      Renal:   Status:  No evidence of acute kidney injury.  Continue to monitor I/Os.  Most recent Renal Labs:  Lab Results   Component Value Date    SODIUM 138 01/31/2024    POTASSIUM 4.0 01/31/2024    BUN 20 01/31/2024    CREATININE 0.92 01/31/2024    CALCIUM  8.0 (L) 01/31/2024    MAGNESIUM  1.9 01/31/2024    PHOSPHORUS 2.5 01/31/2024      IV Fluids:  Plasmalyte at 75 ml/h --> d/c given starting tube feeds   Resupplement electrolytes prn    Hematology:     Most recent Hematology Labs:  Lab Results   Component Value Date    HGB 9.1 (L) 01/31/2024    MCV 85.0 01/31/2024    MCH 27.2 01/31/2024    PLTCNT 165 01/31/2024      Acute Normocytic Anemia, suspected etiology from acute blood loss   Hemoglobin dropping from 11.5 to 9.2 after admission and post-procedure. Favoring acute blood loss anemia from procedure  Hemoglobin:  Hemoglobin stable currently  Platelet Count:  Platelets adequate.  DVT Prophylaxis:  SCDs, anticoagulation held due to thrombectomy. Plan for resuming 48hr post procedure  Tirofiban  started postop with transition to DAPT per NIR  D/C tirofiban  as transitioning to DAPT.    Free Fluid within Endometrial Cavity  Seen on CT CAP w/ contrast (01/28/24)  No acute concern  Radiology read recommending non-emergent pelvic ultrasound. Would plan for after discharge       Endocrine:        Most recent Endocrine Labs:  Lab Results   Component Value Date    GLUIP 108 01/30/2024    GLUIP 108 01/30/2024    GLUIP 142 (H) 01/30/2024    GLUIP 116 (H) 01/30/2024      Neuroscience Glycemic Goals < 180  Glycemic regimen:  No history of diabetes.  Continue Insulin  Sliding Scale.    Thyroid  Nodules   Scattered Thyroid  Nodules seen on CT CAP w/ contrast (01/28/24)   Plan for outpatient workup with Thyroid  Ultrasound following discharge     Infectious Disease:     Status:  Afebrile, downtrending leukocytosis  Temperature  Profile:  Temperature Temp  Avg: 37.6 C (99.7 F)  Min: 36.8 C (98.2 F)  Max: 38.8 C (101.8 F) Temperature: 37.1 C (98.8 F)     WBC Count  Lab Results   Component Value Date    WBC 11.4 (H) 01/31/2024    WBC 8.7 01/30/2024    WBC 12.3 (H) 01/30/2024    PMNS 83.2 01/30/2024    LYMPHO 6.7 01/30/2024      Monitor for signs and symptoms of infection     Other Systems:     Recent Left Femoral Neck Fracture s/p Left Hip Hemiarthroplasty 01/29/2024  Procedure performed at outside hospital.   Consult ortho for postop management recs    Lines/Drains/Access:  Patient Lines/Drains/Airways Status       Active Line / Dialysis Catheter / Dialysis Graft / Drain / Airway / Wound       Name Placement date Placement time Site Days    Peripheral IV Proximal;Right Basilic  (medial side of arm) 01/30/24  0900  -- less than 1    Peripheral IV Left;Posterior Dorsal Metacarpals  (top of hand) 01/30/24  1100  -- less than 1    Arterial Line Left Femoral 01/30/24  1014  Femoral  less than 1    Foley Catheter 01/30/24  1900  -- less than 1    EndoTracheal Tube Cuffed;Oral 7.0 22 Lip 01/30/24  1005  -- less than 1    Wound  Incision Left;Outer Thigh 01/29/24  1032  -- 1    Wound  Incision Anterior;Proximal;Right Groin 01/30/24  1900  -- less than 1                     Emergency Contact:  Extended Emergency Contact Information  Primary Emergency Contact: Hearld,EDWARD  Address: [address]            [place], [place] [address] [place]  Home Phone: [phone]  Work Phone: [phone]  Mobile Phone: [phone]  Relation: Husband  Preferred language: English  Interpreter needed? No  Secondary Emergency Contact: [facility]  Address: [address]           [place], [place] [address] [place]  Home Phone: [phone]  Work Phone: [phone]  Mobile Phone: [phone]  Relation: Daughter  Preferred language: English  Interpreter needed? No     Restraints:  Bilateral soft wrist restraints.  I have assessed this patient and find the continuation of restraints appropriate due to tendency to pull at life-sustaining lines that will result in direct and/or immediate injury if dislodged.  I will recommend their removal as soon as the patient can reliably follow commands and not pull at these lines.  Skin:  intact.  Activity:  Out of bed to chair when feasible  Disposition:  Remain in NSICU due to ventilatory support.       Code Status:  FULL CODE     Edsel Bruce, MD  01/31/2024, 08:58     Neurocritical Care attending note findings and recommendations:    The patient is a 89 y.o. year old female with past medical history of recent left hip replacement, HTN, HLD, hypothyroidism, right carotid stenosis who presents to the NCCU for acute ischemic stroke.     Attending Exam findings: No eye opening. Spontaneous on the right. Withdraws in the LUE/LLE.    Systems Based Interventions:    1. Neurologic  Acute Ischemic Stroke, Right MCA occlusion  - clinically with left  hemiparesis  - CTSP with severe right ICA stenosis, right MCA occlusion  - s/p thrombectomy 1/4 with right ICA stent & balloon angioplasty  - etiology of stroke - large vessel disease   - CT brain 1/5 personally reviewed with evolving right MCA infarct and petechial hemorrhage  - there is imaging evidence of cerebral edema and brain compression  - ASA/Plavix  continued given stent placement   - Crestor  20 mg daily   - TTE pending  - MRI brain  completed 1/4 confirming right MCA territory infarct with petechial hemorrhagic transformation  - PT/OT/SS    2. Cardiac  Shock  - norepi to meet MAP goal > 65  - Troponins 193, trend   - EKG normal sinus rhythm  - TTE pending    3. Respiratory  Respiratory Insufficiency  - intubated for airway protection in the setting of altered mental status    Ventilator Settings:  Mode: SPONT  Set PEEP: 5 cmH2O  Pressure Support: 10 cmH2O  FiO2: 35 %  I:E Ratio: 1:2.8    4. Gastrointestinal  - tube feeds: start today  - Pepcid  20 mg BID for GI ppx     5. Renal  - Normosol at 75 mL/h   - Foley catheter in place since 1/4, required for accurate I&Os    6. Hematological  - DVT chemoprophylaxis held x48h due to ICH     7. Endocrine  - maintain sliding scale insulin  to assess insulin  needs    Hypothyroidism  - Synthroid  75 mcg    8. Infectious Disease  - monitor for signs of infection    Otherwise Plan as Above in Resident or Midlevel's Note.     Critical Care Attestation   I have reviewed the resident/ midlevel's note.  I agree with their findings and/or have made additions/ edits as well as my findings above.  I was present at the bedside of this critically ill patient exclusive of procedures that are documented elsewhere.  My services were independent and non-duplicative of other practioners of other specialties (non-Neurocritical Care).  This patient suffers from failure or dysfunction of Neurological system(s).   The care of this patient was in regard to managing (a) conditions(s) that has a high probability of sudden, clinically significant, or life-threatening deterioration and required a high degree of Attending Physician attention and direct involvement to intervene urgently. Data review and care planning was performed in direct proximity of the patient, examination was obviously performed in direct contact with the patient. All of this time was exclusive of procedure which will be documented elsewhere in the chart.   My  critical care time involved full attention to the patient's condition and included:   Review of nursing notes and/or old charts   Review of medications, allergies, and vital signs   Documentation time   Consultant collaboration on findings and treatment options   Care, transfer of care, and discharge plans   Ordering, interpreting, and reviewing diagnostic studies/ lab tests   Obtaining necessary history from family, EMS, nursing home staff and/or treating physicians   My critical care time did not include time spent teaching resident physician(s) or other services of resident physicians, or performing other reported procedures.     Total Critical Care Time: 31 minutes    Lorn Fuller, MD  01/31/2024, 06:44

## 2024-01-31 NOTE — Nurses Notes (Signed)
 Notified NP Prentice Pop of pt's heart rate dipping to 30's at times.

## 2024-01-31 NOTE — Progress Notes (Signed)
 [facility]  Neurosurgery Consult  Follow Up Note    Manuel, Lawhead, 89 y.o. female  Date of Admission: 01/30/2024  Date of Service: 01/31/2024  Date of Birth: 06/03/1934    Chief Complaint: AMS, L hemiparesis  Subjective: NAEO, resting in bed    Vital Signs:  Temp  Avg: 37.6 C (99.7 F)  Min: 36.8 C (98.2 F)  Max: 38.8 C (101.8 F)  BP  Min: 85/76  Max: 138/53  ART BP  Min: 83/71  Max: 164/45  Pulse  Avg: 62.2  Min: 42  Max: 107  Resp  Avg: 14.5  Min: 9  Max: 46  SpO2  Avg: 98.4 %  Min: 91 %  Max: 100 %    Today's Physical Exam:  General: Appears stated age, NAD    Neuro Exam:  General: Alert and Oriented x0  GCS: Eyes 1, Motor 6, Verbal 1T  CNs:  CN 2 PERRL  CN 3 4 6  UTA  CN 5 V1-V3 UTA  CN 7 Face symmetric  CN 8 Hearing grossly intact  CN 11 UTA  CN 12 UTA  Mental Status:  UTA speech  UTA fund of knowledge  UTA attention span & concentration  UTA recent and remote memory  Neuromuscular:  Follows commands RLE  WD LLE  No movement BUE      Labs Ordered / Reviewed:  Reviewed:  Recent Labs     01/30/24  1138 01/31/24  0448   SODIUM 138 138     Recent Labs     01/30/24  1138 01/31/24  0448   POTASSIUM 3.9 4.0     Recent Labs     01/30/24  1138 01/31/24  0448   CREATININE 0.93 0.92     Recent Labs     01/30/24  1138 01/31/24  0448   MAGNESIUM  1.6* 1.9     Recent Labs     01/30/24  1138 01/31/24  0448   PHOSPHORUS 2.9 2.5     Recent Labs     01/30/24  1138 01/31/24  0448   WBC 8.7 11.4*     Recent Labs     01/30/24  1138 01/31/24  0448   HGB 9.2* 9.1*     Recent Labs     01/30/24  1138 01/31/24  0448   PLTCNT 145* 165     Recent Labs     01/28/24  1859   PROTHROMTME 11.0     Recent Labs     01/28/24  1859   INR 1.06     Recent Labs     01/28/24  1859   APTT 29.1       Assessment/ Plan:  Aveen Stansel Kamps is a 89 y.o., White female who presents with PMH dementia, R carotid stenosis/occlusion, who presents after fall s/p L hip arthroplasty and found to have 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     -- No acute neurosurgical intervention  -- Will review imaging from this morning and update plan accordingly  -- Rest of care per primary team      -- Imaging:                -- CT brain wo (01/31/24): Done                -- MRI brain wwo (01/30/24): Done    Saaman Ghodsi, MD  PGY-1, Neurosurgery    Attending Addendum:    I saw  and examined the patient.  I reviewed the resident's note.  I agree with the findings and plan of care as documented in the resident's note.  Any exceptions/additions are edited/noted.    Danne Vasek C. Shlomo, MD, PhD  Department of Neurosurgery

## 2024-01-31 NOTE — Care Plan (Signed)
 [facility]  Respiratory Care Services   Care Plan    Patient Name:  Bridget [name], Bridget [name]  Date of Service:  01/31/2024  Hospital Day:     LOS: 1 day       Ventilator Settings:  Mode: SPONT  Set PEEP: 5 cmH2O  Pressure Support: 10 cmH2O  FiO2: 35 %  I:E Ratio: 1:1.3         Blood Gas Results:  No results found for this encounter  Goal Outcome Evaluation:     Individualized Care Needs: Manage vent as needed. (01/31/24 1603)  Patient-Specific Goals (Include Timeframe): Wait to see if pt becomes more apporpiate to follow before removing ETT (01/31/24 1603)      Patient Progress: no change      Summary/Plan of Care:  PT remains intubated on above settings at this time. Plan to manage vent as needed. Waiting for pt to become alert in order to extubate.       Vickye Aloe, RT  01/31/2024    Problem: Mechanical Ventilation Invasive  Goal: Effective Communication  Outcome: Ongoing (see interventions/notes)  Goal: Optimal Device Function  Outcome: Ongoing (see interventions/notes)  Goal: Mechanical Ventilation Liberation  Outcome: Ongoing (see interventions/notes)  Goal: Optimal Nutrition Delivery  Outcome: Ongoing (see interventions/notes)  Goal: Absence of Device-Related Skin and Tissue Injury  Outcome: Ongoing (see interventions/notes)  Goal: Absence of Ventilator-Induced Lung Injury  Outcome: Ongoing (see interventions/notes)  Intervention: Prevent Ventilator-Associated Pneumonia  Recent Flowsheet Documentation  Taken 01/31/2024 1603 by Vickye ORN, RT  Head of Bed [facility]) Positioning: HOB elevated  Taken 01/31/2024 1056 by Vickye ORN, RT  Head of Bed [facility]) Positioning: HOB elevated  Taken 01/31/2024 0749 by Vickye ORN, RT  Head of Bed (HOB) Positioning: HOB elevated     Problem: Mechanical Ventilation Invasive  Goal: Effective Communication  Outcome: Ongoing (see interventions/notes)  Goal: Optimal Device Function  Outcome: Ongoing (see interventions/notes)  Goal: Mechanical Ventilation Liberation  Outcome: Ongoing (see  interventions/notes)  Goal: Optimal Nutrition Delivery  Outcome: Ongoing (see interventions/notes)  Goal: Absence of Device-Related Skin and Tissue Injury  Outcome: Ongoing (see interventions/notes)  Goal: Absence of Ventilator-Induced Lung Injury  Outcome: Ongoing (see interventions/notes)  Intervention: Prevent Ventilator-Associated Pneumonia  Recent Flowsheet Documentation  Taken 01/31/2024 1603 by Vickye ORN, RT  Head of Bed [facility]) Positioning: HOB elevated  Taken 01/31/2024 1056 by Vickye ORN, RT  Head of Bed [facility]) Positioning: HOB elevated  Taken 01/31/2024 0749 by Vickye ORN, RT  Head of Bed [facility] Positioning: HOB elevated

## 2024-01-31 NOTE — Nurses Notes (Signed)
 Restraint Continuation      Patient continues to have the following condition altered mental status related to intubation/CVA.       Least restrictive alternatives attempted : call light accessible, concealed tubes/lines, decreased/removed stimulus, involved patient in conversation, provided familiar/personal items, reoriented to person, place, time, and treatment, ambulated in the halls, and encouraged relaxation techniques    Patient continues to exhibit the following behaviors: agitated    The restraint continued to facilitate medical/surgical treatment to ensure safety.    The patient will continue to be evaluated and assessments documented on the flowsheet to ensure that the patient is released from the restraint at the earliest possible time

## 2024-01-31 NOTE — Pharmacy (Signed)
 Pharmacy Medication Reconciliation    Patient Name: Bridget [name], Bridget [name]  Date of Service: 01/31/2024  Date of Admission: 01/30/2024  Date of Birth: [birth year]  Length of Stay:   1 day   Service: NEURO CRITICAL CARE      Transitions of Care:  Discharge Pharmacy services were discussed with the patient and the Meds to [facility] flowsheet and preferred pharmacy information were updated in EMR if applicable and able to assess with patient.    Information was collected from:  Pharmacy and Caregiver  Celestia (husband) [phone]    [id number] [place] - [address] [facility] [address]  [address]  [place] [address]  Phone: [phone] Fax: [phone]      Does the Patient have Prescription Coverage?      Clarified Prior to Admission Medications:  Prior to Admission medications   Medication Sig Taking Resumed Y/N (RPh) Comments   amLODIPine  (NORVASC ) 5 mg Oral Tablet TAKE 1 TABLET BY MOUTH EVERY DAY. STOP TAKING THE AMLODIPINE  2.5MG      Husband stated was not on the list of medications he had    aspirin  81 mg Oral Tablet, Chewable Chew 1 Tablet (81 mg total) Daily Yes    Most recent pharmacy fill 09/10/2023 for 3 months    clopidogreL  (PLAVIX ) 75 mg Oral Tablet Take 1 Tablet (75 mg total) by mouth Once a day for 30 days Yes    Most recent pharmacy fill 01/02/2024 for 3 months    cyanocobalamin  (VITAMIN B-12) 1,000 mcg Oral Tablet Sustained Release Take 1 Tablet (1,000 mcg total) by mouth Daily Yes       donepeziL  (ARICEPT ) 10 mg Oral Tablet Take 1 Tablet (10 mg total) by mouth Every evening Yes    Most recent pharmacy fill 01/23/2024 for 3 months    levothyroxine  (SYNTHROID ) 75 mcg Oral Tablet Take 1 Tablet (75 mcg total) by mouth Every morning Yes    Most recent pharmacy fill 12/22/2023 for 3 months    lisinopriL -hydrochlorothiazide  (ZESTORETIC ) 20-12.5 mg Oral Tablet Take 1 Tablet by mouth Daily Yes    Most recent pharmacy fill 01/26/2024 for 3 months    MULTI-VITAMIN ORAL Take 1 Tablet by  mouth Daily Yes       risedronate (ACTONEL) 150 mg Oral Tablet Take 1 Tablet (150 mg total) by mouth Per Instructions PLEASE SEE ATTACHED FOR DETAILED DIRECTIONS     Husband not sure if patient is still taking; was not on the list of medications he had   rosuvastatin  (CRESTOR ) 20 mg Oral Tablet Take 1 Tablet (20 mg total) by mouth Every evening for 30 days Yes    Most recent pharmacy fill 12/20/2023 for 3 months    sertraline  (ZOLOFT ) 25 mg Oral Tablet Take 1 Tablet (25 mg total) by mouth Daily Yes    Most recent pharmacy fill 11/25/2023 for 3 months        Did patient's home medication list require updates? Yes    Medications UPDATED on Prior to Admission Med List:  -Donepezil  5 mg take 1 tablet by mouth every night to Donepezil  10 mg take 1 tablet by mouth every evening   -Multi-vitamin take by mouth to Multi-vitamin take 1 tablet by mouth daily     Medications ADDED to Prior to Admission Med List:  -None     Allergies:    No Known Allergies    Cranford Scarce, Pharmacy Technician    Did pharmacist make suggestions for medication reconciliation?  No    Medications REMOVED from home medication list:  Risedronate and amlodipine     Pharmacist Recommendations:  None    Reyes CHRISTELLA Slovak, PHARMD

## 2024-01-31 NOTE — Care Plan (Signed)
 Shift Summary  Norepinephrine  infusion rate was increased twice and then decreased once during the shift in response to blood pressure and MAP values.   Mechanical and soft restraints were continued throughout the shift due to persistent restlessness and confusion, with regular assessment and removal for range of motion and skin checks.   Emotional support was provided and family was updated on the plan of care.   Rosuvastatin  and donepezil  were administered as scheduled.   Patient remained intubated, with bedrest and significant mobility impairment noted.   Overall, patient remained hemodynamically supported and safety measures were maintained throughout the shift.     Alternative methods tried prior to restraints: Mechanical and soft restraints were continued throughout the shift due to persistent restlessness and confusion, with documentation indicating intermittent breakthrough agitation; no alternative methods were documented prior to restraint use.     Patient free from injury and discomfort: No hematoma was present and device skin pressure protection was maintained; range of motion was performed and restraints were removed for assessment as scheduled.     Autonomy maintained at the highest possible level: Independence was encouraged and BADL routines maintained, but patient was unable to participate in bedside report or verbalize needs due to obtunded status and confusion.     Need for restraints reassessed per policy: Restraint need was reassessed every two hours and restraints were removed for assessment as required.     Patient education provided: Care was explained and choices provided, and family/support system was updated on the daily plan of care.     Goal Outcome Evaluation:     Anxieties, Fears or Concerns: unable to voice (01/30/24 2000)  Individualized Care Needs: neuro checks (01/30/24 2000)  Patient-Specific Goals (Include Timeframe): extubate when ready (01/30/24 2000)  Plan of Care Reviewed With:  son, spouse (01/30/24 2000)     Patient Progress: no change

## 2024-01-31 NOTE — Consults (Addendum)
 Physical Medicine & Rehabilitation Consult Service      Patient name: Bridget [name]  Patient age: 89 y.o.  Reason for Consult: evaluation for post-acute care  Today's Date: 01/31/2024  Date of Admission: 01/30/2024       Rehabilitation Diagnosis:  Left Hemiparesis  R MCA large vessel occlusion  Left Hip Fracture  S/p L hip hemiarthroplasty.  Gait Abnormality  ADL Impairment    Precautions: fall risk, wound management    Assessment & Plan:      This is a 89 y.o. year old female who is seen in consultation following admission to [facility] for R ICA/MCA occlusion s/p thrombectomy and R ICA stent/balloon angioplasty.     Husband noted on 01/28/2024 suffered  displaced L FNF following a mechanical fall. Underwent L hip hemiarthroplasty. At [facility] . Patient developed post-operative fever of 101F and New left hemiparesis. Found to have R MCA large vessel occlusion and chronic R ICA stenosis , patient transferred to [facility]. Now s/p  Right ICA/T occlusion 1 pass stroke thrombectomy with Right ICA stent/balloon angioplasty with TICI3 recanalization. Patient intubated and monitored in [facility]. Patient fully Independent prior to recent events.        Disposition: The patient has medical complexity and goals with physical, speech  and occupational therapy may benefit from acute inpatient rehabilitation(TBD).  Our goal will be continued patient/family education and training while improving functional mobility and independence with ADLs.  Recommend continued therapies as able, while the primary team completes workup and manages the patient.        Please contact the rehabilitation service with any additional concerns, if the patient's discharge needs change, or if we can be of other assistance.  Thank you for the consultation    Alm Quivers, MD        Pre-Admission Screening Components:      Expected level of improvment     Estimated length of stay  TBD   Expected frequency/duration of treatment    Anticipated discharge destination Home or  community setting   Anticipated post-discharge treatments    Other relevant information              History of Present Illness:      Bridget [name] is a 89 y.o. year old female  admitted to [facility] for R ICA/MCA occlusion s/p thrombectomy and R ICA stent/balloon angioplasty.     Husband noted on 01/28/2024 suffered  displaced L FNF following a mechanical fall. Underwent L hip hemiarthroplasty. At [facility] . Patient developed post-operative fever of 101F and New left hemiparesis. Found to have R MCA large vessel occlusion and chronic R ICA stenosis discovered and patient transferred to [facility].. Now s/p  Right ICA/T occlusion 1 pass stroke thrombectomy with Right ICA stent/balloon angioplasty with TICI3 recanalization. Patient intubated and monitored in [facility]. Patient fully Independent prior to recent events.          Allergies:  Allergies[1]        Prior Level of Function and Social History   Ambulatory: Yes, without assistive devices  ADL's: Independent  Driving: Yes  Assistive Devices: None  Who helps at home: Spouse, 5 STE        Current Level Of Function:   Has been followed by PT/OT during their acute hospitalization.  Noted to be:      min with last visit on     Total OT Minutes:: 10 min with last visit on OT Visit Date: 01/31/24  Utilizing                                                     6 Clicks Raw Score total: 6           Physical Exam:   Vitals:  Vitals:    01/31/24 1530 01/31/24 1600 01/31/24 1603 01/31/24 1700   BP: (!) 153/51 (!) 134/43  (!) 123/40   Pulse: 64 64 67 67   Resp: 20 (!) 26  (!) 24   Temp:  (!) 38.1 C (100.6 F)     SpO2:   100% 100%          There is no height or weight on file to calculate BMI.     Gen: Intubated, Non responsive , oriented ?, NAD  HEENT: PERRL  Neck: Supple, no lymphadenopathy  Cardiovascular:  Adequate capillary refills bilateral upper extremities   Lungs: Non-labored breathing, on Vent  Abdomen: Soft, non-tender, non-distended  Skin: no rashes    Ext:  Full PROM bilateral extremities.  No significant edema  MS: Unable to assess    Root Right Left   Shoulder Abduction C4     Elbow Flexion C5     Elbow Extension C7     Wrist Extension C6     Finger Flexion C8     Finger Abduction T1     Hip Flexion L2     Hip Abduction      Knee Flexion L3/4     Knee Extension L3     Dorsiflexion L4     Plantarflexion S1     EHL Extension L5        Neuro:  Cranial Nerves Cranial Nerves unable to be assessed   Babinski Plantar Reflex is downgoing    Hoffman Negative   Upper Extremity Tone Normal   Lower Extremity Tone Normal   Upper Extremity Sensation    Lower Extremity Sensation    Memory/Concentration              Intake/Output Summary:    Intake/Output Summary (Last 24 hours) at 01/31/2024 1710  Last data filed at 01/31/2024 1600  Gross per 24 hour   Intake 2155.37 ml   Output 1140 ml   Net 1015.37 ml               Lab:  CBC Results Differential Results   Recent Labs     01/31/24  0448   WBC 11.4*   HGB 9.1*   HCT 28.4*   PLTCNT 165    No results found for this or any previous visit (from the past 30 hours).                   [1] No Known Allergies

## 2024-01-31 NOTE — Care Plan (Signed)
 Respiratory Care Services  ICU Care Plan    Patient Name:  Bridget [name], Bridget [name]  Date of Service:  01/31/2024  Hospital Day:     LOS: 1 day       Ventilator Settings:  Mode: SPONT  Set PEEP: 5 cmH2O  Pressure Support: 10 cmH2O  FiO2: 35 %    Blood Gas Results:  Recent Labs     01/30/24  0637   PH 7.41   PCO2 45   PO2 81*   BICARBONATE 27.5   BASEEXCESS 3.3*       Summary/Plan of Care:    Patient remains intubated with 7.0 ETT 22@lip  and stable on the above PSV settings. Patient has clear/diminished bilateral breath sounds. Suctioned for scant, clear/cloudy sputum. No changes made overnight.          Sueo Cullen O'Haver, RTR  01/31/2024        Goal Outcome Evaluation:     Anxieties, Fears or Concerns: (not recorded)  Individualized Care Needs: (not recorded)  Patient-Specific Goals (Include Timeframe): extubate when ready (01/31/24 0511)  Plan of Care Reviewed With: patient (01/31/24 9488)     Patient Progress: no change

## 2024-01-31 NOTE — Care Plan (Signed)
 Medical Nutrition Therapy Assessment        SUBJECTIVE : Pt on vent, on 1 pressor (0.08), may extubate today. Plan to start tube feeds today. Per rounds, pt likely would still need TF after extubation.    OBJECTIVE:     Current Diet Order/Nutrition Support:  MNT PROTOCOL FOR DIETITIAN  ADULT TUBE FEED - BOLUS TID NO MEALS, TF ONLY; OSMOLITE 1.5; OG; BOLUS; Bolus Amount: 1 can (237 mL)  DIET NPO - NOW EXCEPT TUBE FEEDS, EXCEPT MEDICATIONS ONLY VIA ENTERIC TUBE     Height Used for Calculations: 160 cm (5' 2.99)  Weight Used For Calculations: 72.6 kg (160 lb 0.9 oz)  BMI (kg/m2): 28.42  BMI Assessment: BMI 25-29.9: overweight  Ideal Body Weight (IBW) (kg): 52.7  % Ideal Body Weight: 137.76  Adjusted/Standard Body Weight  Adjusted BW: 57.68 kg          Estimated Needs:    If remains intubated,  Calorie: 1450 kcal/day (25 kcal/57.68 kg Adjusted wt)  Protein: 79-90 gm/day (1.5-1.7 gm protein/52.7 kg Ideal wt)    If extubated  Energy Calorie Requirements: 1550-1750 kcal/day (27-30 kcal/57.68 kg Adj wt) p  Protein Requirements (gms/day): 63-79 gm/day (1.2-1.5 gm/52.7 kg Ideal wt)     Comments: 89 y.o. female admitted for R MCA Stroke sp MT.  HbA1c 5.5% (01/30/2024).     Plan/Interventions :   Start tube feeds Osmolite 1.5 at 1 can TID. Monitor tube feed tolerance and advance as appropriate.    If remains intubated, recommend tube feeds goal at 3.5 cans a day (1.5 can at 0800, 1 can at 1200, 1600) with Osmolite 1.5 plus 2 Prosource TF / day.  Provides: 1420 cal = 25 cals/kg                 93 g protein = 1.8 g/kg                 640 ml free water  -     If extubated, unable to advance diet and require tube feeds, recommend tube feed goal at 4.5 cans a day (1.5 can at 0800, 1200, 1600, 2000) with Osmolite 1.5.  Provides: 1620 cal = 28 cals/kg                68 g protein = 1.3 g/kg                823 ml free water  -     Monitor tube feeds tolerance. HOB >30-45 degree during feeds.   Monitor bowel regularity.  Trend  weights.    Nutrition Diagnosis: Inability to swallow related to Patient on vent as evidenced by Need for TF    Bridget [name], RD     Goal Outcome Evaluation:     Anxieties, Fears or Concerns: (not recorded)  Individualized Care Needs: (not recorded)  Patient-Specific Goals (Include Timeframe): initiate TF when appropriate (01/31/24 1100)  Plan of Care Reviewed With: (not recorded)

## 2024-01-31 NOTE — Care Management Notes (Signed)
 Does the Patient have an Advance Directive?: Yes, Patient Does Have Advance Directive for Healthcare Treatment  Type of Advance Directive Completed: *Appointed Health Care Surrogate  Copy of Advance Directives in Chart?: Yes, Copy on Chart.(Specify in Comment Which Advance Directive)  Name of MPOA or Healthcare Surrogate: Keith Cancio  Phone Number of MPOA or Healthcare Surrogate: [phone]  Patient Requests Assistance in Having Advance Directive Notarized.: Not Applicable    DCP met with the pt, spouse Dallas ,and son Krystal at bedside.  Appointed husband Dallas to be patients HCS.  Dallas and son Krystal stated that Dallas is the patients MPOA, they just do not have the paperwork with them.  Pt and Dallas have been married for 64 years.  DCP will continue to follow up with pt's discharge needs.

## 2024-01-31 NOTE — Consults (Signed)
 [facility]  Neurosurgery Consult  Follow Up Note    Bridget [name], Bridget [name], 89 y.o. female  Date of Admission: 01/30/2024  Date of Service: 01/31/2024  Date of Birth: 08/11/1934    Chief Complaint: AMS, L hemiparesis  Subjective: Remains intubated. Exam deteriorated compared to yesterday.    Vital Signs:  Temp  Avg: 37.5 C (99.5 F)  Min: 37 C (98.6 F)  Max: 38.1 C (100.6 F)  BP  Min: 100/34  Max: 172/46  ART BP  Min: 104/45  Max: 164/45  Pulse  Avg: 60.3  Min: 42  Max: 80  Resp  Avg: 15.7  Min: 9  Max: 28  SpO2  Avg: 99.7 %  Min: 94 %  Max: 100 %    Today's Physical Exam:  General: Appears stated age, NAD    Neuro Exam:  General: Alert and Oriented x0  GCS: Eyes 1, Motor 4, Verbal 1T  CNs:  CN 2 PERRL  CN 3 4 6  UTA  CN 5 V1-V3 UTA  CN 7 Face symmetric  CN 8 Hearing grossly intact  CN 11 UTA  CN 12 UTA  Mental Status:  UTA speech  UTA fund of knowledge  UTA attention span & concentration  UTA recent and remote memory  Neuromuscular:  WD BUE R>L  TF BLE        Labs Ordered / Reviewed:  Reviewed:  Recent Labs     01/30/24  1138 01/31/24  0448   SODIUM 138 138     Recent Labs     01/30/24  1138 01/31/24  0448   POTASSIUM 3.9 4.0     Recent Labs     01/30/24  1138 01/31/24  0448   CREATININE 0.93 0.92     Recent Labs     01/30/24  1138 01/31/24  0448   MAGNESIUM  1.6* 1.9     Recent Labs     01/30/24  1138 01/31/24  0448   PHOSPHORUS 2.9 2.5     Recent Labs     01/30/24  1138 01/31/24  0448   WBC 8.7 11.4*     Recent Labs     01/30/24  1138 01/31/24  0448   HGB 9.2* 9.1*     Recent Labs     01/30/24  1138 01/31/24  0448   PLTCNT 145* 165     No results for input(s): PROTHROMTME in the last 72 hours.    No results for input(s): INR in the last 72 hours.    No results for input(s): APTT in the last 72 hours.      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     -- Given poor exam and worsening of mass effect and MLS on CT brain, will discuss surgical intervention (Decompressive hemicraniectomy) with family this AM.  -- Rest of care per primary team      -- Imaging:    -- CT brain wo (01/31/24): Done (on review, worsening edema and MLS).                -- CT brain wo (01/31/24): Evolving R MCA infarct, stable MLS and petechial hemorrhage                -- MRI brain wwo (01/30/24): R  MCA acute infarct      Jorie Mani, MD  PGY5, Neurosurgery    Addendum:  -- Called and discussed above things with HCS(Husband) and daughter. Explained in detail about nature of the problem and need for decompressive hemicraniectomy. Explained risks vs benefits and prognosis with expected recovery.  -- After discussion among family members, HCS voiced understanding of the above explained things and elected not to proceed with surgery given patients expected quality of life and prior wishes.  -- Informed the attending.      Jorie Mani, MD  PGY5, Neurosurgery    Attending Addendum:    I saw and examined the patient.  I reviewed the resident's note.  I agree with the findings and plan of care as documented in the resident's note.  Any exceptions/additions are edited/noted.    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  Family does NOT wish to pursue [facility] given prior discussions with patient/understanding of patients expected QoL and prior wishes.    Nandan Willems C. Shlomo, MD, PhD  Department of Neurosurgery

## 2024-02-01 ENCOUNTER — Inpatient Hospital Stay (HOSPITAL_COMMUNITY)

## 2024-02-01 LAB — CBC
HCT: 25.9 % — ABNORMAL LOW (ref 34.8–46.0)
HGB: 8.4 g/dL — ABNORMAL LOW (ref 11.5–16.0)
MCH: 27.6 pg (ref 26.0–32.0)
MCHC: 32.4 g/dL (ref 31.0–35.5)
MCV: 85.2 fL (ref 78.0–100.0)
MPV: 10.2 fL (ref 8.7–12.5)
RBC: 3.04 x10ˆ6/uL — ABNORMAL LOW (ref 3.85–5.22)
RDW-CV: 14.6 % (ref 11.5–15.5)
WBC: 11 x10ˆ3/uL (ref 3.7–11.0)

## 2024-02-01 LAB — MAGNESIUM: MAGNESIUM: 2.2 mg/dL (ref 1.8–2.6)

## 2024-02-01 LAB — BASIC METABOLIC PANEL
ANION GAP: 4 mmol/L (ref 4–13)
BUN/CREA RATIO: 28 — ABNORMAL HIGH (ref 6–22)
BUN: 22 mg/dL (ref 8–25)
CALCIUM: 7.5 mg/dL — ABNORMAL LOW (ref 8.6–10.3)
CHLORIDE: 107 mmol/L (ref 96–111)
CO2 TOTAL: 24 mmol/L (ref 23–31)
CREATININE: 0.79 mg/dL (ref 0.60–1.05)
GLUCOSE: 190 mg/dL — ABNORMAL HIGH (ref 65–125)
POTASSIUM: 3.9 mmol/L (ref 3.5–5.1)
SODIUM: 135 mmol/L — ABNORMAL LOW (ref 136–145)

## 2024-02-01 LAB — PHOSPHORUS: PHOSPHORUS: 1.3 mg/dL — ABNORMAL LOW (ref 2.3–4.0)

## 2024-02-01 LAB — POC BLOOD GLUCOSE (RESULTS): GLUCOSE, POC: 166 mg/dL — ABNORMAL HIGH (ref 65–125)

## 2024-02-01 LAB — IONIZED CALCIUM WITH PH
IONIZED CALCIUM: 1.12 mmol/L — ABNORMAL LOW (ref 1.15–1.33)
PH (VENOUS): 7.44 — ABNORMAL HIGH (ref 7.32–7.43)

## 2024-02-01 MED ORDER — SODIUM PHOSPHATE 3 MMOL/ML INTRAVENOUS SOLUTION
10.0000 mmol | Freq: Once | INTRAVENOUS | Status: AC
  Administered 2024-02-01: 0 mmol via INTRAVENOUS
  Administered 2024-02-01: 10 mmol via INTRAVENOUS
  Filled 2024-02-01: qty 3.33

## 2024-02-01 MED ORDER — MIDAZOLAM 1 MG/ML INJECTION WRAPPER
2.0000 mg | Freq: Once | INTRAMUSCULAR | Status: AC
  Administered 2024-02-01: 2 mg via INTRAVENOUS
  Filled 2024-02-01: qty 2

## 2024-02-01 MED ORDER — MORPHINE 2 MG/ML INTRAVENOUS SYRINGE: 2.0000 mg | INJECTION | INTRAVENOUS | Status: DC | PRN

## 2024-02-01 MED ORDER — GLYCOPYRROLATE 0.2 MG/ML INJECTION SOLUTION
200.0000 ug | Freq: Once | INTRAMUSCULAR | Status: AC
  Administered 2024-02-01: 200 ug via INTRAVENOUS
  Filled 2024-02-01: qty 1

## 2024-02-01 MED ORDER — MORPHINE 2 MG/ML INTRAVENOUS SYRINGE
2.0000 mg | INJECTION | INTRAVENOUS | Status: DC | PRN
  Administered 2024-02-01 – 2024-02-03 (×7): 2 mg via INTRAVENOUS
  Filled 2024-02-01 (×7): qty 1

## 2024-02-01 MED ORDER — GLYCOPYRROLATE 0.2 MG/ML INJECTION SOLUTION
200.0000 ug | INTRAMUSCULAR | Status: DC | PRN
  Administered 2024-02-02 – 2024-02-04 (×5): 200 ug via INTRAVENOUS
  Filled 2024-02-01 (×5): qty 1

## 2024-02-01 MED ORDER — HYDRALAZINE 20 MG/ML INJECTION SOLUTION: 10.0000 mg | Freq: Four times a day (QID) | INTRAMUSCULAR | Status: DC | PRN

## 2024-02-01 MED ORDER — LABETALOL 5 MG/ML INTRAVENOUS SOLUTION
5.0000 mg | INTRAVENOUS | Status: DC | PRN
  Administered 2024-02-01 (×4): 5 mg via INTRAVENOUS
  Filled 2024-02-01: qty 20

## 2024-02-01 MED ORDER — MIDAZOLAM 1 MG/ML INJECTION WRAPPER: 2.0000 mg | INTRAMUSCULAR | Status: DC | PRN

## 2024-02-01 MED ORDER — CALCIUM GLUCONATE 100 MG/ML (10 %) INTRAVENOUS SOLUTION
1000.0000 mg | Freq: Once | INTRAVENOUS | Status: AC
  Administered 2024-02-01: 1000 mg via INTRAVENOUS
  Filled 2024-02-01: qty 10

## 2024-02-01 NOTE — Ancillary Notes (Signed)
 [facility]  Spiritual Care Note    Patient Name:  Bridget [name] [facility]  Date of Encounter:   02/01/2024       02/01/24 1110   Clinical Encounter Type   Reason for Visit Ritual   Referral From Chaplain-initiated   Declined Spiritual Care Visit At This Time No   Visited With Patient   Visted by Volunteer Chaplain: Yes   Name Fr Arley   Date of Visit 02/01/24   Patient Spiritual Encounters   Ritual Sacrament of the Sick   Time of Encounters   Start Time 1045   Stop Time 1110   Duration (minutes) 25 Minutes       Bridget [name], [facility]  Pager: [phone]  Total Time of Encounter: 25 min.

## 2024-02-01 NOTE — Respiratory Therapy (Signed)
 Pt extubated to CMO status per family request. RN and RT in attendance for extubation.

## 2024-02-01 NOTE — Ancillary Notes (Signed)
 [facility]  Spiritual Care Note    Patient Name:  Farin Buhman [facility]  Date of Encounter:   02/01/2024    I visited Ms. Townsend and her family following their decision to go CMO. Per their request, I made a referral to Fr. Arley for [employer]. Following his visit, no other spiritual needs were expressed. Family understands that chaplain support remains available 24/7 for any future spiritual needs.        02/01/24 1125   Clinical Encounter Type   Reason for Visit Death Anticipated   Referral From RN/LPN   Declined Spiritual Care Visit At This Time Yes   Visited With Spouse;Other (Comment)   (three other family members)   Visted by [employer]: No   Patient Spiritual Encounters   Spiritual Needs/Issues Actively Dying   Spiritual/Coping Resources Loved/supported by family   End-of-Life Issues Imminent Death   Coping Provided supportive presence   Other Hydrologist Provided Non-anxious presence   Family Spiritual Encounters   Spiritual Assessment Grief   Spiritual/Coping Resources Beliefs helpful in coping;Beliefs in God/Sacred/Higher Purpose;Gratitude   End-of-Life Issues Imminent Death   Coping  Offered empathy;Provided supportive presence   Support Services Provided Non-anxious presence   Information Provided Spiritual Care scope of service   Spiritual Care Outcomes with Family   Spiritual/Emotional Processing Spiritual Care relationship established   Family Coping No change   Values/Beliefs/Spiritual Care   Additional Documentation No   Time of Encounters   Start Time 1120   Stop Time 1125   Duration (minutes) 5 Minutes         Damien Cunning, CHAPLAIN  Pager: [phone]  Total Time of Encounter: 5 min.

## 2024-02-01 NOTE — Consults (Signed)
 [facility]  Supportive/Palliative Care Medicine Consult    Date of Service:  02/01/2024    Requesting MD:  Dr. Tomie   Reason for Consult:  Pain/symptom management and Discussion of hospice    Assessment & Recommendations:  encounter for palliative care for adult failure to thrive secondary to right MCA stroke s/p thrombectomy with hemorrhagic conversion, mass effect, and midline shift     Spoke with family - patient's husband Dallas and daughter Randine - at bedside this afternoon.  They confirmed goals of care are comfort focused.  She did have some signs of discomfort immediately post-extubation earlier today but report she has been comfortable since medication administration about one hour prior to our exam.  I provided support for decision to focus on comfort.  She has not expressed any end of life preferences in the past and family does not feel that home would be feasible at this time.  We discussed that if moving out of the ICU, would recommend 7W for ongoing comfort focused care.  I briefly introduced inpatient hospice service and discussed when this would be recommended.  Plan to continue with her medications as they are for now, and we discussed that we would monitor closely and make changes when needed to maintain comfort.  Family expressed their appreciation for compassionate care that they had received from her teams.      -continue comfort focused treatments   -continue with morphine  2 mg every 2 hours as needed.  She seemed to respond well to dose given prior to my exam.  If symptoms increasing, can decrease PRN frequency to every 30 minutes.  If no improvement with two doses, increase dose by 50-100%  -continue midazolam  2 mg every 4 hours as needed.  Frequency can be decreased to every 30 minutes as needed depending on symptom need  -continue glycopyrrolate  200 mcg every 4 hours as needed for secretions   -continue acetaminophen  650 mg every 6 hours as needed for fever   -if transferring  out of the ICU, recommend to transfer to 7W    Thank you for consulting supportive care.  We will continue to follow     Decision Making Capacity:  No    Advance Directives prior to consultation:  MPOA- No;  Living Will- No; Healthcare Surrogate:  Yes, Name:  Bridget [name] (husband) and Phone Number [phone]    Information Obtained from: spouse, daughter, and history reviewed via medical record    HPI/Discussion:  Bridget [name] is an 89 yo female with history of HTN, HLD, CVA who presented to [facility] ED on 1/2 after a fall.  [place] and a place they visited weekly.  Workup revealed displaced left femoral neck fracture which was operatively repaired on 1/3.  Post-operatively, she had a decline in mentation and was found to have right MCA stroke on 1/4 with large vessel occlusion.  She was transferred to [facility] on 1/4 and underwent thrombectomy with neuro-IR.  Follow up imaging on the morning of 16/ with expanding hemorrhage and worsening midline shift and mass effect.  Her exam deteriorated and was not longer following commands or having purposeful movement.  After discussion with neurosurgery regarding craniectomy, family decided not to pursue this and elected to change goals of care to DNR/DNI and comfort focused treatments with compassionate extubation.  Supportive care was consulted on 1/6 for symptom management and hospice discussion.      Past Family,  Medical, and Social History and ROS see H&P on 01/30/24 by Dr. Claudene     Medical orders prior to consultation: Post - POST form not discussed DNR Card - No  Code Status:  No CPR  Treatment Limitations prior to consultation:  No antibiotics, No bipap, No dialyisis, No hyperalimentation, No IV fluid, No lab studies, No routine diagnositc tests, No transfer to ICU/Stepdown, No tube feedings, No vasopressors, No x-rays, and Do not intubate      Exam:  Temperature: 36.6 C (97.8 F)  Heart Rate:  64  BP (Non-Invasive): (!) 150/53  Respiratory Rate: (!) 21  SpO2: 90 %    General: resting in bed, in no distress, vital signs reviewed   Head: Normocephalic, atraumatic   Eyes: pupils equal and round, conjunctiva clear  ENT:  Mucus membranes moist, no visible oral lesions  Respiratory:  breathing comfortable on room air   Neurology: unresponsive to voice     Ventilator Settings: N/A  HFNC Settings:N/A  BIPAP Settings:N/A  Automatic Implantable Cardiac Defibrillator  No    Labs:  I have reviewed all lab results.    Imaging Studies:  Images and Reports reviewed to current date.    Consultant:  Corean Lais, MD    Medical Decision Making:    Chronic problem with severe exacerbation, progression, or treatment side effects OR Problem posing a threat to life or bodily function:  R MCA stroke with hemorrhagic conversion with mass effect and midline shift     Risk of morbidity:  managing parenteral controlled substances    Corean Lais, MD 02/01/2024, 15:02  Assistant Professor, Geriatrics and Palliative Medicine

## 2024-02-01 NOTE — Care Plan (Signed)
 [facility]  Respiratory Care Services   Care Plan    Patient Name:  Bridget [name], Bridget [name]  Date of Service:  02/01/2024  Hospital Day:     LOS: 2 days       Ventilator Settings:  Mode: SPONT  Set PEEP: 5 cmH2O  Pressure Support: 10 cmH2O  FiO2: 35 %  I:E Ratio: 1.2:1        Blood Gas Results:  Recent Labs     02/01/24  0010   PH 7.44*       Summary/Plan of Care:  PT remains intubated on above settings at this time. Plan to manage vent as needed. Waiting for pt to become alert in order to extubate.       Morna Hover, RT  02/01/2024    Problem: Stroke, Ischemic (Includes Transient Ischemic Attack)  Goal: Optimal Coping  Outcome: Ongoing (see interventions/notes)  Goal: Effective Oxygenation and Ventilation  Outcome: Ongoing (see interventions/notes)  Intervention: Optimize Oxygenation and Ventilation  Recent Flowsheet Documentation  Taken 02/01/2024 0432 by Morna SAILOR, RT  Head of Bed [facility]) Positioning: HOB elevated  Taken 01/31/2024 2329 by Morna SAILOR, RT  Head of Bed [facility]) Positioning: HOB elevated  Taken 01/31/2024 1915 by Morna SAILOR, RT  Head of Bed [facility]) Positioning: HOB elevated     Problem: Adult Inpatient Plan of Care  Goal: Absence of Hospital-Acquired Illness or Injury  Outcome: Ongoing (see interventions/notes)  Goal: Optimal Comfort and Wellbeing  Outcome: Ongoing (see interventions/notes)     Problem: Mechanical Ventilation Invasive  Goal: Effective Communication  Outcome: Ongoing (see interventions/notes)  Goal: Mechanical Ventilation Liberation  Outcome: Ongoing (see interventions/notes)     Problem: Stroke, Ischemic (Includes Transient Ischemic Attack)  Goal: Effective Oxygenation and Ventilation  Outcome: Ongoing (see interventions/notes)  Intervention: Optimize Oxygenation and Ventilation  Recent Flowsheet Documentation  Taken 02/01/2024 0432 by Morna SAILOR, RT  Head of Bed [facility]) Positioning: HOB elevated  Taken 01/31/2024 2329 by Morna SAILOR, RT  Head of Bed [facility]) Positioning: HOB elevated  Taken 01/31/2024  1915 by Morna SAILOR, RT  Head of Bed [facility]) Positioning: HOB elevated     Problem: Mechanical Ventilation Invasive  Goal: Effective Communication  Outcome: Ongoing (see interventions/notes)

## 2024-02-01 NOTE — Consults (Signed)
 Physical Medicine and Rehabilitation Consultation:    Reviewed Dr. Maryjo note on 02/01/24 and patient's family decision for comfort focused care. We will sign off.    Gyl Buncombe, PAC  PMR    Alm Quivers, MD

## 2024-02-01 NOTE — Ancillary Notes (Signed)
 [facility]  Spiritual Care Note    Patient Name:  Bridget [name] [facility]  Date of Encounter:   02/01/2024    I visited [name] and her family per Epic referral. When I arrived, two family members were present, and they declined spiritual care at this time. Chaplain support remains available 24/7 for any future spiritual needs.        02/01/24 1320   Clinical Encounter Type   Reason for Visit Epic Consult   Referral From Physician   Declined Spiritual Care Visit At This Time Yes   Visited With Spouse;Daughter   Visted by Volunteer Chaplain: No   Patient Spiritual Encounters   Spiritual Needs/Issues Actively Dying   Spiritual/Coping Resources Loved/supported by family   End-of-Life Issues Imminent Death   Coping Provided supportive presence   Other Support Services Provided Non-anxious presence   Family Spiritual Encounters   Spiritual Assessment Grief   Spiritual/Coping Resources Beliefs helpful in coping;Beliefs in God/Sacred/Higher Purpose;Supportive family system   End-of-Life Issues Imminent Death   Coping  Provided supportive presence   Support Services Provided Non-anxious presence   Information Provided Spiritual Care scope of service   Spiritual Care Outcomes with Family   Family Coping No change   Values/Beliefs/Spiritual Care   Additional Documentation No   Time of Encounters   Start Time 1315   Stop Time 1320   Duration (minutes) 5 Minutes         Damien Cunning, CHAPLAIN  Pager: [phone]  Total Time of Encounter: 5 min.

## 2024-02-01 NOTE — Progress Notes (Signed)
 INPATIENT NEUROCRITICAL PROGRESS NOTE      Bridget [name] is a 89 y.o. female admitted to the NCCU service for  R MCA Stroke sp MT.    History of Present Illness:   Per medical record This is an 89 year old female with a past medical history significant for hypertension, hyperlipidemia, CVA, who presents to the emergency department secondary to left hip pain after falling.  Patient states she was at [employer] and when she went to get out of the car she slipped and fell and subsequently was unable to get up.  She denies hitting her head.  She denies loss of consciousness.  She complained of left foot and knee pain in ED however denied this on exam.  She denies chest pain, shortness for breath, dizziness, lightheadedness, nausea, vomiting, diarrhea, urinary symptoms, fever, chills, and abdominal pain.  She is alert and oriented and able to answer all questions appropriately.     Blood work performed in ED revealed a WBC of 13.3, with remaining labs unremarkable.  Chest x-ray negative for acute cardiopulmonary process.  Left femur x-ray notes displaced left femoral neck fracture.  Left foot x-ray notes suspected nondisplaced fracture of the distal 2nd proximal phalanx, extending to the PIP joint.  Left hip x-ray notes displaced left femoral neck fracture.  Left knee x-ray notes degenerative changes without acute bony abnormality.  CT brain and CT cervical spine negative for acute process.  CT chest/abdomen/pelvis notes mildly impacted subcapital left femoral neck fracture.  Additionally there is a small amount of fluid seen in the endometrial cavity, recommend nonemergent pelvic ultrasound by radiologist.  Scattered thyroid  nodules noted as well with recommended outpatient ultrasound of the thyroid  if indicated.  EKG revealed sinus rhythm with first-degree AV block with occasional PVCs.  Right bundle branch block.  Left anterior fascicular block.  Orthopedic was consulted in ED and seen patient at bedside. CT  Stroke protocol at [facility] ED denotes R ICA stenosis and diminished flow in territory of R MCA. Pt to tsf to [facility] for MT attempt.    Date of Admission:  01/30/2024  Length of Stay:  2  Admission Source:  Transfer  Advance Directives:  None-Discussed  Hospice involvement prior to admission?  Not applicable     Subjective:     Seen at bedside. Remains intubated and mechanically ventilated. Follow up CT scan this morning demonstrating evolving R MCA infarct with expanding hemorrhage compared to prior. Worsening mass effect and midline shift. Patient no longer able to follow commands and without purposeful movement.     NSGY team discussed with family about decompressive crani. Family opting to not proceed with surgery.     NCCU team discussed with family about goals of care. Informed the family of patient's poor prognosis and likely to proceed to herniation and brain death if not undergoing surgical procedure. Explained medical management unlikely to resolve current status. After further discussion, family deciding to proceed with making patient DNR/DNI and to transition to comfort focused care with tentative plan for compassionate extubation.     Interval Events:    01/30/24: Admitted to the NCCU. Underwent mechanical thrombectomy.   01/31/24: NAEO. Patient hypotensive requiring norepinephrine  gtt. Starting Tube feeds. Remains intubated for mental status. 24hr scan showing small new hemorrhage in right insula.   02/01/24: Exam deteriorating. Repeat CT brain showing expanding hemorrhagic transformation. Family opting to transition to comfort measures.    Inpatient Medications:   acetaminophen  (TYLENOL ) tablet, 650 mg, Gastric (NG, OG, PEG,  GT), Q6H PRN  aspirin  chewable tablet 81 mg, 81 mg, Gastric (NG, OG, PEG, GT), Daily  clopidogrel  (PLAVIX ) 75 mg tablet, 75 mg, Gastric (NG, OG, PEG, GT), Daily  Correction/SSIP insulin  lispro 100 units/mL injection, 2-9 Units, Subcutaneous, 4x/day AC  D5W 250 mL flush bag, , Intravenous, Q15 Min  PRN  dextrose  (GLUTOSE) 40% oral gel, 15 g, Oral, Q15 Min PRN  dextrose  50% (0.5 g/mL) injection - syringe, 12.5 g, Intravenous, Q15 Min PRN  donepezil  (ARICEPT ) tablet, 5 mg, Gastric (NG, OG, PEG, GT), NIGHTLY  electrolyte-R pH 7.4 (NORMOSOL-R  pH 7.4) premix infusion, , Intravenous, Continuous  glucagon  injection 1 mg, 1 mg, IntraMUSCULAR, Once PRN  hydrALAZINE  (APRESOLINE ) injection 10 mg, 10 mg, Intravenous, Q6H PRN  labetalol  (TRANDATE ) 5 mg/mL injection, 5 mg, Intravenous, Q15 Min PRN  levothyroxine  (SYNTHROID ) tablet, 75 mcg, Gastric (NG, OG, PEG, GT), QAM  magnesium  hydroxide (MILK OF MAGNESIA) 400mg  per 5mL oral liquid, 15 mL, Gastric (NG, OG, PEG, GT), 4x/day  norepinephrine  (LEVOPHED ) 16 mg in NS 250mL premix infusion, 0-0.3 mcg/kg/min, Intravenous, Continuous  NS 250 mL flush bag, , Intravenous, Q15 Min PRN  NS flush syringe, 2-6 mL, Intracatheter, Q8HRS  NS flush syringe, 2-6 mL, Intracatheter, Q1 MIN PRN  polyethylene glycol (MIRALAX ) oral packet, 34 g, Gastric (NG, OG, PEG, GT), Daily  rosuvastatin  (CRESTOR ) tablet, 20 mg, Gastric (NG, OG, PEG, GT), QPM  sennosides (SENOKOT) 8.8mg  per 5mL oral liquid, 10 mL, Gastric (NG, OG, PEG, GT), 2x/day  sodium phosphate  10 mmol in D5W 100 mL IVPB, 10 mmol, Intravenous, Once        Objective:     Most Recent Vital Signs:  Vital Signs Range Most Recent   Temperature Temp  Avg: 37.4 C (99.3 F)  Min: 36.5 C (97.7 F)  Max: 38.1 C (100.6 F) Temperature: 36.5 C (97.7 F)   Heart Rate Pulse  Avg: 63.6  Min: 52  Max: 80 Heart Rate: 62   Blood Pressure BP  Min: 118/79  Max: 162/48 BP (Non-Invasive): (!) 131/52   Respiration Resp  Avg: 19.6  Min: 11  Max: 31 Respiratory Rate: 20   SpO2 SpO2  Avg: 98.3 %  Min: 91 %  Max: 100 % SpO2: 98 %     Physical Exam:      Gen: Ill appearing     HENT:  Normocephalic, atraumatic.  Sclera anicteric.  No oropharyngeal lesions.     CV: Regular rate and rhythm. No murmurs or rubs.      Pulmonary: Intubated on ventilatory support.      Psych: Depressed due to medical condition.     Neurological Exam:      Mental Status Exam:     Level of Consciousness:        GCS - Eye:  1 - No eye opening      GCS - Verbal:  1T - intubated, unable to assess      GCS - Motor:  4 - Withdrawing from painful stimuli; triple-flexion reflex in lower extremities bilaterally. Flexor posturing in LUE and withdrawal to pain in the RUE.    Attention:  Does not track examiner.    Language:  Unable to assess due to intubation   Speech: Unable to assess due to intubation     Cranial Nerves:    CN2:  Pupils equal, briskly reactive to light.,    CN3/4/6:  Gaze midline   CN5:  Symmetric at rest, but otherwise unable to assess due to patient cooperation.  CN7:  Facial activation symmetric.   CN8:  Unable to assess   CN9-12:  Unable to assess due to intubation or patient cooperation.     Musculoskeletal:    Strength:   Right Arm:  Unable to test due to patient cooperation.   Left Arm:  Unable to test due to patient cooperation.   Right Leg:  Unable to test due to patient cooperation.   Left Leg:  Unable to test due to patient cooperation.     Sensation: Responds to noxious stimuli bilaterally.     Coordination:  unable to assess      Pertinent Labs:  Complete Blood Count  Recent Labs     02/01/24  0017   WBC 11.0   HGB 8.4*   PLTCNT 170    Metabolic Panel  Recent Labs     02/01/24  0017   SODIUM 135*   POTASSIUM 3.9   CHLORIDE 107   CO2 24   BUN 22   CREATININE 0.79   CALCIUM  7.5*   MAGNESIUM  2.2   PHOSPHORUS 1.3*      Liver Function Test  Recent Labs     01/30/24  1138   ALT 9   AST 28    Miscellaneous Labs  Recent Labs     01/30/24  1138   LDLCHOL 43   HA1C 5.5   TSH 1.451         Pertinent Imaging Studies:    CT Brain wo (01/06):  Large, evolving right MCA territorial infarct with worsening hemorrhagic transformation with enlargement of the parenchymal hematoma centered in the right basal ganglia and new additional scattered foci of small parenchymal hematomas and petechial  hemorrhage.  Worsening mass effect with increase effacement of the right lateral ventricle, as well as distortion is dilation of the left lateral ventricle temporal horns, concerning for entrapment.  Worsened leftward midline shift measuring approximately 1.1 cm (previously 0.5 cm)    CT Brain wo 1/5:   Wet read: Evolving large R MCA infarct with petechial hemorrhages and new focal hemorrhage in R insular region. Stable midline shift of 4 mm.     MRI Brain w/wo (01/04):   Acute infarct involving the right middle cerebral artery territory (principally the superior division) involving the right basal ganglia, right insula, and right frontal lobe. There is predominantly petechial type hemorrhage within the infarcted territory. There is mild right to left midline shift at the level the foramen of [place]    CT Stroke protocol 1/4:   Diminutive right ICA with severely asymmetrically diminished flow within the right MCA territory. 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 Recommend MRI for further evaluation.  Groundglass opacities within the bilateral lung apices. This may represent developing infectious or inflammatory process.    Neurologic:       Ischemic Stroke Labs:  Lab Results   Component Value Date    LDLCHOL 43 01/30/2024        Acute Ischemic Stroke complicated by worsening hemorrhagic conversion:  Location R MCA.  NIHSS 20.  TPA not given due to recent surgery..  Mechanical Thrombectomy performed with TICI  3 flow.  Neurological exam notable for the following:  1/1/1T, though not reversed from anesthesia. Exam improved to 1/6/1T following reversal. Unfortunately, overnight of 1/5 to 1/6,  Neurological exam changed and notable for the following 1/4/1T.   Monitoring   Continue q1h Neurochecks & Vitals, STAT Head CT for changes in exam  Strict NPO until passes a dysphagia  screen  Diagnostics:  Labs:  A1c 5.5%, Lipid Panel with HDL low, total cholesterol 97, TSH 1.451  Carotid Imaging:  CT-angiogram  demonstrated R ICA Stenosis.  Repeat CT scan at 24h to monitor for hemorrhagic conversion - demonstrating new focal hemorrhage of R insula  Repeat CT scan on 1/6 showing expanding R MCA infarct and expanding hemorrhage with mass effect and midline shift.  Surface Echocardiogram:  LVEF 69%, systolic function intact. Diastolic function indeterminate.    MRI brain:  R MCA infarct with petechial hemorrhages and mild midline shift.   Therapeutics:  Family opting to proceed with comfort focused care with plans of compassionate extubation.   Discontinuing DAPT given above       Cardiovascular:     Status:   Hypotensive overnight without arrhythmias.  Vitals:  Heart Rate Pulse  Avg: 63.6  Min: 52  Max: 80 Heart Rate: 62   Blood Pressure BP  Min: 118/79  Max: 162/48 BP (Non-Invasive): (!) 131/52   Cardiac Labs:  No results found for: TROPONIN, TROPONINI, TROPONIINI, BNP     Shock, undifferentiated   Neuroscience blood pressure goals:      Loosen BP parameters given transition to CMO.   Norepinephrine  weaned overnight  Continue current antihypertensive regimen:  hold home medications unless hypertensive.  Admission Troponin/EKG:  193.2  Surface Echocardiogram:  LVEF 69%, systolic function intact. Diastolic function indeterminate       Pulmonary:     Status:  Acute hypoxic respiratory failure.  Oxygen Requirement:  Mode: SPONT  Set PEEP: 5 cmH2O  Pressure Support: 10 cmH2O  FiO2: 35 %  I:E Ratio: 1.2:1    Most Recent Arterial Blood Gas:  Lab Results   Component Value Date    PH 7.44 (H) 02/01/2024    PCO2 45 01/30/2024    PO2 81 (L) 01/30/2024        Intubated for Mechanical Thrombectomy. Remaining intubated post-procedure due to mental status.   Continue Incentive Spirometry  Maintain SpO2 >90%  Plan for compassionate extubation later today. Ordering PRNs for patient comfort to be given at time of extubation    Gastrointestinal     Most recent LFTs:  Lab Results   Component Value Date    AST 28 01/30/2024    ALT 9  01/30/2024    ALKPHOS 86 01/28/2024    INR 1.06 01/28/2024      Diet:  Discontinue tube feeds, remove OG tube  GI PPx:  not indicated (patient not intubated, on steroids, or septic) - as patient now CMO.   Bowel Regimen:  d/c     Hepatic Steatosis   Evidenced on CT CAP w/ contrast (01/28/24)  No acute concern      Renal:   Status:  No evidence of acute kidney injury.  Continue to monitor I/Os.  Most recent Renal Labs:  Lab Results   Component Value Date    SODIUM 135 (L) 02/01/2024    POTASSIUM 3.9 02/01/2024    BUN 22 02/01/2024    CREATININE 0.79 02/01/2024    CALCIUM  7.5 (L) 02/01/2024    MAGNESIUM  2.2 02/01/2024    PHOSPHORUS 1.3 (L) 02/01/2024        No acute concern     Hematology:     Most recent Hematology Labs:  Lab Results   Component Value Date    HGB 8.4 (L) 02/01/2024    MCV 85.2 02/01/2024    MCH 27.6 02/01/2024    PLTCNT 170 02/01/2024  Acute Normocytic Anemia, suspected etiology from acute blood loss   Hemoglobin dropping from 11.5 to 9.2 after admission and post-procedure. Favoring acute blood loss anemia from procedure  Hemoglobin:  Hemoglobin stable currently  Platelet Count:  Platelets adequate.  DVT Prophylaxis:  SCDs, anticoagulation held due to thrombectomy. And now CMO  Discontinuing DAPT as CMO    Free Fluid within Endometrial Cavity  Seen on CT CAP w/ contrast (01/28/24)  No acute concern      Endocrine:       Most recent Endocrine Labs:  Lab Results   Component Value Date    GLUIP 166 (H) 02/01/2024    GLUIP 196 (H) 01/31/2024    GLUIP 214 (H) 01/31/2024    GLUIP 197 (H) 01/31/2024      Neuroscience Glycemic Goals < 180  No acute concern    Hx of Hypothyroidism  Thyroid  Nodules   Scattered Thyroid  Nodules seen on CT CAP w/ contrast (01/28/24)   Plan for outpatient workup with Thyroid  Ultrasound following discharge  D/c synthroid     Infectious Disease:     Status:  Afebrile, downtrending leukocytosis  Temperature Profile:  Temperature Temp  Avg: 37.4 C (99.3 F)  Min: 36.5 C (97.7 F)  Max:  38.1 C (100.6 F) Temperature: 36.5 C (97.7 F)     WBC Count  Lab Results   Component Value Date    WBC 11.0 02/01/2024    WBC 11.4 (H) 01/31/2024    WBC 8.7 01/30/2024    PMNS 83.2 01/30/2024    LYMPHO 6.7 01/30/2024      Monitor for signs and symptoms of infection     Other Systems:     Recent Left Femoral Neck Fracture s/p Left Hip Hemiarthroplasty 01/29/2024  Procedure performed at outside hospital.   Consult ortho for postop management recs, appreciate recs     Lines/Drains/Access:  Patient Lines/Drains/Airways Status       Active Line / Dialysis Catheter / Dialysis Graft / Drain / Airway / Wound       Name Placement date Placement time Site Days    Peripheral IV Left;Posterior Dorsal Metacarpals  (top of hand) 01/30/24  1100  -- 1    Peripheral IV Ultrasound guided Left;Proximal Basilic  (medial side of arm) 01/31/24  0900  -- less than 1    Arterial Line Left Femoral 01/30/24  1014  Femoral  1    Oral Gastric Tube --  --  -- --    Foley Catheter 01/30/24  1900  -- 1    EndoTracheal Tube Cuffed;Oral 7.0 22 Lip 01/30/24  1005  -- 1    Wound  Incision Left;Outer Thigh 01/29/24  1032  -- 2    Wound  Incision Anterior;Proximal;Right Groin 01/30/24  1900  -- 1                     Emergency Contact:  Extended Emergency Contact Information  Primary Emergency Contact: Happ,EDWARD  Address: [address]           [place], [place] [address] [place]  Home Phone: [phone]  Work Phone: [phone]  Mobile Phone: [phone]  Relation: Husband  Preferred language: English  Interpreter needed? No  Secondary Emergency Contact: [facility]  Address: [address]           [place], [place] [address] [place]  Home Phone: [phone]  Work Phone: [phone]  Mobile Phone: [phone]  Relation: Daughter  Preferred  language: English  Interpreter needed? No     Restraints:  None  Skin:  intact.  Activity:  Strict bed rest   Disposition:  Remain in NSICU due to ventilatory support.        Code Status:  Comfort measures. DNR DNI     Edsel Bruce, MD  02/01/2024, 06:40         Neurocritical Care attending note findings and recommendations:    The patient is a 89 y.o. year old female with past medical history of recent left hip replacement, HTN, HLD, hypothyroidism, right carotid stenosis who presents to the NCCU for acute ischemic stroke.     Attending Exam findings: No eye opening. Does not regard, rightward gaze present. RUE no movement. RLE triple flex. LUE flexion, LLE triple flexion.     Systems Based Interventions:    1. Neurologic  Acute Ischemic Stroke, Right MCA occlusion  - clinically with left hemiparesis  - CTSP with severe right ICA stenosis, right MCA occlusion  - s/p thrombectomy 1/4 with right ICA stent & balloon angioplasty  - etiology of stroke - large vessel disease   - CT brain 1/6 personally reviewed with evolving right MCA infarct, increased cerebral edema and hemorrhagic transformation  - there is imaging evidence of cerebral edema and brain compression  - ASA/Plavix  - held due to hemorrhagic transformation   - Crestor  20 mg daily   - TTE pending  - MRI brain completed 1/4 confirming right MCA territory infarct with petechial hemorrhagic transformation  - neurosurgery consulted, likely need for decompressive hemicraniectomy today   - PT/OT/SS    2. Cardiac  Shock  - norepi now off  - TTE with preserved EF and no cardiac source of embolism    3. Respiratory  Respiratory Insufficiency  - intubated for airway protection in the setting of altered mental status    Ventilator Settings:  Mode: SPONT  Set PEEP: 5 cmH2O  Pressure Support: 10 cmH2O  FiO2: 35 %  I:E Ratio: 1.2:1    4. Gastrointestinal  - tube feeds: hold for possible OR   - Pepcid  20 mg BID for GI ppx     5. Renal  - Normosol at 75 mL/h   - Foley catheter in place since 1/4, required for accurate I&Os    6. Hematological  - DVT chemoprophylaxis held x48h due to ICH (resume 1/8)    7. Endocrine  - maintain sliding scale  insulin  to assess insulin  needs    Hypothyroidism  - Synthroid  75 mcg    8. Infectious Disease  - monitor for signs of infection    9. Goals of Care  Discussed patient extensively with family. Husband Sylver Vantassell (clinical research associate) was present. Family previously declined decompression when discussed with neurosurgery earlier this morning. I conveyed that I expect the patient likely will progress to brain death without surgical intervention. Given this expectation, family elected to pursue comfort measures only.     Otherwise Plan as Above in Resident or Midlevel's Note.     Critical Care Attestation   I have reviewed the resident/ midlevel's note.  I agree with their findings and/or have made additions/ edits as well as my findings above.  I was present at the bedside of this critically ill patient exclusive of procedures that are documented elsewhere.  My services were independent and non-duplicative of other practioners of other specialties (non-Neurocritical Care).  This patient suffers from failure or dysfunction of Neurological system(s).   The care  of this patient was in regard to managing (a) conditions(s) that has a high probability of sudden, clinically significant, or life-threatening deterioration and required a high degree of Attending Physician attention and direct involvement to intervene urgently. Data review and care planning was performed in direct proximity of the patient, examination was obviously performed in direct contact with the patient. All of this time was exclusive of procedure which will be documented elsewhere in the chart.   My critical care time involved full attention to the patient's condition and included:   Review of nursing notes and/or old charts   Review of medications, allergies, and vital signs   Documentation time   Consultant collaboration on findings and treatment options   Care, transfer of care, and discharge plans   Ordering, interpreting, and reviewing  diagnostic studies/ lab tests   Obtaining necessary history from family, EMS, nursing home staff and/or treating physicians   My critical care time did not include time spent teaching resident physician(s) or other services of resident physicians, or performing other reported procedures.     Total Critical Care Time: 55 minutes    Lorn Fuller, MD  02/01/2024

## 2024-02-02 DIAGNOSIS — I63511 Cerebral infarction due to unspecified occlusion or stenosis of right middle cerebral artery: Secondary | ICD-10-CM

## 2024-02-02 DIAGNOSIS — R627 Adult failure to thrive: Secondary | ICD-10-CM

## 2024-02-02 DIAGNOSIS — Z515 Encounter for palliative care: Secondary | ICD-10-CM

## 2024-02-02 DIAGNOSIS — Z8673 Personal history of transient ischemic attack (TIA), and cerebral infarction without residual deficits: Secondary | ICD-10-CM

## 2024-02-02 MED ORDER — ACETAMINOPHEN 650 MG RECTAL SUPPOSITORY
650.0000 mg | RECTAL | Status: DC | PRN
  Administered 2024-02-03 – 2024-02-04 (×2): 650 mg via RECTAL
  Filled 2024-02-02 (×2): qty 1

## 2024-02-02 MED ORDER — SCOPOLAMINE 1 MG OVER 3 DAYS TRANSDERMAL PATCH
1.0000 | MEDICATED_PATCH | TRANSDERMAL | Status: DC
  Administered 2024-02-02: 1 via TRANSDERMAL
  Filled 2024-02-02: qty 1

## 2024-02-02 MED ORDER — MIDAZOLAM 1 MG/ML INJECTION WRAPPER
2.0000 mg | INTRAMUSCULAR | Status: DC | PRN
  Administered 2024-02-02: 2 mg via INTRAVENOUS
  Filled 2024-02-02: qty 2

## 2024-02-02 NOTE — Care Plan (Signed)
 Medical Nutrition Therapy  I: Per MD notes, pt is now Comfort Measures Only.  P: Will sign off at this time.  Please consult if further nutrition intervention is warranted.      Deborrah Landy Hunter, RD      Goal Outcome Evaluation:     Anxieties, Fears or Concerns: (not recorded)  Individualized Care Needs: (not recorded)  Patient-Specific Goals (Include Timeframe): (not recorded)  Plan of Care Reviewed With: (not recorded)     Patient Progress: declining

## 2024-02-02 NOTE — Nurses Notes (Signed)
 RN at bedside. RR 28. PRN given. Will continue to monitor.

## 2024-02-02 NOTE — Expiration Summary (Incomplete)
 EXPIRATION SUMMARY        PATIENT NAME:  Bridget [name], Bridget [name]   MRN:  [id number]  DOB:  07-27-1934    ADMISSION DATE:  01/30/2024  EXPIRATION DATE:      PRIMARY CARE PHYSICIAN: Deward Squibb, MD     ADMISSION DIAGNOSIS:  Acute ischemic right MCA stroke (CMS [facility] [I63.511]    FINAL DIAGNOSIS:  Large right acute ischemic stroke    Principle Problem:  Acute ischemic right MCA stroke (CMS [facility])    Active Hospital Problems    Diagnosis Date Noted    Principal Problem: Acute ischemic right MCA stroke (CMS [facility]) [I63.511] 01/30/2024    Left hemiparesis (CMS [facility]) [G81.94] 01/31/2024    S/P total left hip arthroplasty [Z96.642] 01/31/2024    Gait abnormality [R26.9] 01/31/2024      Resolved Hospital Problems   No resolved problems to display.     Active Non-Hospital Problems    Diagnosis Date Noted    Closed left hip fracture 01/28/2024    Right cavernous carotid stenosis 10/23/2022    Hypertension, unspecified type 10/23/2022    Hyperlipidemia, unspecified hyperlipidemia type 10/23/2022    Acute ischemic stroke (CMS HCC) 10/23/2022    Cognitive impairment 10/23/2022    Cerebrovascular accident (CVA), unspecified mechanism (CMS HCC) 10/22/2022        Code status prior to expiration: CMO/DNR    REASON FOR HOSPITALIZATION AND HOSPITAL COURSE:  [name] is a 89 y.o. female who was currently admitted to [facility], s/p left hip replacement.  She has a past medical history of dementia, hypertension, hyperlipidemia, hypothyroidism, and severe right carotid stenosis/occlusion for which she follows with vascular surgery.  She also has had a right cerebellar infarct in 09/2022. This morning she had an acute neurological change for which code stroke was activated. Patient developed acute onset left sided weakness and encephalopathy and telestroke was performed with Dr. Hildegard. CT stroke protocol performed at [facility] showed severe stenosis of the right ICA origin, multifocal  thrombus with severe narrowing in the right cavernous and supraclinoid ICA, multifocal thrombus in the right MCA M1 segment with near complete occlusion, and occlusion at the right ACA origin with prompt reconstitution of the right A1 segment. There is mismatch perfusion defect in the right MCA territory. She was taking Plavix  and rosuvastatin  20 mg PTA. At baseline she is ambulatory and has some mild cognitive changes, including memory loss. Patient was transferred to [facility] for mechanical thrombectomy and was taken directly to IR by neurology stroke team and [facility].  Patient underwent mechanical thrombectomy of right ICA/T occlusion with acute right ICA stenting with TICI3 recan.  Despite intervention, patient had large right MCA stroke with eventual hemorrhage in the stroke bed.  Patient and family offered decompressive crani however they declined and transitioned to comfort measures only.  Symptoms managed with PRN versed  and Morphine .  Patient passed away on *** at ***.    cc: Primary Care Physician:  Deward Squibb, MD  [address]  [place] [address]    rr:[id number] Physician:  Glenda Meter, MD  [address]  [address]  [place],  [place] [address]  [name], APRN, CNP

## 2024-02-02 NOTE — Consults (Signed)
 [facility] Care Medicine Consult  Follow Up Note    Bridget [name], Bridget [name], 89 y.o. female  Date of Service: 02/02/2024  Date of Birth:  [birth year]    Hospital Day:  LOS: 3 days     Assessment/Recommendations:  encounter for palliative care for adult failure to thrive secondary to right MCA stroke s/p thrombectomy with hemorrhagic conversion, mass effect, and midline shift     -continue comfort focused treatments   -continue with morphine  2 mg every 2 hours as needed.  If symptoms increasing, can decrease PRN frequency to every 30 minutes.  If no improvement with two doses, increase dose by 50-100%  -continue midazolam  2 mg every 1 hour as needed  -continue glycopyrrolate  200 mcg every 4 hours as needed for secretions   -continue acetaminophen  650 mg every 6 hours as needed for fever    Subjective:  patient seen and examined at bedside this morning with three family members (including husband and daughter) also visiting.  They feel that symptoms are well controlled at this time, and I also agree that she looks comfortable.  She has only need three doses of IV morphine  so far with last dose just after midnight.  Our team spoke with house supervisor to coordinate transfer to 7W.  Does not meet criteria for inpatient hospice at this time, but would be an option in the coming days if symptoms escalate.      Objective:  Temperature: 37.5 C (99.5 F)  Heart Rate: 68  BP (Non-Invasive): (!) 150/47  Respiratory Rate: (!) 27  SpO2: 92 %    General: resting in bed, in no distress, vital signs reviewed   Head: Normocephalic, atraumatic   Eyes: eyes closed, no discharge  ENT:  Mucus membranes moist, no visible oral lesions  Respiratory:  breathing comfortably on room air with no accessory muscle use    Neurology:  unresponsive to voice     Labs:  I have reviewed all lab results.    Ventilator Settings: N/A  HFNC Settings: N/A  BIPAP Settings: N/A    Imaging Studies:  Images and Reports reviewed to  current date.    Consultant:  Corean Lais, MD    Medical Decision Making:     Chronic problem with severe exacerbation, progression, or treatment side effects OR Problem posing a threat to life or bodily function:  R MCA stroke with hemorrhagic conversion with mass effect and midline shift      Risk of morbidity:  managing parenteral controlled substances    Corean Lais, MD 02/02/2024, 11:37  Assistant Professor, Geriatrics and Palliative Medicine

## 2024-02-02 NOTE — Progress Notes (Signed)
 [facility]  Neurology Progress Note      Bridget, [name], 89 y.o. female  Date of Admission:  01/30/2024  Date of service: 02/02/2024  Date of Birth:  Mar 11, 1934      Chief Complaint: large right MCA stroke  Pt's condition today: stable    Subjective: Resting in bed, no distress noted.  Family at bedside and updated.    Vital Signs:  Temp (24hrs) Max:37.5 C (99.5 F)      Systolic (24hrs), Avg:140 , Min:126 , Max:161     Diastolic (24hrs), Avg:46, Min:42, Max:53    Temp  Avg: 36.9 C (98.4 F)  Min: 36.6 C (97.9 F)  Max: 37.5 C (99.5 F)  MAP (Non-Invasive)  Avg: 73 mmHG  Min: 66 mmHG  Max: 81 mmHG  Pulse  Avg: 67.3  Min: 61  Max: 80  Resp  Avg: 26  Min: 21  Max: 32  SpO2  Avg: 91.1 %  Min: 82 %  Max: 100 %       Today's Physical Exam:  General:Patient resting in bed with eyes closed, RR low 20's, no apparent distress    Current Medications:  acetaminophen  (TYLENOL ) tablet, 650 mg, Gastric (NG, OG, PEG, GT), Q6H PRN  glycopyrrolate  (ROBINUL ) 0.2 mg/mL (200 mcg/mL) injection, 200 mcg, Intravenous, Q4H PRN  midazolam  (VERSED ) 1 mg/mL injection, 2 mg, Intravenous, Q4H PRN  morphine  2 mg/mL injection, 2 mg, Intravenous, Q2H PRN        I/O:  I/O last 24 hours:    Intake/Output Summary (Last 24 hours) at 02/02/2024 0840  Last data filed at 02/02/2024 0600  Gross per 24 hour   Intake --   Output 300 ml   Net -300 ml     I/O current shift:  No intake/output data recorded.    Labs  Please indicate ordered or reviewed)  Reviewed: I have reviewed all lab results.    Review of reports and notes reveal:     Independent Interpretation of images or specimens:  None to review    Patient/ Family Discussion: Updated    Assessment/Plan:  Active Hospital Problems    Diagnosis    Primary Problem: Acute ischemic right MCA stroke (CMS HCC)    Left hemiparesis (CMS HCC)    S/P total left hip arthroplasty    Gait abnormality     Bridget [name] is a 89 y.o. female who was currently admitted to [facility], s/p left hip  replacement.  She has a past medical history of dementia, hypertension, hyperlipidemia, hypothyroidism, and severe right carotid stenosis/occlusion for which she follows with vascular surgery.  She also has had a right cerebellar infarct in 09/2022. This morning she had an acute neurological change for which code stroke was activated. Patient developed acute onset left sided weakness and encephalopathy and telestroke was performed with Dr. Hildegard. CT stroke protocol performed at [facility] showed severe stenosis of the right ICA origin, multifocal thrombus with severe narrowing in the right cavernous and supraclinoid ICA, multifocal thrombus in the right MCA M1 segment with near complete occlusion, and occlusion at the right ACA origin with prompt reconstitution of the right A1 segment. There is mismatch perfusion defect in the right MCA territory. She was taking Plavix  and rosuvastatin  20 mg PTA. At baseline she is ambulatory and has some mild cognitive changes, including memory loss. Patient was transferred to [facility] for mechanical thrombectomy and was taken directly to IR by neurology stroke team and [facility].  Patient  underwent mechanical thrombectomy of right ICA/T occlusion with acute right ICA stenting with TICI3 recan.  Despite intervention, patient had large right MCA stroke with eventual hemorrhage in the stroke bed.  Patient and family offered decompressive crani however they declined and transitioned to comfort measures only.    Large right MCA stroke s/p MT and stenting to right ICA  - Large stroke with decline in neuro exam  - Patient transitioned to CMO and extubated after discussion with family  - Continue mouth care and turns  - PRN Morphine , Versed , Robinul , and tylenol   - Foley  - Supportive care following, appreciate assistance  - Music therapy    Labs/Diagnostic studies ordered: None    DVT/PE Prophylaxis: NA    Bridget LITTIE Cosier, APRN, CNP      I saw and examined the patient.  I reviewed the  resident's note.  I agree with the findings and plan of care as documented in the resident's note.  Any exceptions/additions are edited/noted.    Padraig Nhan, MD

## 2024-02-03 DIAGNOSIS — Z515 Encounter for palliative care: Secondary | ICD-10-CM

## 2024-02-03 MED ORDER — MORPHINE 4 MG/ML INTRAVENOUS SOLUTION
6.0000 mg | INTRAVENOUS | Status: DC
  Administered 2024-02-03 – 2024-02-04 (×7): 6 mg via INTRAVENOUS
  Filled 2024-02-03 (×7): qty 2

## 2024-02-03 MED ORDER — MORPHINE 2 MG/ML INTRAVENOUS SYRINGE
2.0000 mg | INJECTION | INTRAVENOUS | Status: DC
  Administered 2024-02-03: 2 mg via INTRAVENOUS
  Filled 2024-02-03: qty 1

## 2024-02-03 MED ORDER — MORPHINE 4 MG/ML INTRAVENOUS SOLUTION: 4.0000 mg | INTRAVENOUS | Status: DC

## 2024-02-03 MED ORDER — GLYCOPYRROLATE 0.2 MG/ML INJECTION SOLUTION
200.0000 ug | INTRAMUSCULAR | Status: DC
  Administered 2024-02-03 – 2024-02-04 (×9): 200 ug via INTRAVENOUS
  Filled 2024-02-03 (×9): qty 1

## 2024-02-03 MED ORDER — MORPHINE 2 MG/ML INTRAVENOUS SYRINGE
2.0000 mg | INJECTION | INTRAVENOUS | Status: DC | PRN
  Administered 2024-02-03 – 2024-02-04 (×3): 2 mg via INTRAVENOUS
  Filled 2024-02-03 (×3): qty 1

## 2024-02-03 NOTE — Progress Notes (Signed)
 [facility]  Neurology Progress Note      Bridget [name], Bridget [name], 89 y.o. female  Date of Admission:  01/30/2024  Date of service: 02/03/2024  Date of Birth:  1934/11/14      Chief Complaint: large right MCA stroke  Pt's condition today: stable    Subjective:  Patient resting in bed, RR in mid 20's.  Family at bedside noticing increased RR and secretions since 4am, otherwise no signs of distress    Vital Signs:  Temp (24hrs) Max:37.3 C (99.1 F)      Systolic (24hrs), Avg:189 , Min:188 , Max:189     Diastolic (24hrs), Avg:65, Min:53, Max:76    Temp  Avg: 37.2 C (99 F)  Min: 37.1 C (98.8 F)  Max: 37.3 C (99.1 F)  MAP (Non-Invasive)  Avg: 101.5 mmHG  Min: 94 mmHG  Max: 109 mmHG  Pulse  Avg: 66.5  Min: 64  Max: 69  Resp  Avg: 24  Min: 22  Max: 28  SpO2  Avg: 88 %  Min: 86 %  Max: 90 %       Today's Physical Exam:  General: Patient resting in bed with eyes closed, RR mid 20's, no apparent distress, warm to touch    Current Medications:  acetaminophen  (TYLENOL ) rectal suppository, 650 mg, Rectal, Q4H PRN  glycopyrrolate  (ROBINUL ) 0.2 mg/mL (200 mcg/mL) injection, 200 mcg, Intravenous, Q4H PRN  glycopyrrolate  (ROBINUL ) 0.2 mg/mL (200 mcg/mL) injection, 200 mcg, Intravenous, Q4HRS  midazolam  (VERSED ) 1 mg/mL injection, 2 mg, Intravenous, Q1H PRN  morphine  2 mg/mL injection, 2 mg, Intravenous, Q2H PRN  morphine  2 mg/mL injection, 2 mg, Intravenous, Q4HRS  scopolamine  1 mg over 3 days transdermal patch, 1 Patch, Transdermal, Q72H        I/O:  I/O last 24 hours:    Intake/Output Summary (Last 24 hours) at 02/03/2024 0713  Last data filed at 02/03/2024 0500  Gross per 24 hour   Intake --   Output 575 ml   Net -575 ml     I/O current shift:  No intake/output data recorded.    Labs  Please indicate ordered or reviewed)  Reviewed: I have reviewed all lab results.    Review of reports and notes reveal:     Independent Interpretation of images or specimens:  None to review    Patient/ Family Discussion:  Updated    Assessment/Plan:  Active Hospital Problems    Diagnosis    Primary Problem: Acute ischemic right MCA stroke (CMS HCC)    Left hemiparesis (CMS HCC)    S/P total left hip arthroplasty    Gait abnormality     [name] is a 89 y.o. female who was currently admitted to [facility], s/p left hip replacement.  She has a past medical history of dementia, hypertension, hyperlipidemia, hypothyroidism, and severe right carotid stenosis/occlusion for which she follows with vascular surgery.  She also has had a right cerebellar infarct in 09/2022. This morning she had an acute neurological change for which code stroke was activated. Patient developed acute onset left sided weakness and encephalopathy and telestroke was performed with Dr. Hildegard. CT stroke protocol performed at [facility] showed severe stenosis of the right ICA origin, multifocal thrombus with severe narrowing in the right cavernous and supraclinoid ICA, multifocal thrombus in the right MCA M1 segment with near complete occlusion, and occlusion at the right ACA origin with prompt reconstitution of the right A1 segment. There is mismatch perfusion defect in the right  MCA territory. She was taking Plavix  and rosuvastatin  20 mg PTA. At baseline she is ambulatory and has some mild cognitive changes, including memory loss. Patient was transferred to [facility] for mechanical thrombectomy and was taken directly to IR by neurology stroke team and [facility].  Patient underwent mechanical thrombectomy of right ICA/T occlusion with acute right ICA stenting with TICI3 recan.  Despite intervention, patient had large right MCA stroke with eventual hemorrhage in the stroke bed.  Patient and family offered decompressive crani however they declined and transitioned to comfort measures only.    Large right MCA stroke s/p MT and stenting to right ICA  - Large stroke with decline in neuro exam  - Patient transitioned to CMO and extubated after discussion with  family  - Continue mouth care and turns  - PRN Morphine , Versed , Robinul , and tylenol   - Will schedule morphine  and robinul  given increase in RR and secretions overnight  - Foley  - Supportive care following, appreciate assistance  - Music therapy    Labs/Diagnostic studies ordered: None    DVT/PE Prophylaxis: NA      Camie Cosier NP-C  02/03/2024 07:13  APRN Neurology/Stroke  #[id number]      I saw and examined the patient.  I reviewed the resident's note.  I agree with the findings and plan of care as documented in the resident's note.  Any exceptions/additions are edited/noted.    Bridget Mignano, MD

## 2024-02-03 NOTE — Nurses Notes (Addendum)
 72- RN to bedside. RR 28. Temp 100.8 Axillary. Scheduled and PRN meds given. Will continue to monitor.     9047- RN to bedside. RR 30. PRN meds given. Will continue to monitor pt.    1138- RN to bedside. RR 28. PRN given. Will continue to monitor.     1254- RN to bedside to administer scheduled meds. Will continue to monitor pt    1413- RN to bedside. RR 24. PRN given. Will continue to monitor.    1600- RN to bedside PT resting at this time. RR 20. No s/s of pain or discomfort. Will continue to monitor.     1800- RN to bedside PT resting at this time. No s/s of pain or discomfort. Will continue to monitor.

## 2024-02-03 NOTE — Nurses Notes (Signed)
 02/03/24 0356   Pain   Pain Assessment Assessment   Rationale for Non-Scoring Patient resting - eyes closed and respirations WDL   Pain Scale Used AD   PAINAD Breathing 0-->normal   PAINAD Negative Vocalization 0-->none   PAINAD Facial Expression 0-->smiling or inexpressive   PAINAD Body Language 0-->relaxed   PAINAD Consolability 0-->no need to console   PAINAD Score 0

## 2024-02-03 NOTE — Consults (Addendum)
 [facility]  Supportive Care Medicine Consult  Follow Up Note    Bridget [name], Bridget [name], 89 y.o. female  Date of Service: 02/03/2024  Date of Birth:  [birth year]    Hospital Day:  LOS: 4 days     Assessment/Recommendations:  encounter for palliative care for adult failure to thrive secondary to right MCA stroke s/p thrombectomy with hemorrhagic conversion, mass effect, and midline shift     Increase scheduled morphine  to 6 mg IV q.4 hours (ordered)  Shorten prn morphine  interval to 2 mg IV Q 20 minutes PRN (ordered).  If no improvement in symptoms, can double to 4 mg  Continue midazolam  2 mg q.1h PRN  Continue glycopyrrolate  200 mcg q.4 hours PRN for secretions   Continue acetaminophen  rectal suppository 650 mg q.6 hours PRN for fever  Does not meet GIP criteria at this time, we will continue to follow    Subjective:  Patient seen and examined at bedside.  Her husband and 2 of her children were present.  Primary team started scheduled morphine  this morning due to frequent PRN usage.  Has required 5 doses of p.r.n. morphine  in the last 24 hours.      Objective:  Temperature: 37.8 C (100 F)  Heart Rate: 69  BP (Non-Invasive): (!) 189/53  Respiratory Rate: (!) 28  SpO2: 90 %    Constitutional: Acutely and chronically ill-appearing.  Respiratory:  Mildly labored breathing with some accessory muscle use  Cardiovascular:  Radial pulses 2+  unable to palpate DP pulses  Skin:  Warm, slightly diaphoretic  Neuro/Psych:  Obtunded.  Not agitated.    Joretta Pear, MD 02/03/2024, 12:23  Assistant Professor  Hospice and Palliative Medicine  [place]  [facility]

## 2024-02-03 NOTE — Care Plan (Signed)
 Shift Summary  Multiple doses of morphine  were given throughout the shift to address tachypnea and air hunger, with pain remaining low and comfort maintained.   Glycopyrrolate  was administered several times for excessive secretions.   Acetaminophen  was given PRN for comfort.   Skin integrity was supported by frequent repositioning, dry linens, and use of pressure reduction devices.   Overall, comfort and skin health were maintained during the shift.     Skin Health and Integrity: L hip incision and bilateral groin sites were covered with gauze and remained dry throughout the shift; skin was noted to be bruised and maroon/purple in color, with intact sacral dressing and limited adhesive use. Pressure reduction techniques and devices were consistently utilized, and frequent repositioning with two-person assist was maintained to support skin integrity.     Optimal Comfort and Wellbeing: Comfort measures were prioritized, including lightweight bedding and clothing, and bedrest was maintained; pain was assessed using PAINAD, with a low score and relaxed body language, and morphine  and acetaminophen  were administered PRN for tachypnea and air hunger, resulting in no significant distress. Emotional state was calm and accepting, and sleep/rest was not problematic.     Goal Outcome Evaluation:     Anxieties, Fears or Concerns: uta (02/03/24 0829)  Individualized Care Needs: cmo (02/03/24 9170)  Patient-Specific Goals (Include Timeframe): comfort (02/03/24 9170)  Plan of Care Reviewed With: family (02/03/24 0829)     Plan of care reviewed with family.  Pain managed with meds per MAR. Assessment per doc flow. Pt free from falls this shift. Needs met with hourly rounding. Pt resting at this time with call bell in reach. Wheels locked and bed in lowest position. Will continue to monitor.

## 2024-02-03 NOTE — Nurses Notes (Signed)
 02/03/24 0600   Pain   Pain Assessment Assessment   Rationale for Non-Scoring Patient resting - eyes closed and respirations WDL   Pain Scale Used AD   PAINAD Breathing 0-->normal   PAINAD Negative Vocalization 0-->none   PAINAD Facial Expression 0-->smiling or inexpressive   PAINAD Body Language 0-->relaxed   PAINAD Consolability 0-->no need to console   PAINAD Score 0

## 2024-02-03 NOTE — Care Plan (Signed)
 Optimize Skin Protection  Recent Flowsheet Documentation  Taken yesterday at 1930 by Nelwyn Lama, RN  Pressure Reduction Techniques  30 degree lateral position utilized;Patient turned q 2 hours  Pressure Reduction Devices  Repositioning wedges/pillows utilized;Specialty bed/mattress utilized  Stroke, Ischemic (Includes Transient Ischemic Attack)  Optimal Functional Ability  Yesterday at 2010 - Shift Focus by Nelwyn Lama, RN  Optimize Functional Ability  Recent Flowsheet Documentation  Taken yesterday at 1930 by Nelwyn Lama, RN  Self-Care Promotion  BADL personal objects within reach;BADL personal routines maintained

## 2024-02-03 NOTE — Ancillary Notes (Signed)
 [facility]  Spiritual Care Note    Patient Name:  Bridget [name] [facility]  Date of Encounter:   02/03/2024       02/02/24 1040   Clinical Encounter Type   Reason for Visit General Spiritual Care Visit   Referral From Chaplain-initiated   Declined Spiritual Care Visit At This Time No   Visited With Patient   Visted by Volunteer Chaplain: Yes   Name Fr Arley   Date of Visit 02/02/24   Patient Spiritual Encounters   Ritual Prayer   Time of Encounters   Start Time 1020   Stop Time 1040   Duration (minutes) 20 Minutes       Wylan Gentzler, BCC  Pager: [phone]  Total Time of Encounter: 20 min.

## 2024-02-04 LAB — ADULT ROUTINE BLOOD CULTURE, SET OF 2 BOTTLES (BACTERIA AND YEAST): BLOOD CULTURE, ROUTINE: NO GROWTH

## 2024-02-04 MED ORDER — ACETAMINOPHEN 1,000 MG/100 ML (10 MG/ML) INTRAVENOUS SOLUTION
1000.0000 mg | Freq: Three times a day (TID) | INTRAVENOUS | Status: DC | PRN
  Administered 2024-02-04: 0 mg via INTRAVENOUS
  Administered 2024-02-04: 1000 mg via INTRAVENOUS
  Filled 2024-02-04: qty 100

## 2024-02-04 MED ORDER — FENTANYL (PF) 50 MCG/ML INJECTION WRAPPER
100.0000 ug | INJECTION | INTRAMUSCULAR | Status: DC | PRN
  Administered 2024-02-04 (×2): 100 ug via INTRAVENOUS
  Filled 2024-02-04 (×2): qty 2

## 2024-02-04 MED ORDER — MORPHINE 4 MG/ML INTRAVENOUS SOLUTION
6.0000 mg | INTRAVENOUS | Status: DC | PRN
  Administered 2024-02-04 (×3): 6 mg via INTRAVENOUS
  Filled 2024-02-04 (×3): qty 2

## 2024-02-04 MED ORDER — PCA - FENTANYL (PF) 50 MCG/ML
INJECTION | INTRAVENOUS | Status: DC
  Filled 2024-02-04: qty 50

## 2024-02-04 MED ORDER — GLYCOPYRROLATE 0.2 MG/ML INJECTION SOLUTION
400.0000 ug | INTRAMUSCULAR | Status: DC
  Administered 2024-02-04: 400 ug via INTRAVENOUS
  Filled 2024-02-04: qty 2

## 2024-02-04 MED ORDER — MORPHINE 10 MG/ML INTRAVENOUS SOLUTION: 10.0000 mg | INTRAVENOUS | Status: DC

## 2024-02-07 NOTE — Ancillary Notes (Signed)
 Restraint death reviewed and logged

## 2024-02-27 NOTE — Progress Notes (Signed)
 [facility]  Neurology Progress Note      Donnabelle, Blanchard, 89 y.o. female  Date of Admission:  01/30/2024  Date of service: 2024-02-16  Date of Birth:  Aug 09, 1934      Chief Complaint: large right MCA stroke  Pt's condition today: stable    Subjective:  NAEO.  All questions answered at bedside with family.    Vital Signs:  Temp (24hrs) Max:38.7 C (101.7 F)      Systolic (24hrs), Avg:133 , Min:133 , Max:133     Diastolic (24hrs), Avg:49, Min:49, Max:49    Temp  Avg: 37.9 C (100.2 F)  Min: 36.7 C (98.1 F)  Max: 38.7 C (101.7 F)  MAP (Non-Invasive)  Avg: 72 mmHG  Min: 72 mmHG  Max: 72 mmHG  Pulse  Avg: 90  Min: 90  Max: 90  Resp  Avg: 25  Min: 22  Max: 28  SpO2  Avg: 70 %  Min: 70 %  Max: 70 %       Today's Physical Exam:  General: Patient resting in bed with eyes closed, no apparent distress, warm to touch    Current Medications:  acetaminophen  (TYLENOL ) rectal suppository, 650 mg, Rectal, Q4H PRN  glycopyrrolate  (ROBINUL ) 0.2 mg/mL (200 mcg/mL) injection, 200 mcg, Intravenous, Q4H PRN  glycopyrrolate  (ROBINUL ) 0.2 mg/mL (200 mcg/mL) injection, 200 mcg, Intravenous, Q4HRS  midazolam  (VERSED ) 1 mg/mL injection, 2 mg, Intravenous, Q1H PRN  morphine  2 mg/mL injection, 2 mg, Intravenous, Q20 Min PRN  morphine  4 mg/mL injection, 6 mg, Intravenous, Q4HRS  scopolamine  1 mg over 3 days transdermal patch, 1 Patch, Transdermal, Q72H        I/O:  I/O last 24 hours:    Intake/Output Summary (Last 24 hours) at 16-Feb-2024 0704  Last data filed at 02/03/2024 2200  Gross per 24 hour   Intake --   Output 450 ml   Net -450 ml     I/O current shift:  No intake/output data recorded.    Labs  Please indicate ordered or reviewed)  Reviewed: I have reviewed all lab results.    Review of reports and notes reveal:     Independent Interpretation of images or specimens:  None to review    Patient/ Family Discussion: Updated    Assessment/Plan:  Active Hospital Problems    Diagnosis    Primary Problem: Acute ischemic right MCA  stroke (CMS [facility])    Palliative care by specialist    Left hemiparesis (CMS HCC)    S/P total left hip arthroplasty    Gait abnormality     Ms. Makana Feigel Perezperez is a 89 y.o. female who was currently admitted to [facility], s/p left hip replacement.  She has a past medical history of dementia, hypertension, hyperlipidemia, hypothyroidism, and severe right carotid stenosis/occlusion for which she follows with vascular surgery.  She also has had a right cerebellar infarct in 09/2022. This morning she had an acute neurological change for which code stroke was activated. Patient developed acute onset left sided weakness and encephalopathy and telestroke was performed with Dr. Hildegard. CT stroke protocol performed at [facility] showed severe stenosis of the right ICA origin, multifocal thrombus with severe narrowing in the right cavernous and supraclinoid ICA, multifocal thrombus in the right MCA M1 segment with near complete occlusion, and occlusion at the right ACA origin with prompt reconstitution of the right A1 segment. There is mismatch perfusion defect in the right MCA territory. She was taking Plavix  and rosuvastatin  20 mg  PTA. At baseline she is ambulatory and has some mild cognitive changes, including memory loss. Patient was transferred to [facility] for mechanical thrombectomy and was taken directly to IR by neurology stroke team and [facility].  Patient underwent mechanical thrombectomy of right ICA/T occlusion with acute right ICA stenting with TICI3 recan.  Despite intervention, patient had large right MCA stroke with eventual hemorrhage in the stroke bed.  Patient and family offered decompressive crani however they declined and transitioned to comfort measures only.    Large right MCA stroke s/p MT and stenting to right ICA  - Large stroke with decline in neuro exam  - Patient transitioned to CMO and extubated after discussion with family  - Continue mouth care and turns  - PRN Morphine , Versed , Robinul , and  tylenol   - Will schedule morphine  and robinul , morphine  increased to 6 mg IV Q4 hours  - Foley  - Supportive care following, appreciate assistance  - Music therapy    Labs/Diagnostic studies ordered: None    DVT/PE Prophylaxis: NA      Ivonne Pepper, MD  PGY-3 Neurology        I saw and examined the patient.  I reviewed the resident's note.  I agree with the findings and plan of care as documented in the resident's note.  Any exceptions/additions are edited/noted.    Talbot Monarch, MD

## 2024-02-27 NOTE — Expiration Summary (Signed)
 EXPIRATION SUMMARY        PATIENT NAME:  [name], [name]   MRN:  [id number]  DOB:  October 18, 1934    ADMISSION DATE:  01/30/2024  EXPIRATION DATE:      PRIMARY CARE PHYSICIAN: Deward Squibb, MD     ADMISSION DIAGNOSIS:  Acute ischemic right MCA stroke (CMS [facility]) [I63.511]    FINAL DIAGNOSIS:  Large right acute ischemic stroke    Principle Problem:  Acute ischemic right MCA stroke (CMS [facility])    Active Hospital Problems    Diagnosis Date Noted    Principal Problem: Acute ischemic right MCA stroke (CMS [facility]) [I63.511] 01/30/2024    Palliative care by specialist [Z51.5] 02/03/2024    Left hemiparesis (CMS HCC) [G81.94] 01/31/2024    S/P total left hip arthroplasty [Z96.642] 01/31/2024    Gait abnormality [R26.9] 01/31/2024      Resolved Hospital Problems   No resolved problems to display.     Active Non-Hospital Problems    Diagnosis Date Noted    Closed left hip fracture 01/28/2024    Right cavernous carotid stenosis 10/23/2022    Hypertension, unspecified type 10/23/2022    Hyperlipidemia, unspecified hyperlipidemia type 10/23/2022    Acute ischemic stroke (CMS HCC) 10/23/2022    Cognitive impairment 10/23/2022    Cerebrovascular accident (CVA), unspecified mechanism (CMS HCC) 10/22/2022        Code status prior to expiration: CMO/DNR    REASON FOR HOSPITALIZATION AND HOSPITAL COURSE:  [name] is a 89 y.o. female who was currently admitted to [facility], s/p left hip replacement.  She has a past medical history of dementia, hypertension, hyperlipidemia, hypothyroidism, and severe right carotid stenosis/occlusion for which she follows with vascular surgery.  She also has had a right cerebellar infarct in 09/2022. This morning she had an acute neurological change for which code stroke was activated. Patient developed acute onset left sided weakness and encephalopathy and telestroke was performed with Dr. Hildegard. CT stroke protocol performed at [facility] showed  severe stenosis of the right ICA origin, multifocal thrombus with severe narrowing in the right cavernous and supraclinoid ICA, multifocal thrombus in the right MCA M1 segment with near complete occlusion, and occlusion at the right ACA origin with prompt reconstitution of the right A1 segment. There is mismatch perfusion defect in the right MCA territory. She was taking Plavix  and rosuvastatin  20 mg PTA. At baseline she is ambulatory and has some mild cognitive changes, including memory loss. Patient was transferred to [facility] for mechanical thrombectomy and was taken directly to IR by neurology stroke team and [facility].  Patient underwent mechanical thrombectomy of right ICA/T occlusion with acute right ICA stenting with TICI3 recan.  Despite intervention, patient had large right MCA stroke with eventual hemorrhage in the stroke bed.  Patient and family offered decompressive crani however they declined and transitioned to comfort measures only.  Symptoms managed with PRN versed  and Morphine .  Patient passed away on 2024-02-16 at 9:59 PM.    cc: Primary Care Physician:  Deward Squibb, MD  [address]  [place] [address]    rr:[id number] Physician:  Glenda Meter, MD  [address]  [address]  [place],  [place] [address]  [name], APRN, CNP

## 2024-02-27 NOTE — Nurses Notes (Signed)
 02/03/24 1945   Pain   Pain Assessment Assessment   Pain Scale Used AD   PAINAD Breathing 0-->normal   PAINAD Negative Vocalization 0-->none   PAINAD Facial Expression 0-->smiling or inexpressive   PAINAD Body Language 0-->relaxed   PAINAD Consolability 0-->no need to console   PAINAD Score 0

## 2024-02-27 NOTE — Nurses Notes (Signed)
 Core department contacted, Referral number given by [facility] as [phone]

## 2024-02-27 NOTE — Consults (Signed)
 [facility]  Supportive Care Medicine Consult  Follow Up Note    Bridget, [name], 89 y.o. female  Date of Service: February 13, 2024  Date of Birth:  09/28/1934    Hospital Day:  LOS: 5 days     Assessment/Recommendations:  encounter for palliative care for adult failure to thrive secondary to right MCA stroke s/p thrombectomy with hemorrhagic conversion, mass effect, and midline shift     Cont scheduled morphine  to 6 mg IV q.4 hours   Cont prn morphine  interval to 2 mg IV Q 20 minutes PRN.  If no improvement in symptoms, can double to 4 mg  Continue midazolam  2 mg q.1h PRN  Continue glycopyrrolate  200 mcg q.4 hours PRN for secretions   Will order IV acetaminophen  due to challenges of rectal administration in this patient   Does not meet GIP criteria at this time, we will continue to follow    Subjective:  Patient seen and examined at bedside.  Her husband and 2 of her children were present.  Scheduled morphine  has been effective. PRN (received 2 doses yesterday).     Objective:  ,Temp:  [36.7 C (98.1 F)-38.7 C (101.7 F)] 36.7 C (98.1 F) 02/13/24 0640)  HR:  [90] 90 02-13-2024 0546)  RR:  [22] 22 02-13-2024 0541)  BP: (133)/(49) 133/49 02/13/24 0546)  SpO2:  [70 %] 70 % 02-13-2024 0541)      Constitutional: Acutely and chronically ill-appearing.  Respiratory:  Mildly labored , comfortable rate   Cardiovascular:  appears well perfused   Skin:  Warm, slightly diaphoretic  Neuro/Psych:  not responsive     Labs    Basic Metabolic Profile    Lab Results   Component Value Date/Time    SODIUM 135 (L) 02/01/2024 12:17 AM    POTASSIUM 3.9 02/01/2024 12:17 AM    CHLORIDE 107 02/01/2024 12:17 AM    CO2 24 02/01/2024 12:17 AM    ANIONGAP 4 02/01/2024 12:17 AM    Lab Results   Component Value Date/Time    BUN 22 02/01/2024 12:17 AM    CREATININE 0.79 02/01/2024 12:17 AM        CBC  Diff   Lab Results   Component Value Date/Time    WBC 11.0 02/01/2024 12:17 AM    HGB 8.4 (L) 02/01/2024 12:17 AM    HCT 25.9 (L) 02/01/2024 12:17 AM     PLTCNT 170 02/01/2024 12:17 AM    RBC 3.04 (L) 02/01/2024 12:17 AM    MCV 85.2 02/01/2024 12:17 AM    MCHC 32.4 02/01/2024 12:17 AM    MCH 27.6 02/01/2024 12:17 AM    MPV 10.2 02/01/2024 12:17 AM    Lab Results   Component Value Date/Time    PMNS 83.2 01/30/2024 06:39 AM    MONOCYTES 9.1 01/30/2024 06:39 AM    BASOPHILS 0.2 01/30/2024 06:39 AM    BASOPHILS <0.10 01/30/2024 06:39 AM    PMNABS 10.21 (H) 01/30/2024 06:39 AM    LYMPHSABS 0.82 (L) 01/30/2024 06:39 AM    EOSABS <0.10 01/30/2024 06:39 AM    MONOSABS 1.12 (H) 01/30/2024 06:39 AM          Hepatic Function    Lab Results   Component Value Date/Time    ALBUMIN 3.7 01/28/2024 06:59 PM    TOTALPROTEIN 7.2 01/28/2024 06:59 PM    ALKPHOS 86 01/28/2024 06:59 PM    PROTHROMTME 11.0 01/28/2024 06:59 PM    INR 1.06 01/28/2024 06:59 PM    Lab  Results   Component Value Date/Time    AST 28 01/30/2024 11:38 AM    ALT 9 01/30/2024 11:38 AM        Imaging  CT BRAIN WO IV CONTRAST  Result Date: 02/01/2024  Impression 1. Large, evolving right MCA territorial infarct with worsening hemorrhagic transformation with enlargement of the parenchymal hematoma centered in the right basal ganglia and new additional scattered foci of small parenchymal hematomas and petechial hemorrhage. 2. Worsening mass effect with increase effacement of the right lateral ventricle, as well as distortion is dilation of the left lateral ventricle temporal horns, concerning for entrapment. 3. Worsened leftward midline shift measuring approximately 1.1 cm (previously 0.5 cm) A Critical Red actionable finding has been sent via the [employer] Actionable Findings application on 02/01/2024 6:39 AM, Message ID [id number]. Receipt of this communication will be communicated to Radiology Concierge Service or responsible provider and will be documented in Power[employer] Actionable Findings System upon receiving the acknowledgement.    XR PELVIS CENTERED LOW  Result Date: 01/31/2024  Impression Stable left hip  hemiarthroplasty without evidence for complication.    XR FEMUR LEFT  Result Date: 01/31/2024  Impression Stable left hip hemiarthroplasty without evidence for complication.    CT BRAIN WO IV CONTRAST  Result Date: 01/31/2024  Impression 1. Evolving large right MCA territory infarct with new focus of hemorrhage within the right insula in comparison to 01/30/2024. Stable mild leftward midline shift. 2. Other stable petechial hemorrhage. A Critical Red actionable finding has been sent via the Power[employer] Actionable Findings application on 01/31/2024 6:19 AM, Message ID [id number]. Receipt of this communication will be communicated to Radiology Concierge Service or responsible provider and will be documented in Power[employer] Actionable Findings System upon receiving the acknowledgement.     MRI BRAIN W/WO CONTRAST  Result Date: 01/30/2024  Impression 1.  Acute infarct involving the right middle cerebral artery territory (principally the superior division) involving the right basal ganglia, right insula, and right frontal lobe. There is predominantly petechial type hemorrhage within the infarcted territory. There is mild right to left midline shift at the level the foramen of [place].     XR ABD X-RAY CHECK DOBHOFF PLACEMENT  Result Date: 01/30/2024  Impression Enteric tube looped within the gastric lumen, with the tip directed cranially at the level of the gastric fundus/cardia. Repositioning may be of benefit.    CT STROKE PROTOCOL (CTA HEAD/NECK WO/W)  Result Date: 01/30/2024  Impression 1. Diminutive right ICA with severely asymmetrically diminished flow within the right MCA territory. 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 Recommend MRI for further evaluation. 2. Groundglass opacities within the bilateral lung apices. This may represent developing infectious or inflammatory process. Radiologist location ID: [id number] Neuroradiology revision: There is severe stenosis of the right ICA origin. The remaining right ICA is  diminutive in caliber. There is multifocal thrombus with severe narrowing in the right cavernous and supraclinoid ICA. There is multifocal thrombus in the right MCA M1 segment with near complete occlusion. There is occlusion at the right ACA origin with prompt reconstitution of the right A1 segment. There is mismatch perfusion defect in the right MCA territory. A Critical Red actionable finding has been sent via the Power[employer] Actionable Findings application on 01/30/2024 7:39 AM, Message ID [id number]. Receipt of this communication will be communicated to [facility] RADIOLOGY STAFF or responsible provider and will be documented in PowerConnect Actionable Findings System upon receiving the acknowledgement. Radiologist location ID: [id number]     CT BRAIN WO IV CONTRAST -  POSSIBLE STROKE  Result Date: 01/30/2024  Impression 1. Diminutive right ICA with severely asymmetrically diminished flow within the right MCA territory. 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 Recommend MRI for further evaluation. 2. Groundglass opacities within the bilateral lung apices. This may represent developing infectious or inflammatory process. Radiologist location ID: [id number] Neuroradiology revision: There is severe stenosis of the right ICA origin. The remaining right ICA is diminutive in caliber. There is multifocal thrombus with severe narrowing in the right cavernous and supraclinoid ICA. There is multifocal thrombus in the right MCA M1 segment with near complete occlusion. There is occlusion at the right ACA origin with prompt reconstitution of the right A1 segment. There is mismatch perfusion defect in the right MCA territory. A Critical Red actionable finding has been sent via the Power[employer] Actionable Findings application on 01/30/2024 7:39 AM, Message ID [id number]. Receipt of this communication will be communicated to [facility] RADIOLOGY STAFF or responsible provider and will be documented in [employer] Actionable Findings  System upon receiving the acknowledgement. Radiologist location ID: [id number]     XR LOW PELVIS W LEFT LATERAL HIP  Result Date: 01/29/2024  Impression Expected acute postoperative changes from left hip arthroplasty. Radiologist location ID: [id number]     CT CERVICAL SPINE WO IV CONTRAST  Result Date: 01/28/2024  Impression No acute fracture or traumatic subluxation. Radiologist location ID: [id number]     CT CHEST ABDOMEN PELVIS W IV CONTRAST  Result Date: 01/28/2024  Impression Mildly impacted subcapital left femoral neck fracture. No other intrathoracic or intra-abdominal traumatic injuries are seen. Hepatic steatosis. Renal cyst, Bosniak 1/Bosniak 2 cyst. There is a small amount of fluid seen in the endometrial cavity, recommend nonemergent pelvic ultrasound. Scattered thyroid  nodules, if further workup is indicated outpatient ultrasound of the thyroid  can be performed. 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 Radiologist location ID: [id number]     CT BRAIN WO IV CONTRAST  Result Date: 01/28/2024  Impression Chronic changes without acute intracranial abnormality. Radiologist location ID: [id number]     XR HIP LEFT W PELVIS 2-3 VIEWS  Result Date: 01/28/2024  Impression Displaced left femoral neck fracture. Radiologist location ID: [id number]     XR FOOT LEFT 2 VIEW  Result Date: 01/28/2024  Impression Suspect nondisplaced fracture at the distal second proximal phalanx, extending to the PIP joint. Radiologist location ID: [id number]     XR KNEE LEFT 4 OR MORE VIEWS  Result Date: 01/28/2024  Impression Degenerative changes of the knee without acute bony abnormality. Radiologist location ID: [id number]     XR FEMUR LEFT  Result Date: 01/28/2024  Impression Displaced left femoral neck fracture. Radiologist location ID: [id number]     XR CHEST AP  Result Date: 01/28/2024  Impression Clear lungs, no cardiomegaly and the lung volumes are normal. Radiologist location  ID: [id number]     Camellia Sand, MD 2024-02-22 10:16   PGY-4 Hospice&Palliative Medicine   Pager #[phone]       02-22-24  I saw and examined the patient.  I reviewed the fellow's note.  I agree with the findings and plan of care as documented in the fellow's note.  Any exceptions/additions are edited/noted.    Slater Pear, MD

## 2024-02-27 NOTE — Nurses Notes (Signed)
 Pt was observed non breathing, no pulse, non responsive to voice/touch at 8:55 Pm, her daughter and son in law at her bedside, hospitalist and attending informed.

## 2024-02-27 NOTE — Nurses Notes (Signed)
 02/20/2024 0000   Pain   Pain Assessment Assessment   Pain Scale Used AD   PAINAD Breathing 0-->normal   PAINAD Negative Vocalization 0-->none   PAINAD Facial Expression 0-->smiling or inexpressive   PAINAD Body Language 0-->relaxed   PAINAD Consolability 0-->no need to console   PAINAD Score 0

## 2024-02-27 NOTE — Care Plan (Signed)
 Shift Summary  Pain remained well controlled throughout the shift, with no changes in PAINAD assessment scores or need for additional interventions.   Pressure reduction techniques and devices were consistently applied every two hours, and skin integrity was maintained with no new concerns documented.   Bedrest and head of bed elevation were maintained as ordered, supporting overall stability.   Sleep/rest enhancement and skin care interventions were performed as scheduled.   Overall, pain and skin integrity goals were supported and maintained during the shift.    Optimal Pain Control and Function: Pain assessments using PAINAD scale remained at 0 for breathing, facial expression, consolability, and negative vocalization throughout the shift, with no changes noted in pain status or interventions required.    Skin Health and Integrity: Pressure reduction techniques and devices were consistently utilized every two hours, and skin integrity was maintained with no documented deterioration; surgical incision noted but no new concerns reported.    Goal Outcome Evaluation:     Anxieties, Fears or Concerns: Not observed (02/03/24 1945)  Individualized Care Needs: Comfort measures (02/03/24 1945)  Patient-Specific Goals (Include Timeframe): Pt remain comfortable during hospitalization (02/03/24 1945)  Plan of Care Reviewed With: patient (02/03/24 1945)     Patient Progress: improving

## 2024-02-27 NOTE — Nurses Notes (Signed)
 03-02-24 0546   Pain   Pain Assessment Assessment   Rationale for Non-Scoring Patient resting - eyes closed and respirations WDL   Pain Scale Used AD   PAINAD Breathing 0-->normal   PAINAD Negative Vocalization 0-->none   PAINAD Facial Expression 0-->smiling or inexpressive   PAINAD Body Language 0-->relaxed   PAINAD Consolability 0-->no need to console   PAINAD Score 0

## 2024-02-27 NOTE — Nurses Notes (Addendum)
 9083- RN to bedside to administer scheduled meds. RR 22. No other s/s of pain or discomfort. Family agreeable to wait until meds kick in to start turns. Will continue to monitor.     1050- RN to bedside to assess pt. RR 25. T 101.5 Axillary. Family starting to have some gargling per family. PRNs given. Will continue to monitor.     1104- RN to bedside to assess pt. RR 26. PRN given. Will continue to monitor.     1246- RN to bedside to assess pt. RR 24. Scheduled meds given. Will continue to monitor.     37- RN to bedside to assess pt. RR 26. PRN given. Will continue to monitor.     49- RN to bedside to reassess pain. RR 29. PRN given. Will continue to monitor.     1631- RN to bedside to reassess pain. RR 29. PRN given.  Will continue to monitor. Reaching out to supportive care.     1700- PCA set up per order. Will continue to monitor pt.     1709- Pt with increased WOB and RR 25. PRN given. Will continue to monitor.     1853- Pt with increased WOB and RR 23. PRN given. Will continue to monitor.

## 2024-02-27 NOTE — Care Management Notes (Signed)
 [facility]  Care Management Note    Patient Name: Bridget [name] [facility]  Date of Birth: 01/28/1934  Sex: female  Date/Time of Admission: 01/30/2024  9:54 AM  Room/Bed: 729/A  Payor: AETNA-MEDI-ADV / Plan: AETNA MEDICARE ADVANTAGE (MC) / Product Type: MEDICARE MC /    LOS: 5 days   Primary Care Providers:  Clarisse Mt, MD, MD (General)    Admitting Diagnosis:  Acute ischemic right MCA stroke (CMS [facility]) [I63.511]    Assessment:      07-Feb-2024 1021   Assessment Details   Assessment Type Continued Assessment   Date of Care Management Update 2024/02/07   Date of Next DCP Update 02/07/24   Care Management Plan   Discharge Planning Status plan in progress   Projected Discharge Date 02/07/24   CM will evaluate for rehabilitation potential no   Discharge Needs Assessment   Discharge Facility/Level of Care Needs Comfort Measures         Discharge Plan:  Comfort Measures  Per chart review, pt is CMO. Supportive Care following, and pt remains on scheduled and PRN IV medications.     The patient will continue to be evaluated for developing discharge needs.     Case Manager: Comer CHRISTELLA Requena, SOCIAL WORKER  Phone: [phone]

## 2024-02-27 NOTE — Progress Notes (Signed)
 Death Pronouncement Note      Name: Bridget [name] [facility] Date of Death: 02-12-2024   MRN: [id number] Time of Death: 9:59PM   Date: 12-Feb-2024 Preliminary Cause of Death: R MCA stroke complicated by hemorraghic conversion         Contacted by nursing after vital signs and mental status indicate expiration. The patient was seen and examined at bedside, was found to be apneic, pulseless, and without heart sounds. Their pupils were fixed and dilated, and corneal & gag reflexes were absent. The patient was pronounced dead at 9:59 PMPMon 2024-02-12. The patient's family was at bedside. The patient's family was offered autopsy and declined.      Medical Examiner Notification: no  Autopsy per family: no  CORE to be notified by nursing staff: no  Family present: Yes        Sebastian Quitter, MD  Resident Neurologist, [facility]  12-Feb-2024, 22:14  Pager [phone]

## 2024-02-27 DEATH — deceased

## 2024-12-19 ENCOUNTER — Ambulatory Visit (INDEPENDENT_AMBULATORY_CARE_PROVIDER_SITE_OTHER): Payer: Self-pay | Admitting: Surgery
